# Patient Record
Sex: Male | Born: 1935
Health system: Southern US, Community
[De-identification: ages and names within clinical notes are randomized; demographics above are authoritative.]

## PROBLEM LIST (undated history)

## (undated) DIAGNOSIS — E119 Type 2 diabetes mellitus without complications: Secondary | ICD-10-CM

## (undated) DIAGNOSIS — R0600 Dyspnea, unspecified: Secondary | ICD-10-CM

## (undated) DIAGNOSIS — C189 Malignant neoplasm of colon, unspecified: Secondary | ICD-10-CM

## (undated) DIAGNOSIS — M109 Gout, unspecified: Secondary | ICD-10-CM

## (undated) DIAGNOSIS — E7800 Pure hypercholesterolemia, unspecified: Secondary | ICD-10-CM

## (undated) DIAGNOSIS — M1990 Unspecified osteoarthritis, unspecified site: Secondary | ICD-10-CM

## (undated) DIAGNOSIS — R262 Difficulty in walking, not elsewhere classified: Secondary | ICD-10-CM

## (undated) DIAGNOSIS — C61 Malignant neoplasm of prostate: Secondary | ICD-10-CM

## (undated) DIAGNOSIS — D649 Anemia, unspecified: Secondary | ICD-10-CM

## (undated) DIAGNOSIS — Z9289 Personal history of other medical treatment: Secondary | ICD-10-CM

## (undated) DIAGNOSIS — K922 Gastrointestinal hemorrhage, unspecified: Secondary | ICD-10-CM

## (undated) DIAGNOSIS — L97509 Non-pressure chronic ulcer of other part of unspecified foot with unspecified severity: Secondary | ICD-10-CM

## (undated) DIAGNOSIS — I10 Essential (primary) hypertension: Secondary | ICD-10-CM

## (undated) HISTORY — PX: PROSTATE BIOPSY: SHX241

## (undated) HISTORY — DX: Essential (primary) hypertension: I10

## (undated) HISTORY — PX: JOINT REPLACEMENT: SHX530

## (undated) HISTORY — PX: COLECTOMY: SHX59

## (undated) HISTORY — DX: Difficulty in walking, not elsewhere classified: R26.2

## (undated) HISTORY — DX: Non-pressure chronic ulcer of other part of unspecified foot with unspecified severity: L97.509

## (undated) HISTORY — DX: Gout, unspecified: M10.9

## (undated) HISTORY — PX: CATARACT EXTRACTION W/ INTRAOCULAR LENS  IMPLANT, BILATERAL: SHX1307

---

## 2000-08-23 ENCOUNTER — Ambulatory Visit (HOSPITAL_COMMUNITY): Admission: RE | Admit: 2000-08-23 | Discharge: 2000-08-23 | Payer: Self-pay | Admitting: Family Medicine

## 2000-08-23 ENCOUNTER — Encounter: Payer: Self-pay | Admitting: Family Medicine

## 2001-05-29 ENCOUNTER — Ambulatory Visit (HOSPITAL_COMMUNITY): Admission: RE | Admit: 2001-05-29 | Discharge: 2001-05-29 | Payer: Self-pay | Admitting: Gastroenterology

## 2001-05-30 ENCOUNTER — Ambulatory Visit (HOSPITAL_COMMUNITY): Admission: RE | Admit: 2001-05-30 | Discharge: 2001-05-30 | Payer: Self-pay | Admitting: Gastroenterology

## 2001-05-30 ENCOUNTER — Encounter: Payer: Self-pay | Admitting: Gastroenterology

## 2002-01-21 ENCOUNTER — Encounter: Payer: Self-pay | Admitting: Family Medicine

## 2002-01-21 ENCOUNTER — Encounter: Admission: RE | Admit: 2002-01-21 | Discharge: 2002-01-21 | Payer: Self-pay | Admitting: Family Medicine

## 2002-01-23 ENCOUNTER — Encounter (HOSPITAL_COMMUNITY): Admission: RE | Admit: 2002-01-23 | Discharge: 2002-04-23 | Payer: Self-pay | Admitting: Family Medicine

## 2002-01-24 ENCOUNTER — Encounter: Payer: Self-pay | Admitting: Gastroenterology

## 2002-01-24 ENCOUNTER — Ambulatory Visit (HOSPITAL_COMMUNITY): Admission: RE | Admit: 2002-01-24 | Discharge: 2002-01-24 | Payer: Self-pay | Admitting: Gastroenterology

## 2002-02-07 ENCOUNTER — Inpatient Hospital Stay (HOSPITAL_COMMUNITY): Admission: RE | Admit: 2002-02-07 | Discharge: 2002-02-14 | Payer: Self-pay | Admitting: General Surgery

## 2002-02-07 ENCOUNTER — Encounter (INDEPENDENT_AMBULATORY_CARE_PROVIDER_SITE_OTHER): Payer: Self-pay | Admitting: *Deleted

## 2002-02-13 ENCOUNTER — Encounter: Payer: Self-pay | Admitting: General Surgery

## 2002-03-10 ENCOUNTER — Encounter: Payer: Self-pay | Admitting: General Surgery

## 2002-03-10 ENCOUNTER — Ambulatory Visit (HOSPITAL_BASED_OUTPATIENT_CLINIC_OR_DEPARTMENT_OTHER): Admission: RE | Admit: 2002-03-10 | Discharge: 2002-03-10 | Payer: Self-pay | Admitting: General Surgery

## 2002-03-16 ENCOUNTER — Emergency Department (HOSPITAL_COMMUNITY): Admission: EM | Admit: 2002-03-16 | Discharge: 2002-03-16 | Payer: Self-pay | Admitting: Emergency Medicine

## 2002-04-06 ENCOUNTER — Emergency Department (HOSPITAL_COMMUNITY): Admission: EM | Admit: 2002-04-06 | Discharge: 2002-04-06 | Payer: Self-pay | Admitting: Emergency Medicine

## 2002-04-06 ENCOUNTER — Encounter: Payer: Self-pay | Admitting: Emergency Medicine

## 2002-04-09 ENCOUNTER — Encounter: Payer: Self-pay | Admitting: Hematology & Oncology

## 2002-04-09 ENCOUNTER — Inpatient Hospital Stay (HOSPITAL_COMMUNITY): Admission: RE | Admit: 2002-04-09 | Discharge: 2002-04-12 | Payer: Self-pay | Admitting: Hematology & Oncology

## 2002-04-12 ENCOUNTER — Encounter: Payer: Self-pay | Admitting: Oncology

## 2002-08-25 ENCOUNTER — Encounter: Payer: Self-pay | Admitting: Hematology & Oncology

## 2002-08-25 ENCOUNTER — Ambulatory Visit (HOSPITAL_COMMUNITY): Admission: RE | Admit: 2002-08-25 | Discharge: 2002-08-25 | Payer: Self-pay | Admitting: Hematology & Oncology

## 2002-12-03 ENCOUNTER — Encounter: Payer: Self-pay | Admitting: Hematology & Oncology

## 2002-12-03 ENCOUNTER — Ambulatory Visit (HOSPITAL_COMMUNITY): Admission: RE | Admit: 2002-12-03 | Discharge: 2002-12-03 | Payer: Self-pay | Admitting: Hematology & Oncology

## 2003-01-28 ENCOUNTER — Ambulatory Visit (HOSPITAL_COMMUNITY): Admission: RE | Admit: 2003-01-28 | Discharge: 2003-01-28 | Payer: Self-pay | Admitting: Gastroenterology

## 2003-02-11 ENCOUNTER — Ambulatory Visit (HOSPITAL_COMMUNITY): Admission: RE | Admit: 2003-02-11 | Discharge: 2003-02-11 | Payer: Self-pay | Admitting: Hematology & Oncology

## 2003-02-11 ENCOUNTER — Encounter: Payer: Self-pay | Admitting: Hematology & Oncology

## 2003-04-03 ENCOUNTER — Encounter: Payer: Self-pay | Admitting: Hematology & Oncology

## 2003-04-03 ENCOUNTER — Ambulatory Visit (HOSPITAL_COMMUNITY): Admission: RE | Admit: 2003-04-03 | Discharge: 2003-04-03 | Payer: Self-pay | Admitting: Hematology & Oncology

## 2003-04-12 ENCOUNTER — Encounter: Payer: Self-pay | Admitting: *Deleted

## 2003-04-12 ENCOUNTER — Inpatient Hospital Stay (HOSPITAL_COMMUNITY): Admission: EM | Admit: 2003-04-12 | Discharge: 2003-04-15 | Payer: Self-pay | Admitting: Emergency Medicine

## 2003-04-13 ENCOUNTER — Encounter: Payer: Self-pay | Admitting: *Deleted

## 2003-04-13 ENCOUNTER — Encounter (INDEPENDENT_AMBULATORY_CARE_PROVIDER_SITE_OTHER): Payer: Self-pay | Admitting: Cardiology

## 2003-04-30 ENCOUNTER — Ambulatory Visit (HOSPITAL_COMMUNITY): Admission: RE | Admit: 2003-04-30 | Discharge: 2003-04-30 | Payer: Self-pay | Admitting: Hematology & Oncology

## 2003-04-30 ENCOUNTER — Encounter: Payer: Self-pay | Admitting: Hematology & Oncology

## 2003-08-04 ENCOUNTER — Ambulatory Visit (HOSPITAL_COMMUNITY): Admission: RE | Admit: 2003-08-04 | Discharge: 2003-08-04 | Payer: Self-pay | Admitting: Hematology & Oncology

## 2003-12-03 ENCOUNTER — Ambulatory Visit (HOSPITAL_COMMUNITY): Admission: RE | Admit: 2003-12-03 | Discharge: 2003-12-03 | Payer: Self-pay | Admitting: Hematology & Oncology

## 2004-04-05 ENCOUNTER — Ambulatory Visit: Admission: RE | Admit: 2004-04-05 | Discharge: 2004-05-02 | Payer: Self-pay | Admitting: Radiation Oncology

## 2004-04-13 ENCOUNTER — Ambulatory Visit (HOSPITAL_COMMUNITY): Admission: RE | Admit: 2004-04-13 | Discharge: 2004-04-13 | Payer: Self-pay | Admitting: Hematology & Oncology

## 2004-05-12 ENCOUNTER — Emergency Department (HOSPITAL_COMMUNITY): Admission: EM | Admit: 2004-05-12 | Discharge: 2004-05-12 | Payer: Self-pay | Admitting: Emergency Medicine

## 2004-08-03 ENCOUNTER — Ambulatory Visit (HOSPITAL_COMMUNITY): Admission: RE | Admit: 2004-08-03 | Discharge: 2004-08-03 | Payer: Self-pay | Admitting: Hematology & Oncology

## 2004-08-10 ENCOUNTER — Ambulatory Visit: Payer: Self-pay | Admitting: Hematology & Oncology

## 2004-08-24 ENCOUNTER — Ambulatory Visit: Admission: RE | Admit: 2004-08-24 | Discharge: 2004-11-22 | Payer: Self-pay | Admitting: Radiation Oncology

## 2004-11-21 ENCOUNTER — Ambulatory Visit: Payer: Self-pay | Admitting: Hematology & Oncology

## 2004-11-22 ENCOUNTER — Ambulatory Visit (HOSPITAL_COMMUNITY): Admission: RE | Admit: 2004-11-22 | Discharge: 2004-11-22 | Payer: Self-pay | Admitting: Oncology

## 2004-11-23 ENCOUNTER — Ambulatory Visit (HOSPITAL_COMMUNITY): Admission: RE | Admit: 2004-11-23 | Discharge: 2004-11-23 | Payer: Self-pay | Admitting: Oncology

## 2004-12-15 ENCOUNTER — Ambulatory Visit: Admission: RE | Admit: 2004-12-15 | Discharge: 2004-12-15 | Payer: Self-pay | Admitting: Radiation Oncology

## 2005-03-06 ENCOUNTER — Encounter: Admission: RE | Admit: 2005-03-06 | Discharge: 2005-03-06 | Payer: Self-pay | Admitting: Family Medicine

## 2005-03-27 ENCOUNTER — Ambulatory Visit: Payer: Self-pay | Admitting: Hematology & Oncology

## 2005-04-04 ENCOUNTER — Ambulatory Visit (HOSPITAL_COMMUNITY): Admission: RE | Admit: 2005-04-04 | Discharge: 2005-04-04 | Payer: Self-pay | Admitting: Hematology & Oncology

## 2005-08-08 ENCOUNTER — Ambulatory Visit: Payer: Self-pay | Admitting: Hematology & Oncology

## 2005-08-11 ENCOUNTER — Ambulatory Visit (HOSPITAL_COMMUNITY): Admission: RE | Admit: 2005-08-11 | Discharge: 2005-08-11 | Payer: Self-pay | Admitting: Hematology & Oncology

## 2005-12-22 ENCOUNTER — Ambulatory Visit: Payer: Self-pay | Admitting: Hematology & Oncology

## 2005-12-26 ENCOUNTER — Ambulatory Visit (HOSPITAL_COMMUNITY): Admission: RE | Admit: 2005-12-26 | Discharge: 2005-12-26 | Payer: Self-pay | Admitting: Hematology & Oncology

## 2006-05-02 ENCOUNTER — Ambulatory Visit: Payer: Self-pay | Admitting: Hematology & Oncology

## 2006-05-04 LAB — CBC WITH DIFFERENTIAL/PLATELET
BASO%: 0.6 % (ref 0.0–2.0)
EOS%: 3.8 % (ref 0.0–7.0)
LYMPH%: 15.3 % (ref 14.0–48.0)
MCH: 28.6 pg (ref 28.0–33.4)
MONO%: 8.7 % (ref 0.0–13.0)
NEUT#: 4 10*3/uL (ref 1.5–6.5)
NEUT%: 71.6 % (ref 40.0–75.0)
Platelets: 219 10*3/uL (ref 145–400)

## 2006-05-04 LAB — COMPREHENSIVE METABOLIC PANEL
Albumin: 4.2 g/dL (ref 3.5–5.2)
Alkaline Phosphatase: 75 U/L (ref 39–117)
BUN: 26 mg/dL — ABNORMAL HIGH (ref 6–23)
CO2: 21 mEq/L (ref 19–32)
Calcium: 9.1 mg/dL (ref 8.4–10.5)
Creatinine, Ser: 1.52 mg/dL — ABNORMAL HIGH (ref 0.40–1.50)
Potassium: 4.5 mEq/L (ref 3.5–5.3)
Sodium: 141 mEq/L (ref 135–145)

## 2006-08-28 ENCOUNTER — Ambulatory Visit: Payer: Self-pay | Admitting: Hematology & Oncology

## 2006-08-31 LAB — CBC WITH DIFFERENTIAL/PLATELET
EOS%: 4.1 % (ref 0.0–7.0)
Eosinophils Absolute: 0.3 10*3/uL (ref 0.0–0.5)
MCHC: 31.5 g/dL — ABNORMAL LOW (ref 32.0–35.9)
MONO#: 0.6 10*3/uL (ref 0.1–0.9)
RDW: 11.9 % (ref 11.2–14.6)
lymph#: 1.3 10*3/uL (ref 0.9–3.3)

## 2006-08-31 LAB — CEA: CEA: 2.4 ng/mL (ref 0.0–5.0)

## 2006-08-31 LAB — PSA: PSA: 0.66 ng/mL (ref 0.10–4.00)

## 2006-08-31 LAB — COMPREHENSIVE METABOLIC PANEL
AST: 16 U/L (ref 0–37)
Albumin: 4.4 g/dL (ref 3.5–5.2)
Alkaline Phosphatase: 91 U/L (ref 39–117)
CO2: 22 mEq/L (ref 19–32)
Calcium: 9.5 mg/dL (ref 8.4–10.5)
Chloride: 105 mEq/L (ref 96–112)
Potassium: 4 mEq/L (ref 3.5–5.3)
Total Bilirubin: 0.6 mg/dL (ref 0.3–1.2)

## 2006-08-31 LAB — LACTATE DEHYDROGENASE: LDH: 187 U/L (ref 94–250)

## 2007-02-27 ENCOUNTER — Ambulatory Visit: Payer: Self-pay | Admitting: Hematology & Oncology

## 2007-03-01 LAB — CBC WITH DIFFERENTIAL/PLATELET
Basophils Absolute: 0 10*3/uL (ref 0.0–0.1)
HCT: 36.1 % — ABNORMAL LOW (ref 38.7–49.9)
HGB: 12.1 g/dL — ABNORMAL LOW (ref 13.0–17.1)
MCHC: 33.5 g/dL (ref 32.0–35.9)
MCV: 84.5 fL (ref 81.6–98.0)
NEUT#: 4.2 10*3/uL (ref 1.5–6.5)
Platelets: 236 10*3/uL (ref 145–400)
RDW: 13.5 % (ref 11.2–14.6)
WBC: 6.4 10*3/uL (ref 4.0–10.0)

## 2007-03-01 LAB — COMPREHENSIVE METABOLIC PANEL
ALT: 21 U/L (ref 0–53)
AST: 14 U/L (ref 0–37)
Albumin: 4.5 g/dL (ref 3.5–5.2)
Alkaline Phosphatase: 92 U/L (ref 39–117)
BUN: 31 mg/dL — ABNORMAL HIGH (ref 6–23)
Creatinine, Ser: 1.65 mg/dL — ABNORMAL HIGH (ref 0.40–1.50)
Glucose, Bld: 202 mg/dL — ABNORMAL HIGH (ref 70–99)
Total Bilirubin: 0.7 mg/dL (ref 0.3–1.2)

## 2007-03-01 LAB — CEA: CEA: 2.7 ng/mL (ref 0.0–5.0)

## 2007-08-28 ENCOUNTER — Ambulatory Visit: Payer: Self-pay | Admitting: Hematology & Oncology

## 2007-08-30 LAB — COMPREHENSIVE METABOLIC PANEL
ALT: 25 U/L (ref 0–53)
Albumin: 4.5 g/dL (ref 3.5–5.2)
CO2: 24 mEq/L (ref 19–32)
Chloride: 106 mEq/L (ref 96–112)
Glucose, Bld: 151 mg/dL — ABNORMAL HIGH (ref 70–99)
Potassium: 4 mEq/L (ref 3.5–5.3)
Sodium: 140 mEq/L (ref 135–145)
Total Protein: 7.5 g/dL (ref 6.0–8.3)

## 2007-08-30 LAB — CBC WITH DIFFERENTIAL/PLATELET
EOS%: 3.6 % (ref 0.0–7.0)
Eosinophils Absolute: 0.2 10*3/uL (ref 0.0–0.5)
HCT: 38.8 % (ref 38.7–49.9)
HGB: 12.7 g/dL — ABNORMAL LOW (ref 13.0–17.1)
MCV: 86.1 fL (ref 81.6–98.0)
NEUT#: 4.4 10*3/uL (ref 1.5–6.5)
NEUT%: 69.3 % (ref 40.0–75.0)
RBC: 4.5 10*6/uL (ref 4.20–5.71)
WBC: 6.3 10*3/uL (ref 4.0–10.0)

## 2007-08-30 LAB — PSA: PSA: 0.41 ng/mL (ref 0.10–4.00)

## 2007-08-30 LAB — CEA: CEA: 2.2 ng/mL (ref 0.0–5.0)

## 2007-09-14 ENCOUNTER — Encounter: Admission: RE | Admit: 2007-09-14 | Discharge: 2007-09-14 | Payer: Self-pay | Admitting: Family Medicine

## 2008-04-03 ENCOUNTER — Encounter: Admission: RE | Admit: 2008-04-03 | Discharge: 2008-04-03 | Payer: Self-pay | Admitting: Family Medicine

## 2011-02-10 NOTE — Procedures (Signed)
[facility]  Patient:    Damon Snyder, Damon Snyder Visit Number: [id number] MRN: [id number]          Service Type: Attending:  Verlin Grills, M.D. Proc. Date: 05/29/01   CC:         Desma Maxim, M.D.   Procedure Report  PROCEDURE:  Colonoscopy to the hepatic flexure.  REFERRING PHYSICIAN:  Desma Maxim, M.D.  PROCEDURE INDICATION:  Mr. [name] (date of birth 05/19/2028) is a 77 year old male with Hemoccult positive stool.  Mr. Ellis is scheduled to undergo his first surveillance colonoscopy with polypectomy to prevent colon cancer.  ENDOSCOPIST:  Verlin Grills, M.D.  PREMEDICATION:  Versed 5 mg, Demerol 50 mg.  ENDOSCOPE:  Olympus pediatric colonoscope.  DESCRIPTION OF PROCEDURE:  After obtaining informed consent, Mr. Arana was placed in the left lateral decubitus position.  I administered intravenous Demerol and intravenous Versed to achieve conscious sedation for the procedure.  The patients blood pressure, oxygen saturations, and cardiac rhythm were monitored throughout the procedure and documented in the medical record.  Anal inspection was normal.  Digital rectal exam revealed a nonnodular prostate.  The Olympus pediatric video colonoscope was introduced into the rectum and easily advanced to the hepatic flexure.  Due to colonic loop formation, I was unable to perform a complete colonoscopy.  Colonic preparation for the exam today was satisfactory.  RECTUM:  Normal, large nonbleeding internal hemorrhoids are present.  SIGMOID COLON AND DESCENDING COLON:  Normal.  SPLENIC FLEXURE:  Normal.  TRANSVERSE COLON:  Normal.  HEPATIC FLEXURE:  Normal.  ASSESSMENT: 1. Large, nonbleeding, internal hemorrhoids. 2. Otherwise normal proctocolonoscopy to the hepatic flexure.  RECOMMENDATIONS:  I will schedule Mr. Hollomon for an air contrast barium enema to be performed at [facility] May 30, 2001. Attending:   Verlin Grills, M.D. DD:  05/29/01 TD:  05/29/01 Job: [id number]

## 2011-02-10 NOTE — Op Note (Signed)
   NAME:  Damon Snyder, Damon Snyder                           ACCOUNT NO.:  [id number]   MEDICAL RECORD NO.:  [id number]                   PATIENT TYPE:  INP   LOCATION:  4737                                 FACILITY:  [facility]   PHYSICIAN:  Gita Kudo, M.D.              DATE OF BIRTH:  1927-11-23   DATE OF PROCEDURE:  04/14/2003  DATE OF DISCHARGE:                                 OPERATIVE REPORT   PREOPERATIVE DIAGNOSIS:  Indwelling venous access system, used for  chemotherapy for colon cancer and longer needed.   POSTOPERATIVE DIAGNOSIS:  Indwelling venous access system, used for  chemotherapy for colon cancer and longer needed.   OPERATION PERFORMED:  Removal of indwelling venous access system--left  subclavian.   SURGEON:  Gita Kudo, M.D.\   ANESTHESIA:  MAC--IV sedation, local 1% Xylocaine.   DESCRIPTION OF PROCEDURE:  Under satisfactory intravenous sedation, the  patient was positioned and prepped and draped in a standard fashion.  30mL  of 1% Xylocaine was infiltrated during the procedure for excellent  analgesia.  The previous incision was reopened in a transverse direction and  carried down to the capsule of the reservoir.  The capsule was opened and  the reservoir grasped and tissue ingrowth through the holes and sutures  through the holes were cut and the reservoir freed.  Then the reservoir and  attached catheter were slowly withdrawn and noted to be intact.  There were  no complications.  Minimal blood loss.  The wound lavaged with saline and  closed in layers with 3-0 and 4-0 Vicryl and Steri-Strips.  Sterile  absorbent dressing applied and the patient went to the recovery room in good  condition.                                                Gita Kudo, M.D.    MRL/MEDQ  D:  04/14/2003  T:  04/14/2003  Job:  [id number]   cc:   Ermalinda Memos. Gaye Pollack, M.D.  Fax: [phone]

## 2011-02-10 NOTE — Discharge Summary (Signed)
NAME:  Damon Snyder, Damon Snyder                           ACCOUNT NO.:  [id number]   MEDICAL RECORD NO.:  [id number]                   PATIENT TYPE:  INP   LOCATION:  4737                                 FACILITY:  [facility]   PHYSICIAN:  Lilyan Punt. Sydnee Levans, M.D.             DATE OF BIRTH:  May 03, 1928   DATE OF ADMISSION:  04/12/2003  DATE OF DISCHARGE:  04/15/2003                                 DISCHARGE SUMMARY   DIAGNOSES:  1. Syncope x 2 thought secondary to dehydration and renal insufficiency     which resolved with treatment with IV fluids and stoppage of one of his     blood pressure medications: Diovan 300 mg.  2. Dehydration, resolved.  3. Renal insufficiency, resolved. Creatinine on discharge 1.2.  4. Carotid disease with carotid Dopplers 04/13/2003, that showed no     significant internal carotid artery stenosis but right-sided mild     heterogenous plaque in the carotid artery extending into the proximal     internal carotid artery and external carotid artery.  5. Diabetes, type 2.  6. Hypertension.  7. Dyslipidemia.  8. Anemia, normocytic, likely anemia of chronic disease.  9. History of colon cancer and oncologic followup.  10.      Port-A-Cath insertion with subsequent removal this admission by Dr.     Maryagnes Amos.  This procedure had originally been scheduled as an outpatient     during this time period.   MEDICATIONS:  1. Plavix 75 mg daily.  2. Aspirin 81 mg daily.  3. Norvasc 10 mg daily.  4. Vitelle daily.  5. Lotensin 20 mg daily.  6. Lipitor 10 mg daily.  7. Amaryl 6 mg daily.   DISCONTINUED MEDICATIONS:  Diovan.   FOLLOW UP:  Dr. Arvilla Market in two weeks, Dr. Myna Hidalgo in early August as  previously scheduled.  Will see Dr. Maryagnes Amos in a month.   LABORATORY DATA:  Pending tests at discharge:  Hemoglobin A1C level.   HOSPITAL COURSE:  The patient was admitted by Dr. 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  He  was found  to be hypertensive, volume depleted, with orthostatic hypotension  as well as renal insufficiency.   The patient was admitted for IV hydration. Diovan was held.  His other  medicines were continued.  He underwent head CT that showed no evidence of  acute cerebrovascular accident, bleeding, or tumor.  There was discussion of  possible mild small vessel disease in the hemispheric white matter per  radiologist report.  Chest x-ray revealed hyperaeration with bibasilar  scarring, particularly on the right but essentially no active disease.  His  carotid Dopplers were done as discussed above, and management was discussed  with Plavix and aspirin having been started as inpatient.   He had several elevated blood glucose levels initially.  These normalized as  his activity increased and  his diet was resumed.  There were no hypoglycemic  events during hospitalization.  The patient did relate having missed  breakfast on the day that he experienced syncope which raises suspicion for  hypoglycemic episode.  Hemoglobin A1C level is pending at this time.   He is discharged today, having met discharge criteria and in much improved  condition.   PROCEDURE:  04/14/2003 surgery. See also operative note by Dr. Maryagnes Amos.  Under  IV sedation, removal of Port-A-Cath with no complications.                                                 Lilyan Punt Sydnee Levans, M.D.    KCS/MEDQ  D:  04/15/2003  T:  04/15/2003  Job:  [id number]   cc:   Donia Guiles, M.D.  [address] [place]  [place] [address]  Fax: [phone]   Rose Phi. Myna Hidalgo, M.D.  [address] [facility]  [place], [place] [address]  Fax: [phone]   Gita Kudo, M.D.  [address]  [place]  [place] [address]  Fax: [phone]    cc:   Donia Guiles, M.D.  [address] [place]  [place] [address]  Fax: [phone]   Rose Phi. Myna Hidalgo, M.D.  [address] [facility]  [place], [place] [address]  Fax: [phone]   Gita Kudo, M.D.   [address]  [place]  [place] [address]  Fax: [phone]

## 2011-02-10 NOTE — Discharge Summary (Signed)
[facility]  Patient:    Damon Snyder, Damon Snyder Visit Number: [id number] MRN: [id number]          Service Type: SUR Location: 1S X009 01 Attending Physician:  Tempie Donning Dictated by:   Gita Kudo, M.D. Admit Date:  02/07/2002 Discharge Date: 02/14/2002   CC:         Desma Maxim, M.D.  Rose Phi. Myna Hidalgo, M.D.  Charolett Bumpers III, M.D.   Discharge Summary  CHIEF COMPLAINT:  Lesion.  HISTORY OF PRESENT ILLNESS:  This is a 77 year old man admitted for elective colon resection.  Because of anemia, colonoscopy was performed to the distal transverse colon and could no further.  A BE showed constricting lesion consistent with cancer.  He is given an outpatient bowel prep and comes in for operation.  Operative findings of pathology showed poorly-differentiated adenocarcinoma at the ileocecal valve extending into visceral peritoneum. Margins were free with 12/14 lymph nodes involved with metastatic disease.  LABORATORY DATA AND X-RAY FINDINGS:  Initial hemoglobin was 9.4, hematocrit 30, postoperative hemoglobin 9.5 and hematocrit 31.  CMET normal except for elevated glucose (the patient is diabetic).  His potassium postop was 6.5, but repeat showed this to be normal at 5.3.  He was B positive and required no transfusion.  CT scan postop showed no sign of metastatic disease.  The chest x-ray preop showed no acute pulmonary disease.  EKG interpreted as sinus rhythm.  HOSPITAL COURSE:  On the morning of admission, the patient underwent a right colectomy.  He was a large man and the surgery went well, although exposure was somewhat difficult.  Postoperatively, he did well.  His nasogastric and Foley catheters were removed on schedule.  He regained bowel function.  He was ambulatory and had no significant problems.  He was seen in consultation by Dr. Arlan Organ. The consultation was further oncologic evaluation and treatment.  Of note,  is the fact that a preop CEA was 65.2.  After the postop CT was obtained on postop day #6, he improved to the point and was discharged on postop day #7.  He was allowed to go home.  DIET:  Regular.  ACTIVITY:  Limited.  DISCHARGE MEDICATIONS: 1. Previous medications. 2. Vicodin for pain.  FOLLOWUP:  He will be seen by Dr. Maryagnes Amos and Dr. Myna Hidalgo as outpatient.  DISCHARGE DIAGNOSIS:  Carcinoma of right colon, stage T4 N2.  COMPLICATIONS/INFECTIONS:  None.  CONSULTATION:  Dr. Arlan Organ.  PROCEDURE:  Right colectomy.  CONDITION ON DISCHARGE:  Stable. Dictated by:   Gita Kudo, M.D. Attending Physician:  Tempie Donning DD:  02/14/02 TD:  02/17/02 Job: [id number]

## 2011-02-10 NOTE — Op Note (Signed)
   NAME:  Damon Snyder, Bushey                           ACCOUNT NO.:  [id number]   MEDICAL RECORD NO.:  [id number]                   PATIENT TYPE:  AMB   LOCATION:  ENDO                                 FACILITY:  [facility]   PHYSICIAN:  Danise Edge, M.D.                DATE OF BIRTH:  07/06/1928   DATE OF PROCEDURE:  01/28/2003  DATE OF DISCHARGE:                                 OPERATIVE REPORT   PROCEDURE:  Screening colonoscopy.   INDICATIONS:  The patient is a 77 year old male born Nov 09, 2027.  Approximately  one year ago the patient required surgery to remove an adenocarcinoma of the  cecum.  The patient is scheduled to undergo a repeat surveillance  colonoscopy with polypectomy to prevent recurrent colon cancer.   ENDOSCOPIST:  Danise Edge, M.D.   PREMEDICATION:  Versed 5 mg, Demerol 50 mg.   DESCRIPTION OF PROCEDURE:  After obtaining informed consent, the patient was  placed in the left lateral decubitus position.  I administered intravenous  Demerol and intravenous Versed to achieve conscious sedation for the  procedure.  The patient's blood pressure, oxygen saturation, and cardiac  rhythm were monitored throughout the procedure and documented in the medical  record.   Anal inspection was normal.  Digital rectal exam was normal.  The prostate  was non-nodular.  The Olympus adult colonoscope was introduced into the  rectum and advanced to the ileo-right colonic surgical anastomosis.  Colonic  preparation for the exam today was excellent.   Rectum normal.   Sigmoid colon and descending colon normal.   Splenic flexure normal.   Transverse colon normal.   Hepatic flexure normal.   Ileo-right colonic surgical anastomosis normal.    ASSESSMENT:  Normal proctocolonoscopy to the ileo-right colonic surgical  anastomosis post colon cancer surgery to remove an adenocarcinoma of the  cecum.   RECOMMENDATIONS:  Repeat colonoscopy in three years.                     Danise Edge, M.D.    MJ/MEDQ  D:  01/28/2003  T:  01/28/2003  Job:  [id number]   cc:   Gita Kudo, M.D.  [address]  [place]  [place] [address]  Fax: [phone]   Donia Guiles, M.D.  [address] [place]  [place] [address]  Fax: [phone]

## 2011-02-10 NOTE — Procedures (Signed)
[facility]  Patient:    Damon Snyder, Damon Snyder Visit Number: [id number] MRN: [id number]          Service Type: END Location: ENDO Attending Physician:  Dennison Bulla Ii Dictated by:   Verlin Grills, M.D. Proc. Date: 01/24/02 Admit Date:  01/24/2002 Discharge Date: 01/24/2002   CC:         Desma Maxim, M.D.   Procedure Report  PROCEDURE:  Esophagogastroduodenoscopy and proctocolonoscopy to the splenic flexure.  REFERRING PHYSICIAN:  Desma Maxim, M.D.  PROCEDURE INDICATION:  [name]. Tuman is a 77 year old male, born [birth year].  [name] has unexplained iron deficiency anemia.  On May 29, 2001, he underwent a proctocolonoscopy to the hepatic flexure followed by air contrast barium enema which was normal except for the presence of large but nonbleeding internal hemorrhoids.  On January 21, 2002, [name] fasting complete metabolic profile was normal. His hemoglobin was 7.4 g.  His serum ferritin was 2.  Mr. Busche received two units of packed red blood cell transfusion yesterday.  ENDOSCOPIST:  Verlin Grills, M.D.  PREMEDICATION:  Versed 10 mg, Demerol 50 mg.  ENDOSCOPE:  Olympus gastroscope and pediatric colonoscope.  PROCEDURE:  Esophagogastroduodenoscopy.  DESCRIPTION OF PROCEDURE:  After obtaining informed consent, [name] was placed in the left lateral decubitus position.  I administered intravenous Demerol and intravenous Versed to achieve conscious sedation for the procedure.  The patients blood pressure, oxygen saturation, and cardiac rhythm were monitored throughout the procedure and documented in the medical record.  The Olympus gastroscope was passed through the posterior hypopharynx into the proximal esophagus without difficulty.  The hypopharynx, larynx, and vocal cords appeared normal.  Esophagoscopy:  The proximal, mid, and lower segments of the esophagus appear normal.  Gastroscopy:   Retroflexed view of the gastric cardia and fundus reveals a patulous diaphragmatic hiatus with normal-appearing gastric fundus and cardia. The gastric body appears normal.  There was a scant amount of coffee-ground fluid in the gastric antrum without an identifiable bleeding source.  The gastric antrum and pylorus otherwise appeared normal.  Duodenoscopy:  The duodenal bulb, mid duodenum, and distal duodenum appear normal.  ASSESSMENT:  Patulous diaphragmatic hiatus; otherwise normal esophagogastroduodenoscopy.  No signs of upper gastrointestinal bleeding.  PROCEDURE:  Proctocolonoscopy to the splenic flexure.  DESCRIPTION OF PROCEDURE:  Anal inspection was normal.  Digital rectal exam was normal.  The Olympus pediatric video colonoscope was introduced into the rectum, and I could only advance the endoscope to the splenic flexure.  Due to colon loop formation, I was unable to complete a full colonoscopy.  Endoscopic appearance of the rectum, sigmoid colon, descending colon, and splenic flexure was normal.  There were no signs of lower gastrointestinal bleeding.  RECOMMENDATIONS:  [name] will be referred to the radiology suite for a second air contrast barium enema.  His air contrast barium enema last year was limited due to the presence of residual stool. Dictated by:   Verlin Grills, M.D. Attending Physician:  Dennison Bulla Ii DD:  01/24/02 TD:  01/26/02 Job: [id number]

## 2011-02-10 NOTE — H&P (Signed)
NAME:  Damon Snyder, Damon Snyder                           ACCOUNT NO.:  [id number]   MEDICAL RECORD NO.:  [id number]                   PATIENT TYPE:  INP   LOCATION:  4737                                 FACILITY:  [facility]   PHYSICIAN:  Ermalinda Memos. Gaye Pollack, M.D.            DATE OF BIRTH:  1927/10/08   DATE OF ADMISSION:  04/12/2003  DATE OF DISCHARGE:                                HISTORY & PHYSICAL   CHIEF COMPLAINT:  Syncope.   HISTORY OF PRESENT ILLNESS:  Mr. 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  He had an episode of lightheaded  diaphoresis and malaise and EMS was called.  He was evaluated but refused to  come in, got up and ambulated and felt well.  After EMS left, he had another  episode of similar symptoms, with lightheadedness, dizziness, diaphoresis,  malaise, fatigue, and some shortness of breath and then had a syncopal  episode.  His wife saw him and witnessed the entire event.  It lasted only a  few seconds.  There was no seizure activity.  He was not incontinent.  He  was brought to the emergency room.  At the time of the evaluation, he had no  complaints, felt well, had no neuro deficits.  He acknowledges that he had  the symptoms his wife had mentioned and specifically denied any chest pain  or palpitations.  He denied having any neuro deficits at the time of the  examination or at the time he awakened from his syncopal episode.   PAST MEDICAL HISTORY:  Hypertension, diabetes, colon cancer, anemia.   PAST SURGICAL HISTORY:  Colonic resection.   ALLERGIES:  No known drug allergies.   CURRENT MEDICATIONS:  1. Atenolol 30 daily.  2. Amaryl 6 daily.  3. Diovan 300 daily.  4. Lipitor 10 daily.  5. Lotensin 20 b.i.d.  6. Norvasc 2 daily.   SOCIAL HISTORY:  He is a Optician, dispensing, married, does not drink or smoke.   FAMILY HISTORY:  Mother is 23 years old and has multiple medical problems.  Father died of a CVA and hypertension age 31, 2  brothers with hypertension,  2 sisters with diabetes.   REVIEW OF SYSTEMS:  He reports neuropathy peripherally and a leaky Port-a-  Cath, otherwise, 12-point review of systems for the patient is negative,  excluding what is mentioned above.   PHYSICAL EXAMINATION:  GENERAL:  Reveals a well-nourished, well-developed,  very pleasant, 77 year old black male lying supine.  He is awake, alert and  oriented x3, pleasant, cooperative, breathing easily on 2 liters of nasal  cannula oxygen.  He is moving all extremities.  SKIN:  Warm and dry.  Color looks good.  HEENT:  Normocephalic and atraumatic.  Eyes:  PERRLA.  Sclerae are  nonicteric.  NECK:  Supple, full range of motion, no JVD, adenopathy, thyromegaly,  masses, bruits.  LUNGS:  Clear  to auscultation.  CARDIAC:  Regular rhythm, normal S1, S2, no S3 or S4, no heaves, rubs or  gallops .  He has 1/6 systolic ejection murmur without radiation.  ABDOMEN:  Soft, nontender, normoactive bowel sounds, no organomegaly or  masses.  EXTREMITIES:  No clubbing, cyanosis or edema.  NEUROLOGIC:  Awake, alert, oriented x3, moving all extremities, following  all commands.  Gross motor, cerebellar and sensory are all intact.  Cranial  nerves II-XII grossly intact.   LABORATORY DATA:  Cardiac enzymes x1 are negative.  EKG is pending.  CT of  the head is pending.  Sodium is 137, potassium is 4.5, chloride is 107,  bicarb is 23, BUN is 33, creatinine is 2.2, glucose is 159.  Hemoglobin  10.7, hematocrit 32.3, white count of 10.3, platelet of 296,000.   ASSESSMENT:  A 77 year old black male with episode of lightheadedness,  diaphoresis, malaise, and shortness of breath x2 without any evidence of  chest pain or palpitations.  Giving his blood pressure of 83/51, it is quite  likely that he is volume depleted and had an episode of orthostatic  hypotension with syncopal collapse.  He is also diabetic, has a history of  colon cancer and has renal insufficiency  probably secondary to hypertension  and diabetes.  He also is anemic by history and his hemoglobin is 10.7 on  this admission.   PLAN:  He will be admitted to the hospital, hydrated.  We will halt the  Diovan, halt the Norvasc for systolics less than 100.  We will check the EKG  when available, get a CT of the head without contrast, place him on  telemetry, get an echocardiogram and carotid ultrasound.  We will go ahead  and consult his surgeon and have his porta cath removed as it is currently  nonfunctional.                                               Ermalinda Memos. Gaye Pollack, M.D.    RMS/MEDQ  D:  04/12/2003  T:  04/13/2003  Job:  [id number]   cc:   Donia Guiles, M.D.  [address] [place]  [place] [address]  Fax: [phone]    cc:   Donia Guiles, M.D.  [address] [place]  [place] [address]  Fax: [phone]

## 2011-02-10 NOTE — Op Note (Signed)
[place]  Patient:    Damon Snyder, Damon Snyder Visit Number: [id number] MRN: [id number]          Service Type: DSU Location: [facility] Attending Physician:  Tempie Donning Dictated by:   Gita Kudo, M.D. Proc. Date: 03/10/02 Admit Date:  03/10/2002   CC:         Theron Arista R. Myna Hidalgo, M.D.  Desma Maxim, M.D.  Charolett Bumpers III, M.D.   Operative Report  OPERATIVE PROCEDURE:  Insertion of [place] venous access system, left subclavian.  SURGEON:  Gita Kudo, M.D.  ANESTHESIA:  MAC- IV sedation local 1% Xylocaine.  PREOPERATIVE DIAGNOSIS:  Postoperative colon resection, needs chemotherapy, for cancer.  POSTOPERATIVE DIAGNOSIS:  Postoperative colon resection, needs chemotherapy, for cancer.  CLINICAL SUMMARY:  Mr. Matera is a 77 year old man who had a bulky tumor of his right colon.  It was resected with good margins, however, multiple nodes were involved.  He is a candidate for chemotherapy and needs venous access for that.  OPERATIVE FINDINGS:  The left subclavian vein was punctured on the first attempt.  Fluoroscopy used throughout and showed that the catheter was in good position.  DESCRIPTION OF PROCEDURE:  Under satisfactory intravenous sedation, having received 1 g of Ancef preoperatively, the patients neck and chest were prepped and draped in a standard fashion.  He was positioned appropriately, and 1% Xylocaine infiltrated throughout for good analgesia.  A left subclavian puncture was made, and the J-wire inserted into the vena cava as confirmed by fluoroscopy.  Then after infiltration, a subcutaneous pocket was made through a transverse incision over the left sternal border, carried down through the subcutaneous into the pectoralis muscle and fascia which were left intact. The pocket was enlarged to accommodate the reservoir and sized appropriately.  Then the puncture site was enlarged, and the subcutaneous tunnel  between the two made with a tunneling device, and the catheter brought through the tunnel. It was cut to the appropriate length, and then connected to the reservoir which was flushed with heparin.  It was then secured to the pectoralis fascia with three #2-0 Prolene sutures.  Following this, the dilator was placed over the J-wire and passed into the subclavian and then withdrawn.  Then the introducer was placed over the dilator, both over the J-wire and into the subclavian.  Fluoroscopy used to confirm the correct location and also the length of the catheter.  Following that, the J-wire and dilator were withdrawn.  The catheter was placed through the introducer and the introducer peeled away, leaving the catheter in good position.  This was confirmed by good blood flow return, as well as by fluoroscopy.  The catheter had good position and laid without kink.  Then the wounds were closed in layers with 3-0 Vicryl and 4-0 Vicryl for the subcutaneous, and Steri-Strips for skin.  Just before closing, the reservoir was accessed with a right angle Huber needle and extension tubing, and this was flushed with concentrated heparin and left in place for chemotherapy later this week.  X-rays will be taken to confirm position and no pneumothorax, in PACU.  The patient will then go home and follow written instructions. Dictated by:   Gita Kudo, M.D. Attending Physician:  Tempie Donning DD:  03/10/02 TD:  03/11/02 Job: [id number]

## 2011-02-10 NOTE — H&P (Signed)
[facility]  Patient:    Damon Snyder, Damon Snyder Visit Number: [id number] MRN: [id number]          Service Type: SUR Location: 4W 0470 01 Attending Physician:  Gita Kudo Md Dictated by:   Gita Kudo, M.D. Admit Date:  02/07/2002                           History and Physical  CHIEF COMPLAINT:  Colon lesion.  HISTORY OF PRESENT ILLNESS: This 77 year old male is brought in for elective right colon resection. He has a history of anemia and has been followed by Drs. Arvilla Market and [employer].  A colonoscopy was performed which was negative to the transversecolon but difficult to go further and, therefore, a barium enema was obtained on Jan 24, 2002.  This showed most likely carcinoma of the right colon.  An upper endoscopy showed no definite abnormality.  A preoperative chest x-ray was negative.  CEA was elevated at 65.  He has had outpatient prep and comes in for resection.  PAST MEDICAL HISTORY: 1. Mild hypertension. 2. Orally controlled diabetes.  He is in good general health.  MEDICATIONS:  Lipitor, Lotensin, Amaryl, Lasix, and Diovan.  PAST SURGICAL HISTORY:  He has never had previous surgery.  ALLERGIES:  No known allergies.  REVIEW OF SYSTEMS:  He is active, retired.  Has no significant GI, GU, cardiopulmonary, or neurologic symptoms.  SOCIAL HISTORY:  Married, retired.  Does not smoke and does not use alcohol.  FAMILY HISTORY:  Positive for a brother with diabetes, andsister had cancer of unknown type.  PHYSICAL EXAMINATION:  GENERAL:  Alert, cooperative male in no apparent distress.  HEENT:  Head normal.  ENT with no obstruction or infection.  NECK:  Supple.  CHEST:  Clear.  LUNGS:  Clear.  HEART:  Regular.  No murmur.  ABDOMEN:  Obese.  No masses or tenderness.  RECTAL:  Deferred, has been done with colonoscopy.  EXTREMITIES:  No deformity or edema.  Good pedal pulses.  IMPRESSION:  Right colon lesion, probable  cancer.  PLAN:  Resection. Dictated by:   Gita Kudo, M.D. Attending Physician:  Gita Kudo Md DD:  02/07/02 TD:  02/09/02 Job: [phone] [id number]

## 2011-02-10 NOTE — Consult Note (Signed)
[facility]  Patient:    Damon Snyder, Damon Snyder Visit Number: [id number] MRN: [id number]          Service Type: SUR Location: 4W 0470 01 Attending Physician:  Gita Kudo Md Dictated by:   Rose Phi. Myna Hidalgo, M.D. Proc. Date: 02/12/02 Admit Date:  02/07/2002   CC:         Gita Kudo, M.D.  MartinK. Belva Crome, M.D.             Desma Maxim, M.D.                          Consultation Report  REFERRING PHYSICIAN:  Gita Kudo, M.D.  REASON FOR CONSULTATION:  Stage III (T4, N2, N0) adenocarcinoma of the right colon.  HISTORY OF PRESENT ILLNESS:  Damon Snyder is a very nice 77 year old African-American gentleman with a past historysignificant for hypertension, hyperlipidemia, and type 2 diabetes.  He has been doing fairly well.  He had not really had much in the way of complaints outside of some abdominal pain. He does have some heme-positive stools.  He was found to have some iron deficiency anemia a year ago.  At that time he underwent a proctocolonoscopy to the hepatic flexure followed by air contrast barium enema, which was unremarkable.  He underwent a repeat CBC back in April 2003.  It was found that his hemoglobin was 74, and his serum creatinine was 2.  He was referred to Damon Snyder, M.D.  Damon Snyder went ahead and did an EGD on him.  The EGD was negative.  A colonoscopy was subsequently done which, unfortunately, could beadvanced to the splenic flexure.  No abnormalities were subsequently noted.  A barium enema was subsequently done. This was done on Jan 24, 2002.This showed an "apple core" lesion involving the right colon and possibly the ileocecal valve.  He was subsequently referred to Damon Snyder.  Damon Snyder felt that a surgical indication was appropriate.  I am not sure if a preop CT scan was done.  A chest x-ray was negative.  A preop CEA level was 65.2.  Damon Snyder took Damon Snyder to surgery on Feb 07, 2002.   Exploration of the abdominal cavity did not reveal any evidence of metastatic disease.  The tumor was resected.  The pathology report ([id number]) showed a poorly-differentiated adenocarcinoma of the ileocecalvalve.  The tumor extends through the bowel wall to involve the visceralperitoneum.  All margins are negative.  There was lymphovascular space invasion.  There was focal mucinous differentiation.  Fourteen lymph nodeswere removed, and 12 were positive for malignancy.  As such, Damon Snyder isa stage III (T4, N2, M0) adenocarcinoma of the colon.  Postoperatively he has done well.  He has had no obvious weight loss.  He denies any obvious change in bowel or bladder habits.  He has had no obviousbleeding.  PAST MEDICAL HISTORY: 1. Hypertension. 2. Hyperlipidemia. 3. Type 2 diabetes.  ALLERGIES:  None.  MEDICATIONS:  Admission medications were Lipitor 10 mg q.d., Lotensin 20/12.5 mg q.d., Diovan 320 mg q.d., Amaryl 4 mg q.d., Lasix 40 mg p.o. b.i.d., clonidine 0.1 mg p.o. b.i.d., atenolol 50 mg q.d., aspirin 81 mg p.o. q.d.  SOCIAL HISTORY:  Negative for tobacco or alcohol use.  He is retired from [employer].  FAMILY HISTORY:  Remarkable for two sisters who had colon cancer.  There is a history of diabetes.  REVIEW OF SYSTEMS:  As stated in the history of present illness.  No additional findings were noted.  PHYSICAL EXAMINATION:  VITAL SIGNS:  Temperature of 97.6, pulse 76, respiratory rate 18, blood pressure 156/70.  GENERAL:  This is a well-developed, well-nourished black gentleman in no obvious distress.  HEENT:  A normocephalic, atraumatic skull.  There is no scleral icterus.  NECK:  No adenopathy is noted in his neck.  CHEST:  Lungs are clear to percussion and auscultation bilaterally.  CARDIAC:  Regular rate and rhythm with normal S1 and S2.  There are no murmurs, rubs, or bruits.  ABDOMEN:  Soft abdomen with good bowel sounds.  He has an abdominal  wound. There is some slight tenderness about the abdominal site.  There is no hepatosplenomegaly.  EXTREMITIES:  No clubbing, cyanosis, or edema.  He has good range of motion of his joints.  SKIN:  No rashes, _____, or petechiae.  NEUROLOGIC:  No focal neurologic deficits.  LABORATORY DATA:  White count is 9.4, hemoglobin 9.4, hematocrit 30.1, patient 485.  Sodium was 135, potassium 6.4, calcium _____, chloride 107, bicarb 25, BUN 17, creatinine1.5, glucose 176.  His LFTs are within normal limits.  IMPRESSION:Damon Snyder is a 77 year old gentleman who is in very good health. His performance status is ECOG 0.  He has a stage IIIA colon cancer.  I would consider him a very high risk for stage III colon cancer.  He has 12 positive lymph nodes.  He has lymphovascular space invasion.  There is some mucinous differentiation.  Also noted was some ulceration.  I suspect that his risk of recurrence is virtually 100% without adjuvant chemotherapy. Even with adjuvant chemotherapy, I think his risk of recurrence is still going to be about 50%.  I think he could tolerate adjuvant chemotherapy.  He does have a couple of other health issues, but these all seem to be fairly well-controlled.  I believe that his mortality is going to be based upon his colon cancer recurrence and not upon the hypertension ordiabetes.  I would plan on treating him with six cycles of the SALTZ regimen consisting of CPT-11/5-FU/leucovorin.  This, I believe, would be considered the standard adjuvant therapy for Damon Snyder.  There has been some excitement recently regarding a new agent which is an endothelial growth factor inhibitor.  This agent is called Avastin. Unfortunately, this likely will not be out for several months.  However, I  suspect that this could be employed for Damon Snyder if and when his cancer does recur.  Damon Snyder certainly knows his situation.  He understands that without chemotherapy, his cancer will  recur.  I suspect that it would recur within a year.  Damon Snyder does agree to undergo chemotherapy.  He will need to have a Port-A-Cath placed.  We will have Damon Snyder place this at the time of chemotherapy.  I will plan to see Mr. Gendron back in the office in the first week of June.  At that point, we will make final plans for his chemotherapy.  I have appreciated the opportunity to see Damon Snyder.  He is a very nice guy. I look forward to being able to help him out. Dictated by:   Rose Phi. Myna Hidalgo, M.D. Attending Physician:  Gita Kudo Md DD:  02/12/02 TD:  02/12/02 Job: [id number]

## 2011-02-10 NOTE — H&P (Signed)
[facility]  Patient:    Damon Snyder, Damon Snyder Visit Number: [id number] MRN: [id number]          Service Type: MED Location: 2S 4782 01 Attending Physician:  Jim Desanctis Dictated by:   Rose Phi. Myna Hidalgo, M.D. Admit Date:  04/09/2002                           History and Physical  REASON FOR ADMISSION: 1. Severe weakness. 2. Stage III colon cancer.  HISTORY OF PRESENT ILLNESS:  The patient is a 77 year old black gentleman with recently diagnosed stage III adenocarcinoma of the colon.  He is receiving weekly chemotherapy.  He is getting CPT-11/5-FU/leucovorin.  He comes in with weakness.  He has had this in his legs.  This has been going on for several days.  He has had pain.  This has been vague in nature.  He has been to the emergency room.  No abnormalities were really found.  He has had no fever.  He is eating okay.  There is no diarrhea.  PAST MEDICAL HISTORY:  See old records.  ALLERGIES:  None.  MEDICATIONS: 1. Amaryl 4 mg p.o. q.d. 2. Lasix 40 mg p.o. q.d. 3. Diovan 320 mg p.o. q.d. 4. Lotensin 20/12.5 mg p.o. q.d. 5. Lipitor 10 mg p.o. q.d. 6. Coumadin 1 mg p.o. q.d.  SOCIAL HISTORY:  No tobacco or alcohol use.  REVIEW OF SYSTEMS:  See history of present illness.  PHYSICAL EXAMINATION:  GENERAL:  Ill-appearing black gentleman in no obvious distress.  VITAL SIGNS:  Temperature 97, pulse 124, heart rate 20, blood pressure 134/72.  HEENT, NECK:  Dry oral mucosa.  He has no ocular or oral lesions.  There are no palpable cervical or supraclavicular lymph nodes.  LUNGS:  Clear bilaterally.  CARDIAC:  Regular rate and rhythm with a normal S1, S2.  There are no murmurs, rubs, or bruits.  ABDOMEN:  Soft.  With good bowel sounds.  There is no palpable abdominal mass. There is no palpable hepatosplenomegaly.  EXTREMITIES:  Shows marked tenderness in his legs bilaterally.  There is tenderness in his left forearm.  He has no  palpable venous cord.  NEUROLOGIC:  No focal neurological deficits.  IMPRESSION:  The patient is a 77 year old gentleman with stage III colon cancer.  Why he is weak is unclear.  It certainly could be from chemotherapy. He may have superficial thrombophlebitis.  PLAN:  We will go ahead and admit him.  I will do laboratory work on him.  I will give him IV fluids.  I will do a chest x-ray on him. Dictated by:   Rose Phi. Myna Hidalgo, M.D. Attending Physician:  Jim Desanctis DD:  04/09/02 TD:  04/11/02 Job: [id number]

## 2011-02-10 NOTE — Discharge Summary (Signed)
[facility]  Patient:    Damon Snyder, Damon Snyder Visit Number: [id number] MRN: [id number]          Service Type: MED Location: 2S 4782 01 Attending Physician:  Jim Desanctis Dictated by:   Rose Phi. Myna Hidalgo, M.D. Admit Date:  04/09/2002 Discharge Date: 04/12/2002                             Discharge Summary  DISCHARGE DIAGNOSES: 1. Subcutaneous bleeding secondary to over coumadinization. 2. Stage III colon cancer. 3. Non-insulin-dependent diabetes. 4. Hypertension.  CONDITION ON DISCHARGE:  Stable.  ACTIVITY:  As tolerated.  DIET:  No added sugars.  FOLLOWUP:  The patient will come to the office for his regular appointment.  DISCHARGE MEDICATIONS: 1. Coumadin 1 mg p.o. q.d. 2. Amaryl 4 mg q.d. 3. Diovan 160 mg q.d. 4. Compazine 10 mg as needed q.6h. p.r.n. 5. Vicodin (5/500) one q.4h. as needed.  ADMISSION HISTORY AND PHYSICAL:  As stated in admit note.  Please see that for full details.  HOSPITAL COURSE:  Damon Snyder was admitted from the office.  He was very weak. He had extreme pain in his legs and right and left forearm.  We found out that his pharmacy had misfilled his Coumadin and gave him 5 mg tablets instead of 1 mg tablets.  As such, his pro time was greater than 90 seconds.  We gave him 4 units of FFP and 20 mg of vitamin K.  This brought his pro time down to 15 seconds.  His pain began to get better almost immediately.  He was also quite anemic when he came in.  His hemoglobin was 7.3.  He did receive 2 units of packed red blood cells.  He felt a whole lot better after the blood transfusion.  He had elevations of his amylase and lipase.  However, he is asymptomatic with these elevations.  He had no abdominal pain at all.  Chest x-ray done on admission showed no active disease.  He had no diarrhea or constipation.  He felt better beginning on the 17th.  The blood transfusion which was given on the 17th helped out quite a bit  with respect to his overall activity level.  On the 18th he continued to show improvement.  His hemoglobin was up to 10.8 after the blood transfusions.  He did not have any bleeding.  He had decreasing pain in his legs.  He was ambulating.  We went ahead and discontinued his IV fluids.  This allowed him to get about even better.  With discontinuation of IV fluids he was able to ambulate almost back to his baseline.  He was ready to be discharged on the morning of the 19th.  As such, he was discharged to home. Dictated by:   Rose Phi. Myna Hidalgo, M.D. Attending Physician:  Jim Desanctis DD:  04/11/02 TD:  04/17/02 Job: [id number]

## 2011-02-10 NOTE — Op Note (Signed)
[facility]  Patient:    Damon Snyder, Damon Snyder Visit Number: [id number] MRN: [id number]          Service Type: SUR Location: 4W 0470 01 Attending Physician:  Gita Kudo Md Dictated by:   Gita Kudo, M.D. Proc. Date: 02/07/02 Admit Date:  02/07/2002   CC:         Verlin Grills, M.D.  Desma Maxim, M.D.                           Operative Report  PROCEDURE:  Right colon resection.  SURGEON:  Gita Kudo, M.D.  ASSISTANT:  Zigmund Daniel, M.D.  ANESTHESIA:  General endotracheal.  PREOPERATIVE DIAGNOSES:  Right colon lesion probable cancer, possible gallstones.  POSTOPERATIVE DIAGNOSES:  Right colon tumor consistent with cancer, pending pathology. No palpable gallstones.  CLINICAL SUMMARY:  A 77 year old male admitted for colon resection.  Workup for anemia showed a colonoscopy normal to the transverse colon but after that unable to proceed. Barium enema shows an apple core lesion of the right colon. CT scan showed possible gallstones.  OPERATIVE FINDINGS:  The patient had no apparent metastatic disease. The liver felt normal. His preop CEA was 65. I did not feel any abnormal nodes in the retroperitoneum. There were some nodes in the mesentry and I couldnot determine whether they were malignant. There was no acidic fluid. I palpated the gallbladder and did not feel stones.  The right ureter was identified and not injured.  DESCRIPTION OF PROCEDURE:  Undersatisfactory general endotracheal anesthesia, having had a good bowel prep, IV Cefotan, heparin and NG and Foley catheters, the abdomen was prepped and draped in a standard fashion. A long midline incision was made andcarried down through his very deep subcu and into the peritoneum. Bleeders were coagulated. Good exposure was obtained, manual and visual laparotomy performed with the findings mentioned above. We then proceeded with aright colectomy.  The  lateral reflections of the colon were taken down with the cautery and the hepatic flexure divided between clamps and ties of 2-0 silk. The colon was reflected medially and I saw the duodenum and did not injure it. The right ureter was identified and not injured. Thedistal ileum was freed from the pelvis and then divided with the GIA stapler. The mesentery was then scored over to the mid transverse colon. Then the mesentery was divided between clamps and ties of 2-0 silk and the omentum likewise divided. The specimen was then removed. The bowel looked healthy on both sides. The distal resection was at about the mid transverse colon and we identified the middle colic artery and did not injure it.A staple anastomosis was performed in a side to side - end to end fashion. The staple line checked for hemostasis which was good and the stab wounds closed with interrupted 3-0 silk suture. At this point, gloves were changed and the abdomen lavaged with saline. The mesentery was then closedon the lateral aspect with interrupted 2-0 figure-of-eight silk suture. This closed it securely to avoid internal hernia with the small bowel. The abdomen was then copiously lavaged with saline.  Before the resection, we carefully inspected the colon and saw that the left colon was not very redundant. I felt that it would be inappropriate to take his splenic flexure down and that the colonoscopy could probably performed to the level that we resected. Therefore the abdomen was closed with running #1 PDSsuture and  staples for the skin. Sterile absorbent dressings were applied. The patient tolerated the procedure well. Blood loss was about 300 cc. He did not require any transfusion. He had satisfactory urinary output and vital signs. Dictated by:   Gita Kudo, M.D. Attending Physician:  Gita Kudo Md DD:  02/07/02 TD:  02/09/02 Job: [id number]

## 2011-09-29 DIAGNOSIS — H2510 Age-related nuclear cataract, unspecified eye: Secondary | ICD-10-CM | POA: Diagnosis not present

## 2011-09-29 DIAGNOSIS — H40029 Open angle with borderline findings, high risk, unspecified eye: Secondary | ICD-10-CM | POA: Diagnosis not present

## 2012-01-11 DIAGNOSIS — Z0000 Encounter for general adult medical examination without abnormal findings: Secondary | ICD-10-CM | POA: Diagnosis not present

## 2012-01-11 DIAGNOSIS — I10 Essential (primary) hypertension: Secondary | ICD-10-CM | POA: Diagnosis not present

## 2012-01-11 DIAGNOSIS — M109 Gout, unspecified: Secondary | ICD-10-CM | POA: Diagnosis not present

## 2012-01-11 DIAGNOSIS — Z1211 Encounter for screening for malignant neoplasm of colon: Secondary | ICD-10-CM | POA: Diagnosis not present

## 2012-01-11 DIAGNOSIS — Z8546 Personal history of malignant neoplasm of prostate: Secondary | ICD-10-CM | POA: Diagnosis not present

## 2012-01-11 DIAGNOSIS — E782 Mixed hyperlipidemia: Secondary | ICD-10-CM | POA: Diagnosis not present

## 2012-01-11 DIAGNOSIS — E119 Type 2 diabetes mellitus without complications: Secondary | ICD-10-CM | POA: Diagnosis not present

## 2012-03-29 DIAGNOSIS — E119 Type 2 diabetes mellitus without complications: Secondary | ICD-10-CM | POA: Diagnosis not present

## 2012-03-29 DIAGNOSIS — H2510 Age-related nuclear cataract, unspecified eye: Secondary | ICD-10-CM | POA: Diagnosis not present

## 2012-03-29 DIAGNOSIS — H40019 Open angle with borderline findings, low risk, unspecified eye: Secondary | ICD-10-CM | POA: Diagnosis not present

## 2012-04-23 DIAGNOSIS — Z85038 Personal history of other malignant neoplasm of large intestine: Secondary | ICD-10-CM | POA: Diagnosis not present

## 2012-04-23 DIAGNOSIS — Z09 Encounter for follow-up examination after completed treatment for conditions other than malignant neoplasm: Secondary | ICD-10-CM | POA: Diagnosis not present

## 2012-04-23 DIAGNOSIS — D126 Benign neoplasm of colon, unspecified: Secondary | ICD-10-CM | POA: Diagnosis not present

## 2012-05-02 DIAGNOSIS — M109 Gout, unspecified: Secondary | ICD-10-CM | POA: Diagnosis not present

## 2012-05-06 ENCOUNTER — Emergency Department (HOSPITAL_COMMUNITY)
Admission: EM | Admit: 2012-05-06 | Discharge: 2012-05-06 | Disposition: A | Discharge status: Home / Self Care | Payer: Medicare Other | Attending: Emergency Medicine | Admitting: Emergency Medicine

## 2012-05-06 ENCOUNTER — Encounter (HOSPITAL_COMMUNITY): Payer: Self-pay | Admitting: Emergency Medicine

## 2012-05-06 DIAGNOSIS — L03039 Cellulitis of unspecified toe: Secondary | ICD-10-CM | POA: Diagnosis not present

## 2012-05-06 DIAGNOSIS — E119 Type 2 diabetes mellitus without complications: Secondary | ICD-10-CM | POA: Diagnosis not present

## 2012-05-06 DIAGNOSIS — L02619 Cutaneous abscess of unspecified foot: Secondary | ICD-10-CM | POA: Diagnosis not present

## 2012-05-06 DIAGNOSIS — M129 Arthropathy, unspecified: Secondary | ICD-10-CM | POA: Diagnosis not present

## 2012-05-06 DIAGNOSIS — L0390 Cellulitis, unspecified: Secondary | ICD-10-CM

## 2012-05-06 DIAGNOSIS — M109 Gout, unspecified: Secondary | ICD-10-CM | POA: Diagnosis not present

## 2012-05-06 DIAGNOSIS — L03032 Cellulitis of left toe: Secondary | ICD-10-CM

## 2012-05-06 HISTORY — DX: Unspecified osteoarthritis, unspecified site: M19.90

## 2012-05-06 LAB — GLUCOSE, CAPILLARY: Glucose-Capillary: 139 mg/dL — ABNORMAL HIGH (ref 70–99)

## 2012-05-06 MED ORDER — LIDOCAINE HCL (PF) 1 % IJ SOLN
5.0000 mL | Freq: Once | INTRAMUSCULAR | Status: AC
  Administered 2012-05-06: 5 mL
  Filled 2012-05-06: qty 5

## 2012-05-06 MED ORDER — CLINDAMYCIN HCL 150 MG PO CAPS: 150.0000 mg | ORAL_CAPSULE | Freq: Four times a day (QID) | ORAL | Status: AC

## 2012-05-06 NOTE — ED Provider Notes (Signed)
History     CSN: [id number]  Arrival date & time 05/06/12  1409   First MD Initiated Contact with Patient 05/06/12 1625      Chief Complaint  Patient presents with  . Foot Pain    (Consider location/radiation/quality/duration/timing/severity/associated sxs/prior treatment) HPI  Pt presents to the ED with complaints of left Great Toe pain, he has a hx of gout and takes daily medications for it. He thought that his what was causing his toe pain but then his family member noticed puss on his great toe this morning. Therefore, he came to the ER to get it evaluated because his PCP is out of town. He denies having fevers, chills, N/V/D. Pt is a diabetic and states that his sugars have been stable per his baseline. He denies inability to ambulate. VSS WNL.  Past Medical History  Diagnosis Date  . Arthritis   . Diabetes mellitus   . Cancer     Past Surgical History  Procedure Date  . Colon surgery     Family History  Problem Relation Age of Onset  . Hypertension Mother   . Cancer Sister     History  Substance Use Topics  . Smoking status: Never Smoker   . Smokeless tobacco: Not on file  . Alcohol Use: No      Review of Systems   HEENT: denies blurry vision or change in hearing PULMONARY: Denies difficulty breathing and SOB CARDIAC: denies chest pain or heart palpitations MUSCULOSKELETAL:  denies being unable to ambulate ABDOMEN AL: denies abdominal pain GU: denies loss of bowel or urinary control NEURO: denies numbness and tingling in extremities SKIN: rash and abscess to left great toe PSYCH: patient denies anxiety or depression. NECK: Pt denies having neck pain     Allergies  Review of patient's allergies indicates no known allergies.  Home Medications   Current Outpatient Rx  Name Route Sig Dispense Refill  . ALLOPURINOL 100 MG PO TABS Oral Take 100 mg by mouth daily.    Marland Kitchen AMLODIPINE BESYLATE 10 MG PO TABS Oral Take 10 mg by mouth daily.    . ASPIRIN  81 MG PO TABS Oral Take 81 mg by mouth daily.    . ATORVASTATIN CALCIUM 20 MG PO TABS Oral Take 20 mg by mouth daily.    Marland Kitchen BENAZEPRIL HCL 20 MG PO TABS Oral Take 20 mg by mouth daily.    . COLCHICINE 0.6 MG PO TABS Oral Take 0.6 mg by mouth daily.    Marland Kitchen HYDROCODONE-ACETAMINOPHEN 5-325 MG PO TABS Oral Take 1 tablet by mouth every 6 (six) hours as needed. pain    . INSULIN GLARGINE 100 UNIT/ML Adams SOLN Subcutaneous Inject 42 Units into the skin at bedtime.    Marland Kitchen OLMESARTAN MEDOXOMIL 40 MG PO TABS Oral Take 40 mg by mouth daily.    Marland Kitchen CLINDAMYCIN HCL 150 MG PO CAPS Oral Take 1 capsule (150 mg total) by mouth every 6 (six) hours. 28 capsule 0    BP 127/57  Pulse 74  Temp 98 F (36.7 C) (Oral)  Resp 18  SpO2 100%  Physical Exam  Nursing note and vitals reviewed. Constitutional: He appears well-developed and well-nourished. No distress.  HENT:  Head: Normocephalic and atraumatic.  Eyes: Pupils are equal, round, and reactive to light.  Neck: Normal range of motion. Neck supple.  Cardiovascular: Normal rate and regular rhythm.   Pulmonary/Chest: Effort normal.  Abdominal: Soft.  Musculoskeletal:       Left foot: He  exhibits tenderness.       Feet:       Moderate sized paronychia noted to left great toe.  Their is surrounding cellulitis and mild swelling to foot.  Pedal pulse is strong, skin is warm and moist  Neurological: He is alert.  Skin: Skin is warm and dry.    ED Course  Procedures (including critical care time)  Labs Reviewed - No data to display No results found.   1. Paronychia of fifth toe, left   2. Cellulitis       MDM  INCISION AND DRAINAGE Performed by: Dorthula Matas Consent: Verbal consent obtained. Risks and benefits: risks, benefits and alternatives were discussed Type: abscess  Body area: left great toe  Anesthesia: local infiltration  Local anesthetic: lidocaine 2% wo epinephrine  Anesthetic total: 5 ml  Complexity: complex Blunt  dissection to break up loculations  Drainage: purulent  Drainage amount: small  Packing material: None  Patient tolerance: Patient tolerated the procedure well with no immediate complications.  Sugars are 139,. No systemic symptoms Dr. Clarene Duke informed of patient being in ED.  Patient started on Clindamycin with wound recheck ordered for 2-3 days from now or sooner if symptoms are worsening.   Pt has been advised of the symptoms that warrant their return to the ED. Patient has voiced understanding and has agreed to follow-up with the PCP or specialist.          Dorthula Matas, PA 05/06/12 1750

## 2012-05-07 ENCOUNTER — Other Ambulatory Visit: Payer: Self-pay | Admitting: Podiatry

## 2012-05-07 DIAGNOSIS — M869 Osteomyelitis, unspecified: Secondary | ICD-10-CM

## 2012-05-07 DIAGNOSIS — M86679 Other chronic osteomyelitis, unspecified ankle and foot: Secondary | ICD-10-CM | POA: Diagnosis not present

## 2012-05-07 DIAGNOSIS — L03039 Cellulitis of unspecified toe: Secondary | ICD-10-CM | POA: Diagnosis not present

## 2012-05-07 DIAGNOSIS — M25473 Effusion, unspecified ankle: Secondary | ICD-10-CM | POA: Diagnosis not present

## 2012-05-07 DIAGNOSIS — M25476 Effusion, unspecified foot: Secondary | ICD-10-CM | POA: Diagnosis not present

## 2012-05-07 DIAGNOSIS — E1169 Type 2 diabetes mellitus with other specified complication: Secondary | ICD-10-CM | POA: Diagnosis not present

## 2012-05-07 NOTE — ED Provider Notes (Signed)
Medical screening examination/treatment/procedure(s) were performed by non-physician practitioner and as supervising physician I was immediately available for consultation/collaboration.   Isabellamarie Randa M Devanny Palecek, DO 05/07/12 1522 

## 2012-05-08 ENCOUNTER — Ambulatory Visit
Admission: RE | Admit: 2012-05-08 | Discharge: 2012-05-08 | Disposition: A | Discharge status: Home / Self Care | Payer: Medicare Other | Source: Ambulatory Visit | Attending: Podiatry | Admitting: Podiatry

## 2012-05-08 DIAGNOSIS — R609 Edema, unspecified: Secondary | ICD-10-CM | POA: Diagnosis not present

## 2012-05-08 DIAGNOSIS — L089 Local infection of the skin and subcutaneous tissue, unspecified: Secondary | ICD-10-CM | POA: Diagnosis not present

## 2012-05-08 DIAGNOSIS — M869 Osteomyelitis, unspecified: Secondary | ICD-10-CM

## 2012-05-21 DIAGNOSIS — E1069 Type 1 diabetes mellitus with other specified complication: Secondary | ICD-10-CM | POA: Diagnosis not present

## 2012-05-21 DIAGNOSIS — L97509 Non-pressure chronic ulcer of other part of unspecified foot with unspecified severity: Secondary | ICD-10-CM | POA: Diagnosis not present

## 2012-05-29 DIAGNOSIS — E785 Hyperlipidemia, unspecified: Secondary | ICD-10-CM | POA: Diagnosis not present

## 2012-05-29 DIAGNOSIS — I70229 Atherosclerosis of native arteries of extremities with rest pain, unspecified extremity: Secondary | ICD-10-CM | POA: Diagnosis not present

## 2012-05-29 DIAGNOSIS — E119 Type 2 diabetes mellitus without complications: Secondary | ICD-10-CM | POA: Diagnosis not present

## 2012-05-29 DIAGNOSIS — I10 Essential (primary) hypertension: Secondary | ICD-10-CM | POA: Diagnosis not present

## 2012-06-07 DIAGNOSIS — L97509 Non-pressure chronic ulcer of other part of unspecified foot with unspecified severity: Secondary | ICD-10-CM | POA: Diagnosis not present

## 2012-06-07 DIAGNOSIS — E1059 Type 1 diabetes mellitus with other circulatory complications: Secondary | ICD-10-CM | POA: Diagnosis not present

## 2012-06-07 DIAGNOSIS — M79609 Pain in unspecified limb: Secondary | ICD-10-CM | POA: Diagnosis not present

## 2012-06-20 DIAGNOSIS — I739 Peripheral vascular disease, unspecified: Secondary | ICD-10-CM | POA: Diagnosis not present

## 2012-06-20 DIAGNOSIS — I4892 Unspecified atrial flutter: Secondary | ICD-10-CM | POA: Diagnosis not present

## 2012-06-20 DIAGNOSIS — M79609 Pain in unspecified limb: Secondary | ICD-10-CM | POA: Diagnosis not present

## 2012-06-20 DIAGNOSIS — L98499 Non-pressure chronic ulcer of skin of other sites with unspecified severity: Secondary | ICD-10-CM | POA: Diagnosis not present

## 2012-06-24 DIAGNOSIS — L98499 Non-pressure chronic ulcer of skin of other sites with unspecified severity: Secondary | ICD-10-CM | POA: Diagnosis not present

## 2012-06-24 DIAGNOSIS — I739 Peripheral vascular disease, unspecified: Secondary | ICD-10-CM | POA: Diagnosis not present

## 2012-06-24 DIAGNOSIS — I743 Embolism and thrombosis of arteries of the lower extremities: Secondary | ICD-10-CM | POA: Diagnosis not present

## 2012-06-24 DIAGNOSIS — S98119A Complete traumatic amputation of unspecified great toe, initial encounter: Secondary | ICD-10-CM | POA: Diagnosis not present

## 2012-06-24 DIAGNOSIS — L97509 Non-pressure chronic ulcer of other part of unspecified foot with unspecified severity: Secondary | ICD-10-CM | POA: Diagnosis not present

## 2012-07-03 DIAGNOSIS — I70219 Atherosclerosis of native arteries of extremities with intermittent claudication, unspecified extremity: Secondary | ICD-10-CM | POA: Diagnosis not present

## 2012-07-03 DIAGNOSIS — I739 Peripheral vascular disease, unspecified: Secondary | ICD-10-CM | POA: Diagnosis not present

## 2012-07-03 DIAGNOSIS — M79609 Pain in unspecified limb: Secondary | ICD-10-CM | POA: Diagnosis not present

## 2012-07-08 DIAGNOSIS — I10 Essential (primary) hypertension: Secondary | ICD-10-CM | POA: Diagnosis not present

## 2012-07-08 DIAGNOSIS — N186 End stage renal disease: Secondary | ICD-10-CM | POA: Diagnosis not present

## 2012-07-08 DIAGNOSIS — L97509 Non-pressure chronic ulcer of other part of unspecified foot with unspecified severity: Secondary | ICD-10-CM | POA: Diagnosis not present

## 2012-07-08 DIAGNOSIS — L98499 Non-pressure chronic ulcer of skin of other sites with unspecified severity: Secondary | ICD-10-CM | POA: Diagnosis not present

## 2012-07-12 DIAGNOSIS — Z23 Encounter for immunization: Secondary | ICD-10-CM | POA: Diagnosis not present

## 2012-07-12 DIAGNOSIS — E669 Obesity, unspecified: Secondary | ICD-10-CM | POA: Diagnosis not present

## 2012-07-12 DIAGNOSIS — I10 Essential (primary) hypertension: Secondary | ICD-10-CM | POA: Diagnosis not present

## 2012-07-12 DIAGNOSIS — E1165 Type 2 diabetes mellitus with hyperglycemia: Secondary | ICD-10-CM | POA: Diagnosis not present

## 2012-07-12 DIAGNOSIS — M109 Gout, unspecified: Secondary | ICD-10-CM | POA: Diagnosis not present

## 2012-07-12 DIAGNOSIS — E782 Mixed hyperlipidemia: Secondary | ICD-10-CM | POA: Diagnosis not present

## 2012-07-12 DIAGNOSIS — I739 Peripheral vascular disease, unspecified: Secondary | ICD-10-CM | POA: Diagnosis not present

## 2012-07-12 DIAGNOSIS — Z8546 Personal history of malignant neoplasm of prostate: Secondary | ICD-10-CM | POA: Diagnosis not present

## 2012-07-16 DIAGNOSIS — I739 Peripheral vascular disease, unspecified: Secondary | ICD-10-CM | POA: Diagnosis not present

## 2012-07-16 DIAGNOSIS — L98499 Non-pressure chronic ulcer of skin of other sites with unspecified severity: Secondary | ICD-10-CM | POA: Diagnosis not present

## 2012-07-16 DIAGNOSIS — E119 Type 2 diabetes mellitus without complications: Secondary | ICD-10-CM | POA: Diagnosis not present

## 2012-07-16 DIAGNOSIS — S98119A Complete traumatic amputation of unspecified great toe, initial encounter: Secondary | ICD-10-CM | POA: Diagnosis not present

## 2012-07-16 DIAGNOSIS — L97509 Non-pressure chronic ulcer of other part of unspecified foot with unspecified severity: Secondary | ICD-10-CM | POA: Diagnosis not present

## 2012-07-24 DIAGNOSIS — E1059 Type 1 diabetes mellitus with other circulatory complications: Secondary | ICD-10-CM | POA: Diagnosis not present

## 2012-07-24 DIAGNOSIS — L608 Other nail disorders: Secondary | ICD-10-CM | POA: Diagnosis not present

## 2012-07-24 DIAGNOSIS — M79609 Pain in unspecified limb: Secondary | ICD-10-CM | POA: Diagnosis not present

## 2012-08-07 DIAGNOSIS — I743 Embolism and thrombosis of arteries of the lower extremities: Secondary | ICD-10-CM | POA: Diagnosis not present

## 2012-08-07 DIAGNOSIS — E119 Type 2 diabetes mellitus without complications: Secondary | ICD-10-CM | POA: Diagnosis not present

## 2012-08-07 DIAGNOSIS — I10 Essential (primary) hypertension: Secondary | ICD-10-CM | POA: Diagnosis not present

## 2012-08-07 DIAGNOSIS — I70219 Atherosclerosis of native arteries of extremities with intermittent claudication, unspecified extremity: Secondary | ICD-10-CM | POA: Diagnosis not present

## 2012-09-04 DIAGNOSIS — I70219 Atherosclerosis of native arteries of extremities with intermittent claudication, unspecified extremity: Secondary | ICD-10-CM | POA: Diagnosis not present

## 2012-10-01 DIAGNOSIS — M79609 Pain in unspecified limb: Secondary | ICD-10-CM | POA: Diagnosis not present

## 2012-10-01 DIAGNOSIS — E1059 Type 1 diabetes mellitus with other circulatory complications: Secondary | ICD-10-CM | POA: Diagnosis not present

## 2012-10-01 DIAGNOSIS — L608 Other nail disorders: Secondary | ICD-10-CM | POA: Diagnosis not present

## 2012-10-04 DIAGNOSIS — E119 Type 2 diabetes mellitus without complications: Secondary | ICD-10-CM | POA: Diagnosis not present

## 2012-10-04 DIAGNOSIS — H40019 Open angle with borderline findings, low risk, unspecified eye: Secondary | ICD-10-CM | POA: Diagnosis not present

## 2012-10-04 DIAGNOSIS — H25019 Cortical age-related cataract, unspecified eye: Secondary | ICD-10-CM | POA: Diagnosis not present

## 2012-12-20 ENCOUNTER — Encounter: Payer: Self-pay | Admitting: Podiatry

## 2012-12-31 ENCOUNTER — Ambulatory Visit (INDEPENDENT_AMBULATORY_CARE_PROVIDER_SITE_OTHER): Payer: Medicare Other | Admitting: Podiatry

## 2012-12-31 ENCOUNTER — Encounter: Payer: Self-pay | Admitting: Podiatry

## 2012-12-31 VITALS — BP 157/72 | HR 78 | Ht 76.0 in | Wt 248.0 lb

## 2012-12-31 DIAGNOSIS — B351 Tinea unguium: Secondary | ICD-10-CM | POA: Insufficient documentation

## 2012-12-31 DIAGNOSIS — I739 Peripheral vascular disease, unspecified: Secondary | ICD-10-CM

## 2012-12-31 DIAGNOSIS — M25579 Pain in unspecified ankle and joints of unspecified foot: Secondary | ICD-10-CM

## 2012-12-31 NOTE — Patient Instructions (Addendum)
Seen for diabetic foot care.  Circulation on both feet are poor with decreased sensory nerve function. No new skin lesions noted. Today all nails debrided. May benefit from new pair of DM shoes to accommodate contracted toes and elongated great toes. Please take a form and get it certified by Dr. Clelia Croft for diabetic shoes.

## 2012-12-31 NOTE — Progress Notes (Signed)
History of Present Illness:  79 y.o. year old male patient accompanied by his wife, presents for follow up check on left great toe.  Stated that he feels find and the toe is looking better. Stated that his blood sugar is well under control. Last year he had left great toe infection that involved phalangeal bone. The wound was able to heal following vascular surgery at [facility] by Dr. Arlana Pouch.   Patient Summary List & History reviewed for allergies, medications, medical problems and surgical history.  Review of Systems - General ROS: negative for - chills, fatigue, fever, hot flashes, malaise, night sweats, sleep disturbance, weight gain or weight loss.  Objective: Dermatologic: Deformed and irregularly indented left hallucal nail. All other nails are mildly thickened and discolored. No new skin lesions or abnormal findings.  Vascular: Pedal pulses (DP and PT) are not palpable on both feet.  3 months ago they were faintly palpable.  Orthopedic:  Long great toe that frequently jammed and have toe nail problem both feet.  2nd digits are severely contracted with discolored PIPJ bilateral.   Neurologic:  Decreased response to Monofilament testing bilateral. Decreased DTR at ankle bilateral. Vibratory sensations are within normal.  Assessment: Dystrophic mycotic nails x 10. PVD (S/P Vascular surgery lower limb Lt) IDDM under control.    Treatment: All mycotic nails debrided.  May benefit from a new pair of Diabetic shoes.

## 2013-01-30 DIAGNOSIS — E1129 Type 2 diabetes mellitus with other diabetic kidney complication: Secondary | ICD-10-CM | POA: Diagnosis not present

## 2013-01-30 DIAGNOSIS — E1159 Type 2 diabetes mellitus with other circulatory complications: Secondary | ICD-10-CM | POA: Diagnosis not present

## 2013-01-30 DIAGNOSIS — Z8546 Personal history of malignant neoplasm of prostate: Secondary | ICD-10-CM | POA: Diagnosis not present

## 2013-01-30 DIAGNOSIS — I129 Hypertensive chronic kidney disease with stage 1 through stage 4 chronic kidney disease, or unspecified chronic kidney disease: Secondary | ICD-10-CM | POA: Diagnosis not present

## 2013-01-30 DIAGNOSIS — E782 Mixed hyperlipidemia: Secondary | ICD-10-CM | POA: Diagnosis not present

## 2013-01-30 DIAGNOSIS — Z6832 Body mass index (BMI) 32.0-32.9, adult: Secondary | ICD-10-CM | POA: Diagnosis not present

## 2013-01-30 DIAGNOSIS — I739 Peripheral vascular disease, unspecified: Secondary | ICD-10-CM | POA: Diagnosis not present

## 2013-01-30 DIAGNOSIS — Z0000 Encounter for general adult medical examination without abnormal findings: Secondary | ICD-10-CM | POA: Diagnosis not present

## 2013-01-30 DIAGNOSIS — E669 Obesity, unspecified: Secondary | ICD-10-CM | POA: Diagnosis not present

## 2013-03-05 DIAGNOSIS — I70219 Atherosclerosis of native arteries of extremities with intermittent claudication, unspecified extremity: Secondary | ICD-10-CM | POA: Diagnosis not present

## 2013-04-01 ENCOUNTER — Encounter: Payer: Self-pay | Admitting: Podiatry

## 2013-04-01 ENCOUNTER — Ambulatory Visit (INDEPENDENT_AMBULATORY_CARE_PROVIDER_SITE_OTHER): Payer: Medicare Other | Admitting: Podiatry

## 2013-04-01 DIAGNOSIS — M25579 Pain in unspecified ankle and joints of unspecified foot: Secondary | ICD-10-CM | POA: Diagnosis not present

## 2013-04-01 DIAGNOSIS — B351 Tinea unguium: Secondary | ICD-10-CM

## 2013-04-01 DIAGNOSIS — I739 Peripheral vascular disease, unspecified: Secondary | ICD-10-CM

## 2013-04-01 NOTE — Progress Notes (Signed)
Subjective: 79 year old male presents for diabetic foot care for painful toe nails. Stated that his blood sugar is under control. He was checked out recently at the [facility] in [place]. The report indicated that blood flow in lower limbs are patent.   Objective: Thick dystrophic nails both great toes. Discolored 2nd digit right from severe hammer toe and shoe friction. No open skin lesions noted. Pedal pulses are not palpable.  Subjectitve: Onychomycosis bilateral. PVD. Painful feet.  Plan: Debrided all nails. Return in 3 months or as needed.

## 2013-07-02 ENCOUNTER — Encounter: Payer: Self-pay | Admitting: Podiatry

## 2013-07-02 ENCOUNTER — Ambulatory Visit (INDEPENDENT_AMBULATORY_CARE_PROVIDER_SITE_OTHER): Payer: Medicare Other | Admitting: Podiatry

## 2013-07-02 VITALS — BP 128/65 | HR 82 | Ht 76.0 in | Wt 252.0 lb

## 2013-07-02 DIAGNOSIS — B351 Tinea unguium: Secondary | ICD-10-CM | POA: Diagnosis not present

## 2013-07-02 DIAGNOSIS — I739 Peripheral vascular disease, unspecified: Secondary | ICD-10-CM | POA: Diagnosis not present

## 2013-07-02 DIAGNOSIS — M25579 Pain in unspecified ankle and joints of unspecified foot: Secondary | ICD-10-CM

## 2013-07-02 DIAGNOSIS — E1159 Type 2 diabetes mellitus with other circulatory complications: Secondary | ICD-10-CM

## 2013-07-02 NOTE — Progress Notes (Signed)
Subjective:  79 year old male presents for diabetic foot care for painful toe nails. Stated that his blood sugar is under control.   Objective: Thick dystrophic nails both great toes.   No open skin lesions noted.  Pedal pulses are not palpable.   Subjectitve: Onychomycosis bilateral.  PVD.  Painful feet.   Plan: Debrided all nails.  Return in 3 months or as needed.

## 2013-07-02 NOTE — Patient Instructions (Signed)
Seen for toe nails.  No new problems noted. All nails debrided.

## 2013-08-06 DIAGNOSIS — M109 Gout, unspecified: Secondary | ICD-10-CM | POA: Diagnosis not present

## 2013-08-06 DIAGNOSIS — E1129 Type 2 diabetes mellitus with other diabetic kidney complication: Secondary | ICD-10-CM | POA: Diagnosis not present

## 2013-08-06 DIAGNOSIS — I739 Peripheral vascular disease, unspecified: Secondary | ICD-10-CM | POA: Diagnosis not present

## 2013-08-06 DIAGNOSIS — Z8546 Personal history of malignant neoplasm of prostate: Secondary | ICD-10-CM | POA: Diagnosis not present

## 2013-08-06 DIAGNOSIS — Z23 Encounter for immunization: Secondary | ICD-10-CM | POA: Diagnosis not present

## 2013-08-06 DIAGNOSIS — Z6832 Body mass index (BMI) 32.0-32.9, adult: Secondary | ICD-10-CM | POA: Diagnosis not present

## 2013-08-06 DIAGNOSIS — E669 Obesity, unspecified: Secondary | ICD-10-CM | POA: Diagnosis not present

## 2013-08-06 DIAGNOSIS — E1159 Type 2 diabetes mellitus with other circulatory complications: Secondary | ICD-10-CM | POA: Diagnosis not present

## 2013-08-06 DIAGNOSIS — Z85038 Personal history of other malignant neoplasm of large intestine: Secondary | ICD-10-CM | POA: Diagnosis not present

## 2013-09-22 ENCOUNTER — Observation Stay (HOSPITAL_COMMUNITY)
Admission: EM | Admit: 2013-09-22 | Discharge: 2013-09-23 | Disposition: A | Discharge status: Home / Self Care | Payer: Medicare Other | Attending: Internal Medicine | Admitting: Internal Medicine

## 2013-09-22 ENCOUNTER — Observation Stay (HOSPITAL_COMMUNITY): Payer: Medicare Other

## 2013-09-22 ENCOUNTER — Encounter (HOSPITAL_COMMUNITY): Payer: Self-pay | Admitting: Emergency Medicine

## 2013-09-22 ENCOUNTER — Emergency Department (HOSPITAL_COMMUNITY): Payer: Medicare Other

## 2013-09-22 DIAGNOSIS — I651 Occlusion and stenosis of basilar artery: Secondary | ICD-10-CM | POA: Insufficient documentation

## 2013-09-22 DIAGNOSIS — R0602 Shortness of breath: Secondary | ICD-10-CM | POA: Diagnosis not present

## 2013-09-22 DIAGNOSIS — M109 Gout, unspecified: Secondary | ICD-10-CM | POA: Insufficient documentation

## 2013-09-22 DIAGNOSIS — Z794 Long term (current) use of insulin: Secondary | ICD-10-CM | POA: Diagnosis not present

## 2013-09-22 DIAGNOSIS — E119 Type 2 diabetes mellitus without complications: Secondary | ICD-10-CM | POA: Diagnosis present

## 2013-09-22 DIAGNOSIS — E1142 Type 2 diabetes mellitus with diabetic polyneuropathy: Secondary | ICD-10-CM | POA: Diagnosis not present

## 2013-09-22 DIAGNOSIS — I129 Hypertensive chronic kidney disease with stage 1 through stage 4 chronic kidney disease, or unspecified chronic kidney disease: Secondary | ICD-10-CM | POA: Diagnosis not present

## 2013-09-22 DIAGNOSIS — I5032 Chronic diastolic (congestive) heart failure: Secondary | ICD-10-CM | POA: Diagnosis present

## 2013-09-22 DIAGNOSIS — Z7982 Long term (current) use of aspirin: Secondary | ICD-10-CM | POA: Insufficient documentation

## 2013-09-22 DIAGNOSIS — N182 Chronic kidney disease, stage 2 (mild): Secondary | ICD-10-CM | POA: Insufficient documentation

## 2013-09-22 DIAGNOSIS — I739 Peripheral vascular disease, unspecified: Secondary | ICD-10-CM | POA: Diagnosis not present

## 2013-09-22 DIAGNOSIS — Z79899 Other long term (current) drug therapy: Secondary | ICD-10-CM | POA: Diagnosis not present

## 2013-09-22 DIAGNOSIS — R55 Syncope and collapse: Secondary | ICD-10-CM | POA: Diagnosis not present

## 2013-09-22 DIAGNOSIS — I10 Essential (primary) hypertension: Secondary | ICD-10-CM | POA: Diagnosis present

## 2013-09-22 DIAGNOSIS — R0600 Dyspnea, unspecified: Secondary | ICD-10-CM

## 2013-09-22 DIAGNOSIS — E1049 Type 1 diabetes mellitus with other diabetic neurological complication: Secondary | ICD-10-CM | POA: Diagnosis not present

## 2013-09-22 DIAGNOSIS — R29898 Other symptoms and signs involving the musculoskeletal system: Secondary | ICD-10-CM | POA: Diagnosis not present

## 2013-09-22 DIAGNOSIS — I6350 Cerebral infarction due to unspecified occlusion or stenosis of unspecified cerebral artery: Secondary | ICD-10-CM | POA: Diagnosis not present

## 2013-09-22 LAB — URINALYSIS, ROUTINE W REFLEX MICROSCOPIC
Glucose, UA: NEGATIVE mg/dL
Hgb urine dipstick: NEGATIVE
Ketones, ur: NEGATIVE mg/dL
Protein, ur: NEGATIVE mg/dL

## 2013-09-22 LAB — BASIC METABOLIC PANEL
CO2: 23 mEq/L (ref 19–32)
Calcium: 9.7 mg/dL (ref 8.4–10.5)
Creatinine, Ser: 1.77 mg/dL — ABNORMAL HIGH (ref 0.50–1.35)
GFR calc non Af Amer: 34 mL/min — ABNORMAL LOW (ref >90)
Glucose, Bld: 169 mg/dL — ABNORMAL HIGH (ref 70–99)

## 2013-09-22 LAB — CBC WITH DIFFERENTIAL/PLATELET
Basophils Absolute: 0.1 10*3/uL (ref 0.0–0.1)
Basophils Relative: 1 % (ref 0–1)
Hemoglobin: 12.9 g/dL — ABNORMAL LOW (ref 13.0–17.0)
Lymphs Abs: 0.9 10*3/uL (ref 0.7–4.0)
Monocytes Relative: 8 % (ref 3–12)
Neutro Abs: 4.2 10*3/uL (ref 1.7–7.7)
Neutrophils Relative %: 73 % (ref 43–77)
Platelets: 250 10*3/uL (ref 150–400)
RBC: 4.67 MIL/uL (ref 4.22–5.81)

## 2013-09-22 LAB — TROPONIN I: Troponin I: <0.3 ng/mL (ref <0.30)

## 2013-09-22 LAB — HEMOGLOBIN A1C: Mean Plasma Glucose: 140 mg/dL — ABNORMAL HIGH (ref <117)

## 2013-09-22 MED ORDER — ASPIRIN 81 MG PO CHEW
81.0000 mg | CHEWABLE_TABLET | Freq: Every day | ORAL | Status: DC
  Administered 2013-09-23: 09:00:00 81 mg via ORAL
  Filled 2013-09-22: qty 1

## 2013-09-22 MED ORDER — SODIUM CHLORIDE 0.9 % IJ SOLN
3.0000 mL | Freq: Two times a day (BID) | INTRAMUSCULAR | Status: DC
  Administered 2013-09-22 – 2013-09-23 (×2): 3 mL via INTRAVENOUS

## 2013-09-22 MED ORDER — INSULIN ASPART 100 UNIT/ML ~~LOC~~ SOLN: 0.0000 [IU] | Freq: Every day | SUBCUTANEOUS | Status: DC

## 2013-09-22 MED ORDER — ASPIRIN 81 MG PO TABS: 81.0000 mg | ORAL_TABLET | Freq: Every day | ORAL | Status: DC

## 2013-09-22 MED ORDER — INSULIN ASPART 100 UNIT/ML ~~LOC~~ SOLN
0.0000 [IU] | Freq: Three times a day (TID) | SUBCUTANEOUS | Status: DC
  Administered 2013-09-22: 1 [IU] via SUBCUTANEOUS
  Administered 2013-09-23: 3 [IU] via SUBCUTANEOUS

## 2013-09-22 MED ORDER — ENOXAPARIN SODIUM 40 MG/0.4ML ~~LOC~~ SOLN
40.0000 mg | SUBCUTANEOUS | Status: DC
  Administered 2013-09-22: 22:00:00 40 mg via SUBCUTANEOUS
  Filled 2013-09-22 (×2): qty 0.4

## 2013-09-22 MED ORDER — ALLOPURINOL 100 MG PO TABS
200.0000 mg | ORAL_TABLET | Freq: Every day | ORAL | Status: DC
  Administered 2013-09-23: 09:00:00 200 mg via ORAL
  Filled 2013-09-22: qty 2

## 2013-09-22 MED ORDER — INSULIN GLARGINE 100 UNIT/ML ~~LOC~~ SOLN
42.0000 [IU] | Freq: Every day | SUBCUTANEOUS | Status: DC
  Administered 2013-09-22: 22:00:00 42 [IU] via SUBCUTANEOUS
  Filled 2013-09-22 (×2): qty 0.42

## 2013-09-22 MED ORDER — AMLODIPINE BESYLATE 10 MG PO TABS
10.0000 mg | ORAL_TABLET | Freq: Every day | ORAL | Status: DC
  Administered 2013-09-23: 10 mg via ORAL
  Filled 2013-09-22: qty 1

## 2013-09-22 MED ORDER — ATORVASTATIN CALCIUM 20 MG PO TABS
20.0000 mg | ORAL_TABLET | Freq: Every day | ORAL | Status: DC
  Administered 2013-09-23: 09:00:00 20 mg via ORAL
  Filled 2013-09-22: qty 1

## 2013-09-22 NOTE — ED Provider Notes (Signed)
TIME SEEN: 12:42 PM  CHIEF COMPLAINT: Dizziness, shortness of breath, right arm numbness, nodule around his rectum, bilateral hip pain  HPI: Patient is a 79 year old male with a history of hypertension, diabetes, hyperlipidemia, peripheral vascular disease who presents emergency department with multiple complaints. His major concerns are intermittent lightheadedness and feeling like he may pass out and shortness of breath has been present for the past 2-3 months. He states it is progressively worse last night he decided to come to the emergency department today because he could not see his primary care physician, Dr. Cam Hai. Denies that he's had any chest pain or chest discomfort. No nausea, vomiting or diaphoresis.  Patient also reports over the past 3 months he has had numbness in his right arm. He states he has discussed this with his primary care physician who told them that if it got worse to come to the emergency department. He denies any weakness in his arm. He has had some intermittent neck pain but none currently. No history of an injury. He has not on anti-coagulation. He has a headache. No prior history of stroke or TIA.  Patient also complaining of bilateral hip pain and bilateral great toe pain and states this feels like his gout. He has not had a history of injury to these areas. No fevers, redness or warmth.  Patient is also complaining of a small "knot" at the lateral aspect of his rectum for the past week that he thinks is now resolved. No drainage. No rectal pain. Rectal bleeding. No melena. No history of inflammatory bowel disease or HIV.  ROS: See HPI Constitutional: no fever  Eyes: no drainage  ENT: no runny nose   Cardiovascular:  no chest pain  Resp: SOB  GI: no vomiting GU: no dysuria Integumentary: no rash  Allergy: no hives  Musculoskeletal: no leg swelling  Neurological: no slurred speech ROS otherwise negative  PAST MEDICAL HISTORY/PAST SURGICAL HISTORY:   Past Medical History  Diagnosis Date  . Arthritis   . Diabetes mellitus   . Cancer     colon  . Hypertension   . Gout   . Foot ulcer   . Difficulty in walking(719.7)     MEDICATIONS:  Prior to Admission medications   Medication Sig Start Date End Date Taking? Authorizing Provider  allopurinol (ZYLOPRIM) 100 MG tablet Take 200 mg by mouth daily.    Yes Historical Provider, MD  amLODipine (NORVASC) 10 MG tablet Take 10 mg by mouth daily.   Yes Historical Provider, MD  aspirin 81 MG tablet Take 81 mg by mouth daily.   Yes Historical Provider, MD  atorvastatin (LIPITOR) 20 MG tablet Take 20 mg by mouth daily.   Yes Historical Provider, MD  benazepril-hydrochlorthiazide (LOTENSIN HCT) 20-12.5 MG per tablet Take 1 tablet by mouth daily.  06/06/13  Yes Historical Provider, MD  colchicine 0.6 MG tablet Take 0.6 mg by mouth daily.   Yes Historical Provider, MD  diphenhydrAMINE (BENADRYL) 25 mg capsule Take 25 mg by mouth every 6 (six) hours as needed for itching or allergies.   Yes Historical Provider, MD  insulin glargine (LANTUS) 100 UNIT/ML injection Inject 42 Units into the skin at bedtime.   Yes Historical Provider, MD  NOVOFINE 32G X 6 MM MISC  03/11/13  Yes Historical Provider, MD  olmesartan (BENICAR) 40 MG tablet Take 40 mg by mouth daily.   Yes Historical Provider, MD    ALLERGIES:  No Known Allergies  SOCIAL HISTORY:  History  Substance Use Topics  . Smoking status: Never Smoker   . Smokeless tobacco: Never Used  . Alcohol Use: No    FAMILY HISTORY: Family History  Problem Relation Age of Onset  . Hypertension Mother   . Cancer Sister     EXAM: BP 136/60  Pulse 88  Temp(Src) 97.7 F (36.5 C) (Oral)  Resp 16  SpO2 100% CONSTITUTIONAL: Alert and oriented and responds appropriately to questions. Well-appearing; well-nourished, well hydrated and nontoxic HEAD: Normocephalic EYES: Conjunctivae clear, PERRL ENT: normal nose; no rhinorrhea; moist mucous membranes;  pharynx without lesions noted NECK: Supple, no meningismus, no LAD , no posterior neck pain, midline tenderness or step-off or deformity CARD: RRR; S1 and S2 appreciated; no murmurs, no clicks, no rubs, no gallops RESP: Normal chest excursion without splinting or tachypnea; breath sounds clear and equal bilaterally; no wheezes, no rhonchi, no rales,  ABD/GI: Normal bowel sounds; non-distended; soft, non-tender, no rebound, no guarding RECTAL:  Normal rectal tone, no appreciated masses, no fluctuance, no induration, no subcutaneous gas, no erythema or warmth, no drainage, no gross blood or melena PELVIS:  Pelvis is stable and nontender to palpation, full range of motion in bilateral hips with no joint effusion or erythema or warmth BACK:  The back appears normal and is non-tender to palpation, there is no CVA tenderness EXT: No joint effusion noted in the bilateral ankles or toes, no erythema or warmth, no induration, 2+ DP pulses bilaterally, sensation to light touch intact diffusely, Normal ROM in all joints; non-tender to palpation; no edema; normal capillary refill; no cyanosis    SKIN: Normal color for age and race; warm NEURO: Moves all extremities equally, strength 5/5 in all 4 extremity, patient reports normal sensation in bilateral upper and lower extremities with palpation, normal bilateral upper and lower extremity reflexes, normal gait, no pronator drift PSYCH: The patient's mood and manner are appropriate. Grooming and personal hygiene are appropriate.  MEDICAL DECISION MAKING: Patient here with multiple complaints. My primary concern today is his near syncope and shortness of breath. He states that it is not positional. It does seem related to exertion. He has multiple risk factors for ACS. I'm also concerned regarding his right arm numbness. He states it encompasses the entire arm and denies any other radicular symptoms. He has normal reflexes and normal strength and no neck pain  currently. Concern for possible stroke but given patient symptoms are present for 3-4 months, he is outside of a TPA with a meal. Will obtain cardiac labs, head CT, chest x-ray. Patient will need admission for near syncopal workup, shortness of breath.  ED PROGRESS: Patient's labs are unremarkable other than mild elevation of his creatinine at 1.77 which is chronic. Chest x-ray clear. Troponin negative. MRI brain shows no acute ischemic change. Discussed with hospitalist for admission. Will check orthostatic vital signs.     Date: 09/22/2013 11:44 AM  Rate: 86  Rhythm: normal sinus rhythm  QRS Axis: normal  Intervals: normal  ST/T Wave abnormalities: normal  Conduction Disutrbances: none  Narrative Interpretation: unremarkable; left atrial enlargement, T-wave inversions in V1 and V2 which is new, no other new ischemic changes        Layla Maw Aelyn Stanaland, DO 09/22/13 1511

## 2013-09-22 NOTE — Progress Notes (Signed)
Radiologist called this NP with report on MRA showing occlusion of basilar artery. This NP paged neuro on call, Dr. Roseanne Reno, who reviewed the MRA and called NP back. Per Dr. Roseanne Reno, nothing acute at this time and there is blood flow beyond the basilar supplied by other vessels. Dr. Roseanne Reno states this is likely an old issue. No change in treatment plan and no need for formal neuro consult.  Jimmye Norman, NP [facility]

## 2013-09-22 NOTE — H&P (Signed)
[facility] History and Physical  [name] NFA:[id number] DOB: 12-20-1927 DOA: 09/22/2013  Referring physician: EDP PCP: Lupita Raider, MD   Chief Complaint: near syncope today.   HPI: Damon Snyder is a 79 y.o. male with prior h/o hypertension, DM, Gout, diabetic neuropathy, came in for near syncope episode today, . As per t he patient and his wife at bedside he has been having dizziness occasionally, associated with left arm tingling over the last 3 to 4 weeks. He also reports occasional sob over the last few weeks assocaited with dry cough.  He denies chest pain. Denies syncope or orthopnea, pnd, or pedal edema. He denies headaches or any weakness. On arrival to ED, he was evaluated for possible stroke with an MRI brain which was negative for acute stroke. He was referred to medical service for admission for evaluation of near syncope .    Review of Systems:  Constitutional:  No weight loss, night sweats, Fevers, chills, fatigue.  HEENT:  No headaches, Difficulty swallowing,Tooth/dental problems,Sore throat,  No sneezing, itching, ear ache, nasal congestion, post nasal drip,  Cardio-vascular:  No chest pain, Orthopnea, PND, swelling in lower extremities, anasarca, palpitations  GI:  No heartburn, indigestion, abdominal pain, nausea, vomiting, diarrhea, change in bowel habits, loss of appetite  Resp:  No excess mucus, no productive cough, No non-productive cough, No coughing up of blood.No change in color of mucus.No wheezing.No chest wall deformity  Skin:  no rash or lesions.  GU:  no dysuria, change in color of urine, no urgency or frequency. No flank pain.  Musculoskeletal:  No joint pain or swelling. No decreased range of motion. No back pain.  Psych:  No change in mood or affect. No depression or anxiety. No memory loss.   Past Medical History  Diagnosis Date  . Arthritis   . Diabetes mellitus   . Cancer     colon  . Hypertension   . Gout   . Foot ulcer    . Difficulty in walking(719.7)    Past Surgical History  Procedure Laterality Date  . Colon surgery     Social History:  reports that he has never smoked. He has never used smokeless tobacco. He reports that he does not drink alcohol or use illicit drugs.  No Known Allergies  Family History  Problem Relation Age of Onset  . Hypertension Mother   . Cancer Sister      Prior to Admission medications   Medication Sig Start Date End Date Taking? Authorizing Provider  allopurinol (ZYLOPRIM) 100 MG tablet Take 200 mg by mouth daily.    Yes Historical Provider, MD  amLODipine (NORVASC) 10 MG tablet Take 10 mg by mouth daily.   Yes Historical Provider, MD  aspirin 81 MG tablet Take 81 mg by mouth daily.   Yes Historical Provider, MD  atorvastatin (LIPITOR) 20 MG tablet Take 20 mg by mouth daily.   Yes Historical Provider, MD  benazepril-hydrochlorthiazide (LOTENSIN HCT) 20-12.5 MG per tablet Take 1 tablet by mouth daily.  06/06/13  Yes Historical Provider, MD  colchicine 0.6 MG tablet Take 0.6 mg by mouth daily.   Yes Historical Provider, MD  diphenhydrAMINE (BENADRYL) 25 mg capsule Take 25 mg by mouth every 6 (six) hours as needed for itching or allergies.   Yes Historical Provider, MD  insulin glargine (LANTUS) 100 UNIT/ML injection Inject 42 Units into the skin at bedtime.   Yes Historical Provider, MD  NOVOFINE 32G X 6 MM MISC  03/11/13  Yes Historical Provider, MD  olmesartan (BENICAR) 40 MG tablet Take 40 mg by mouth daily.   Yes Historical Provider, MD   Physical Exam: Filed Vitals:   09/22/13 1629  BP: 136/55  Pulse: 71  Temp: 97.4 F (36.3 C)  Resp: 18    BP 136/55  Pulse 71  Temp(Src) 97.4 F (36.3 C) (Oral)  Resp 18  Ht 6\' 4"  (1.93 m)  Wt 108.047 kg (238 lb 3.2 oz)  BMI 29.01 kg/m2  SpO2 100%  General:  Appears calm and comfortable Eyes: PERRL, normal lids, irises & conjunctiva ENT: grossly normal hearing, lips & tongue Neck: no LAD, masses or  thyromegaly Cardiovascular: RRR, no m/r/g. No LE edema. Respiratory: CTA bilaterally, no w/r/r. Normal respiratory effort. Abdomen: soft, ntnd Skin: no rash or induration seen on limited exam Musculoskeletal: grossly normal tone BUE/BLE Psychiatric: grossly normal mood and affect, speech fluent and appropriate Neurologic: grossly non-focal.          Labs on Admission:  Basic Metabolic Panel:  Recent Labs Lab 09/22/13 1240  NA 139  K 4.2  CL 106  CO2 23  GLUCOSE 169*  BUN 37*  CREATININE 1.77*  CALCIUM 9.7   Liver Function Tests: No results found for this basename: AST, ALT, ALKPHOS, BILITOT, PROT, ALBUMIN,  in the last 168 hours No results found for this basename: LIPASE, AMYLASE,  in the last 168 hours No results found for this basename: AMMONIA,  in the last 168 hours CBC:  Recent Labs Lab 09/22/13 1240  WBC 5.8  NEUTROABS 4.2  HGB 12.9*  HCT 40.7  MCV 87.2  PLT 250   Cardiac Enzymes:  Recent Labs Lab 09/22/13 1540  TROPONINI <0.30    BNP (last 3 results)  Recent Labs  09/22/13 1720  PROBNP 16.5   CBG: No results found for this basename: GLUCAP,  in the last 168 hours  Radiological Exams on Admission: Dg Chest 2 View  09/22/2013   CLINICAL DATA:  Shortness of breath and weakness  EXAM: CHEST  2 VIEW  COMPARISON:  Prior CT from 12/26/2005  FINDINGS: The cardiac and mediastinal silhouettes are stable in size and contour, and remain within normal limits. Calcified atheromatous disease noted within the aortic arch.  The lungs are normally inflated. No airspace consolidation, pleural effusion, or pulmonary edema is identified. There is no pneumothorax.  No acute osseous abnormality identified. Multilevel degenerative changes noted within the visualized spine.  IMPRESSION: No active cardiopulmonary disease.   Electronically Signed   By: Rise Mu M.D.   On: 09/22/2013 13:33   Mr Brain Wo Contrast  09/22/2013   CLINICAL DATA:  Right arm  numbness. Sinus pressure with headaches and dizziness.  EXAM: MRI HEAD WITHOUT CONTRAST  TECHNIQUE: Multiplanar, multiecho pulse sequences of the brain and surrounding structures were obtained without intravenous contrast.  COMPARISON:  Report of CT head without contrast 04/12/2003. The images are no longer available.  FINDINGS: No acute or subacute infarct is present. No hemorrhage or mass lesion is present. The ventricles are of normal size. Periventricular and subcortical white matter changes are advanced for age.  Midline structures are within normal limits.  The ventricles are proportionate to the degree of atrophy. No significant extra-axial fluid collection is present. Flow is present in the major intracranial arteries. White matter changes extend into the brainstem.  IMPRESSION: 1. Acute each radial what he but no acute intracranial abnormality to explain the patient's symptoms. 2. No significant sinus disease. 3. Atrophy and  white matter disease likely reflects the sequela of chronic microvascular ischemia.   Electronically Signed   By: Gennette Pac M.D.   On: 09/22/2013 14:49    EKG: PENDING.   Assessment/Plan Active Problems:   Near syncope: - admit to telemetry.  - get 12 Lead EKG. Serial troponins.  - echocardiogram.  - continue with aspirin 81 mg daily.  - get carotid duplex.   Hypertension: Controlled. Resume home medications.   Diabetes Mellitus: - lantus and ssi - hgba1c ordered and pending.   Stage 2 CKD: - At baseline.   DVT Prophylaxis   Code Status: full code Family Communication: discussed with the wife at bedside Disposition Plan: admit to telemetry  Time spent: 75 min  [facility] Pager [phone]

## 2013-09-22 NOTE — ED Notes (Signed)
Per pt, states right shoulder numbness for over a month-saw PCP and said it was nothing and to go to ED if it got worse-states sinus pressure and congestion causing him to have H/A and dizziness

## 2013-09-22 NOTE — Progress Notes (Signed)
Received called from CMT saying that patient had a 2nd degree type I heart block. Non-sustained. Patient now in underlying sinus rhythm with 1st degree heart block. Patient asymptomatic, vitals stable. [facility] ordered, no new orders received.

## 2013-09-23 DIAGNOSIS — I5032 Chronic diastolic (congestive) heart failure: Secondary | ICD-10-CM | POA: Diagnosis not present

## 2013-09-23 DIAGNOSIS — I517 Cardiomegaly: Secondary | ICD-10-CM | POA: Diagnosis not present

## 2013-09-23 DIAGNOSIS — I739 Peripheral vascular disease, unspecified: Secondary | ICD-10-CM

## 2013-09-23 DIAGNOSIS — R55 Syncope and collapse: Secondary | ICD-10-CM

## 2013-09-23 LAB — TROPONIN I: Troponin I: <0.3 ng/mL (ref <0.30)

## 2013-09-23 LAB — GLUCOSE, CAPILLARY
Glucose-Capillary: 88 mg/dL (ref 70–99)
Glucose-Capillary: 205 mg/dL — ABNORMAL HIGH (ref 70–99)
Glucose-Capillary: 146 mg/dL — ABNORMAL HIGH (ref 70–99)

## 2013-09-23 LAB — TSH: TSH: 1.607 u[IU]/mL (ref 0.350–4.500)

## 2013-09-23 MED ORDER — LIVING BETTER WITH HEART FAILURE BOOK
Freq: Once | Status: AC
  Administered 2013-09-23: 1
  Filled 2013-09-23: qty 1

## 2013-09-23 NOTE — Progress Notes (Signed)
Bilateral carotid artery duplex:  1-39% ICA stenosis.  Vertebral artery flow is antegrade.     

## 2013-09-23 NOTE — Progress Notes (Signed)
UR completed 

## 2013-09-23 NOTE — Progress Notes (Signed)
Pt discharged home via family; Pt and family given and explained all discharge instructions, carenotes, and prescriptions; pt and family stated understanding and denied questions/concerns; all f/u appointments in place; IV removed without complicaitons; pt stable at time of discharge   Pt given and spouse given and explained the heart failure pamphlet and both stated understanding; will follow up with PCP regarding new diagnosis and treatment plan

## 2013-09-23 NOTE — Discharge Summary (Signed)
Physician Discharge Summary  Damon Snyder OZH:[id number] DOB: 22-Feb-1928 DOA: 09/22/2013  PCP: Lupita Raider, MD  Admit date: 09/22/2013 Discharge date: 09/23/2013  Time spent: 25 minutes  Recommendations for Outpatient Follow-up:  1. Patient found to be on ARB and separately combination ACE/HCTZ. His, medication is being recommended to be discontinued. 2. Patient will followup with primary care physician, Dr. Clelia Croft in 2-4 weeks 3. Pt has new dx of diastolic heart failure.  Given this issue of dizziness, will defer to PCP if any further eval needs to be done before starting BBlocker  Discharge Diagnoses:  Active Problems:   PVD (peripheral vascular disease)   Near syncope   Essential hypertension, benign   Type II or unspecified type diabetes mellitus without mention of complication, uncontrolled Chronic diastolic Heart failure   Discharge Condition: Improved, being discharged home  Diet recommendation: Low-sodium, heart healthy  Filed Weights   09/22/13 1629  Weight: 108.047 kg (238 lb 3.2 oz)    History of present illness:  On 12/29 evening: 79 y.o. male with prior h/o hypertension, DM, Gout, diabetic neuropathy, came in for near syncope episode, he has been having dizziness occasionally, associated with left arm tingling over the last 3 to 4 weeks. He also reports occasional sob over the last few weeks assocaited with dry cough. He denies chest pain. Denies syncope or orthopnea, pnd, or pedal edema. He denies headaches or any weakness. On arrival to ED, he was evaluated for possible stroke with an MRI brain which was negative for acute stroke. He was referred to medical service for admission for evaluation of near syncope .    Hospital Course:  Active Problems:   PVD (peripheral vascular disease): : Stable medical issue. Patient will continue on aspirin.    Near syncope: Unclear etiology, could be from mild dehydration. Carotid Dopplers negative. All labs including renal  function, electrolytes, white blood cell count, hemoglobin, blood pressure, heart rate and EKG and troponins all negative. No events on telemetry. MRA noted occluded basilar artery, but given no infarct seen on MRI.  Patient already on aspirin. Waiting for echo results. Patient is asymptomatic now.    Essential hypertension, benign: Stable. Patient was found to be on ARB plus ACE combo drug., Drug being recommended to be discontinued.    Type II or unspecified type diabetes mellitus without mention of complication, uncontrolled: Stable. Patient's A1c was only 6.5.  Incidentally found on Echo. BNP normal.  Already on ARB.  Will have pt follow up with his PCP before starting beta blocker given concerns for dizziness  Procedures:  Preliminary carotid Dopplers: No evidence of intracranial stenoses  Echocardiogram: Grade 1 diastolic dysfunction  Consultations:  None  Discharge Exam: Filed Vitals:   09/23/13 1404  BP: 136/49  Pulse:   Temp:   Resp:     General: Alert and oriented x3, no acute distress HEENT: Normocephalic, atraumatic, mucous membranes are moist, cranial nerves II through XII are intact Cardiovascular: Regular rate and rhythm, S1-S2 Respiratory: Clear to auscultation bilaterally Abdomen: Soft, nontender, nondistended, positive bowel sounds Extremities: No clubbing or cyanosis, trace edema Neuro: Nonfocal  Discharge Instructions   Future Appointments Provider Department Dept Phone   10/03/2013 10:00 AM Myeong Eyvonne [facility] [phone]       Medication List    STOP taking these medications       benazepril-hydrochlorthiazide 20-12.5 MG per tablet  Commonly known as:  LOTENSIN HCT      TAKE these medications  allopurinol 100 MG tablet  Commonly known as:  ZYLOPRIM  Take 200 mg by mouth daily.     amLODipine 10 MG tablet  Commonly known as:  NORVASC  Take 10 mg by mouth daily.     aspirin 81 MG tablet  Take 81 mg by mouth  daily.     atorvastatin 20 MG tablet  Commonly known as:  LIPITOR  Take 20 mg by mouth daily.     colchicine 0.6 MG tablet  Take 0.6 mg by mouth daily.     diphenhydrAMINE 25 mg capsule  Commonly known as:  BENADRYL  Take 25 mg by mouth every 6 (six) hours as needed for itching or allergies.     insulin glargine 100 UNIT/ML injection  Commonly known as:  LANTUS  Inject 42 Units into the skin at bedtime.     NOVOFINE 32G X 6 MM Misc  Generic drug:  Insulin Pen Needle     olmesartan 40 MG tablet  Commonly known as:  BENICAR  Take 40 mg by mouth daily.       No Known Allergies     Follow-up Information   Follow up with SHAW,KIMBERLEE, MD In 1 month.   Specialty:  Family Medicine   Contact information:   [address] [place] [address] [phone]        The results of significant diagnostics from this hospitalization (including imaging, microbiology, ancillary and laboratory) are listed below for reference.    Significant Diagnostic Studies: Dg Chest 2 View  09/22/2013     IMPRESSION: No active cardiopulmonary disease.   Electronically Signed   By: Rise Mu M.D.   On: 09/22/2013 13:33   Mr Maxine Glenn Head Wo Contrast  09/22/2013   Mr Angiogram Neck Wo Contrast  09/22/2013     IMPRESSION: 1. No visible flow in the distal V4 segments and the majority of the basilar artery. Given no infarct on preceding brain MRI, there is likely some retrograde flow via large bilateral posterior communicating arteries. 2. Small basilar artery in the setting of fetal type circulation. 3. Multi focal intracranial atherosclerosis, with moderate bilateral M1 stenosis, and advanced/multifocal left P2 stenosis. 4. No evidence of significant stenosis in the neck. No contrast administered in the setting of acute renal failure.   Electronically Signed   By: Tiburcio Pea M.D.   On: 09/22/2013 23:03   Mr Brain Wo Contrast  09/22/2013   IMPRESSION: 1. Acute each  radial what he but no acute intracranial abnormality to explain the patient's symptoms. 2. No significant sinus disease. 3. Atrophy and white matter disease likely reflects the sequela of chronic microvascular ischemia.   Electronically Signed   By: Gennette Pac M.D.   On: 09/22/2013 14:49    Microbiology: No results found for this or any previous visit (from the past 240 hour(s)).   Labs: Basic Metabolic Panel:  Recent Labs Lab 09/22/13 1240  NA 139  K 4.2  CL 106  CO2 23  GLUCOSE 169*  BUN 37*  CREATININE 1.77*  CALCIUM 9.7   Liver Function Tests: No results found for this basename: AST, ALT, ALKPHOS, BILITOT, PROT, ALBUMIN,  in the last 168 hours No results found for this basename: LIPASE, AMYLASE,  in the last 168 hours No results found for this basename: AMMONIA,  in the last 168 hours CBC:  Recent Labs Lab 09/22/13 1240  WBC 5.8  NEUTROABS 4.2  HGB 12.9*  HCT 40.7  MCV 87.2  PLT 250   Cardiac Enzymes:  Recent Labs Lab 09/22/13 1540 09/23/13 0001 09/23/13 0400  TROPONINI <0.30 <0.30 <0.30   BNP: BNP (last 3 results)  Recent Labs  09/22/13 1720  PROBNP 16.5   CBG:  Recent Labs Lab 09/22/13 2055 09/23/13 0727 09/23/13 1146  GLUCAP 190* 88 205*       Signed:  KRISHNAN,SENDIL K  [facility] 09/23/2013, 3:31 PM

## 2013-10-01 DIAGNOSIS — R55 Syncope and collapse: Secondary | ICD-10-CM | POA: Diagnosis not present

## 2013-10-01 DIAGNOSIS — E1129 Type 2 diabetes mellitus with other diabetic kidney complication: Secondary | ICD-10-CM | POA: Diagnosis not present

## 2013-10-01 DIAGNOSIS — N039 Chronic nephritic syndrome with unspecified morphologic changes: Secondary | ICD-10-CM | POA: Diagnosis not present

## 2013-10-01 DIAGNOSIS — I5030 Unspecified diastolic (congestive) heart failure: Secondary | ICD-10-CM | POA: Diagnosis not present

## 2013-10-01 DIAGNOSIS — N183 Chronic kidney disease, stage 3 unspecified: Secondary | ICD-10-CM | POA: Diagnosis not present

## 2013-10-01 DIAGNOSIS — I130 Hypertensive heart and chronic kidney disease with heart failure and stage 1 through stage 4 chronic kidney disease, or unspecified chronic kidney disease: Secondary | ICD-10-CM | POA: Diagnosis not present

## 2013-10-01 DIAGNOSIS — I739 Peripheral vascular disease, unspecified: Secondary | ICD-10-CM | POA: Diagnosis not present

## 2013-10-01 DIAGNOSIS — E1159 Type 2 diabetes mellitus with other circulatory complications: Secondary | ICD-10-CM | POA: Diagnosis not present

## 2013-10-03 ENCOUNTER — Ambulatory Visit (INDEPENDENT_AMBULATORY_CARE_PROVIDER_SITE_OTHER): Payer: Medicare Other | Admitting: Podiatry

## 2013-10-03 ENCOUNTER — Encounter: Payer: Self-pay | Admitting: Podiatry

## 2013-10-03 VITALS — BP 144/68 | HR 79

## 2013-10-03 DIAGNOSIS — B351 Tinea unguium: Secondary | ICD-10-CM | POA: Diagnosis not present

## 2013-10-03 DIAGNOSIS — I739 Peripheral vascular disease, unspecified: Secondary | ICD-10-CM

## 2013-10-03 DIAGNOSIS — M79609 Pain in unspecified limb: Secondary | ICD-10-CM

## 2013-10-03 DIAGNOSIS — M25579 Pain in unspecified ankle and joints of unspecified foot: Secondary | ICD-10-CM

## 2013-10-03 NOTE — Progress Notes (Signed)
  Subjective:  80 year old male presents for diabetic foot care for painful toe nails. Doing well with blood sugar. He was in ER for shortness of breath.  His left leg feels tight since stent was put in to restore circulation.   Objective: Thick dystrophic nails both great toes.  No open skin lesions noted.  Pedal pulses are not palpable.   Subjectitve: Onychomycosis bilateral.  PVD.  Painful feet.   Plan: Debrided all nails.  Return in 3 months or as needed.

## 2013-10-03 NOTE — Patient Instructions (Signed)
Seen for hypertrophic nails. All nails debrided. Return in 3 months or as needed.  

## 2013-10-10 DIAGNOSIS — H40019 Open angle with borderline findings, low risk, unspecified eye: Secondary | ICD-10-CM | POA: Diagnosis not present

## 2013-10-10 DIAGNOSIS — H04129 Dry eye syndrome of unspecified lacrimal gland: Secondary | ICD-10-CM | POA: Diagnosis not present

## 2013-10-10 DIAGNOSIS — H2589 Other age-related cataract: Secondary | ICD-10-CM | POA: Diagnosis not present

## 2013-10-10 DIAGNOSIS — E119 Type 2 diabetes mellitus without complications: Secondary | ICD-10-CM | POA: Diagnosis not present

## 2014-01-02 ENCOUNTER — Ambulatory Visit: Payer: Medicare Other | Admitting: Podiatry

## 2014-01-09 ENCOUNTER — Encounter: Payer: Self-pay | Admitting: Podiatry

## 2014-01-09 ENCOUNTER — Ambulatory Visit (INDEPENDENT_AMBULATORY_CARE_PROVIDER_SITE_OTHER): Payer: Medicare Other | Admitting: Podiatry

## 2014-01-09 VITALS — BP 154/72 | HR 87

## 2014-01-09 DIAGNOSIS — B351 Tinea unguium: Secondary | ICD-10-CM

## 2014-01-09 DIAGNOSIS — I739 Peripheral vascular disease, unspecified: Secondary | ICD-10-CM | POA: Diagnosis not present

## 2014-01-09 DIAGNOSIS — M79609 Pain in unspecified limb: Secondary | ICD-10-CM

## 2014-01-09 DIAGNOSIS — M79606 Pain in leg, unspecified: Secondary | ICD-10-CM | POA: Insufficient documentation

## 2014-01-09 NOTE — Progress Notes (Signed)
Subjective:  80 year old male presents for diabetic foot care for painful toe nails.  Doing well with blood sugar.   Objective: Thick dystrophic nails both great toes.  Plantar calluses under 5th MPJ bilateral. No open skin lesions noted.  Pedal pulses are not palpable.   Subjectitve: Onychomycosis bilateral.  PVD.  Painful feet.   Plan: Debrided all nails.  Return in 3 months or as needed.

## 2014-01-09 NOTE — Patient Instructions (Signed)
Seen for hypertrophic nails. All nails debrided. Return in 3 months or as needed.  

## 2014-02-06 DIAGNOSIS — Z6832 Body mass index (BMI) 32.0-32.9, adult: Secondary | ICD-10-CM | POA: Diagnosis not present

## 2014-02-06 DIAGNOSIS — E669 Obesity, unspecified: Secondary | ICD-10-CM | POA: Diagnosis not present

## 2014-02-06 DIAGNOSIS — I739 Peripheral vascular disease, unspecified: Secondary | ICD-10-CM | POA: Diagnosis not present

## 2014-02-06 DIAGNOSIS — Z0000 Encounter for general adult medical examination without abnormal findings: Secondary | ICD-10-CM | POA: Diagnosis not present

## 2014-02-06 DIAGNOSIS — E1129 Type 2 diabetes mellitus with other diabetic kidney complication: Secondary | ICD-10-CM | POA: Diagnosis not present

## 2014-02-06 DIAGNOSIS — I129 Hypertensive chronic kidney disease with stage 1 through stage 4 chronic kidney disease, or unspecified chronic kidney disease: Secondary | ICD-10-CM | POA: Diagnosis not present

## 2014-02-06 DIAGNOSIS — Z8546 Personal history of malignant neoplasm of prostate: Secondary | ICD-10-CM | POA: Diagnosis not present

## 2014-02-06 DIAGNOSIS — N183 Chronic kidney disease, stage 3 unspecified: Secondary | ICD-10-CM | POA: Diagnosis not present

## 2014-02-06 DIAGNOSIS — E1159 Type 2 diabetes mellitus with other circulatory complications: Secondary | ICD-10-CM | POA: Diagnosis not present

## 2014-04-10 ENCOUNTER — Encounter: Payer: Self-pay | Admitting: Podiatry

## 2014-04-10 ENCOUNTER — Ambulatory Visit (INDEPENDENT_AMBULATORY_CARE_PROVIDER_SITE_OTHER): Payer: Medicare Other | Admitting: Podiatry

## 2014-04-10 VITALS — BP 134/63 | HR 87

## 2014-04-10 DIAGNOSIS — I739 Peripheral vascular disease, unspecified: Secondary | ICD-10-CM | POA: Diagnosis not present

## 2014-04-10 DIAGNOSIS — M79609 Pain in unspecified limb: Secondary | ICD-10-CM | POA: Diagnosis not present

## 2014-04-10 DIAGNOSIS — B351 Tinea unguium: Secondary | ICD-10-CM

## 2014-04-10 DIAGNOSIS — M79606 Pain in leg, unspecified: Secondary | ICD-10-CM

## 2014-04-10 NOTE — Patient Instructions (Signed)
Seen for hypertrophic nails. No new findings.  All nails debrided. Return in 3 months or as needed.

## 2014-04-10 NOTE — Progress Notes (Signed)
Subjective:  80 year old male presents for diabetic foot care for painful toe nails. No new complaints. Doing well with blood sugar.   Objective: Thick dystrophic nails both great toes.  No open skin lesions noted.  Pedal pulses are not palpable.  Severe digital contracture 2nd bilateral. No edema or erythema noted.   Subjectitve: Onychomycosis bilateral.  PVD.  Painful feet.   Plan: Debrided all nails.  Return in 3 months or as needed

## 2014-07-10 ENCOUNTER — Encounter: Payer: Self-pay | Admitting: Podiatry

## 2014-07-10 ENCOUNTER — Ambulatory Visit (INDEPENDENT_AMBULATORY_CARE_PROVIDER_SITE_OTHER): Payer: Medicare Other | Admitting: Podiatry

## 2014-07-10 VITALS — BP 164/73 | HR 83

## 2014-07-10 DIAGNOSIS — M79606 Pain in leg, unspecified: Secondary | ICD-10-CM | POA: Diagnosis not present

## 2014-07-10 DIAGNOSIS — B351 Tinea unguium: Secondary | ICD-10-CM | POA: Diagnosis not present

## 2014-07-10 DIAGNOSIS — I739 Peripheral vascular disease, unspecified: Secondary | ICD-10-CM

## 2014-07-10 NOTE — Patient Instructions (Signed)
Seen for hypertrophic nails. All nails debrided. Return in 3 months or as needed.  

## 2014-07-10 NOTE — Progress Notes (Signed)
Subjective:  80 year old male presents for diabetic foot care for painful toe nails. No new complaints.  Doing well with blood sugar.  He started to exercise in stationary bike and noted of losing belly fat.   Objective: Thick dystrophic nails both great toes.  No open skin lesions noted.  Pedal pulses are not palpable.  Severe digital contracture 2nd bilateral.  No edema or erythema noted.   Subjectitve: Onychomycosis bilateral.  PVD.  Painful feet.   Plan: Debrided all nails.  Return in 3 months or as needed

## 2014-08-19 DIAGNOSIS — Z8546 Personal history of malignant neoplasm of prostate: Secondary | ICD-10-CM | POA: Diagnosis not present

## 2014-08-19 DIAGNOSIS — E782 Mixed hyperlipidemia: Secondary | ICD-10-CM | POA: Diagnosis not present

## 2014-08-19 DIAGNOSIS — E669 Obesity, unspecified: Secondary | ICD-10-CM | POA: Diagnosis not present

## 2014-08-19 DIAGNOSIS — E1122 Type 2 diabetes mellitus with diabetic chronic kidney disease: Secondary | ICD-10-CM | POA: Diagnosis not present

## 2014-08-19 DIAGNOSIS — N183 Chronic kidney disease, stage 3 (moderate): Secondary | ICD-10-CM | POA: Diagnosis not present

## 2014-08-19 DIAGNOSIS — I130 Hypertensive heart and chronic kidney disease with heart failure and stage 1 through stage 4 chronic kidney disease, or unspecified chronic kidney disease: Secondary | ICD-10-CM | POA: Diagnosis not present

## 2014-08-19 DIAGNOSIS — E1151 Type 2 diabetes mellitus with diabetic peripheral angiopathy without gangrene: Secondary | ICD-10-CM | POA: Diagnosis not present

## 2014-08-19 DIAGNOSIS — I739 Peripheral vascular disease, unspecified: Secondary | ICD-10-CM | POA: Diagnosis not present

## 2014-08-19 DIAGNOSIS — Z6832 Body mass index (BMI) 32.0-32.9, adult: Secondary | ICD-10-CM | POA: Diagnosis not present

## 2014-08-19 DIAGNOSIS — H6120 Impacted cerumen, unspecified ear: Secondary | ICD-10-CM | POA: Diagnosis not present

## 2014-09-01 DIAGNOSIS — H6122 Impacted cerumen, left ear: Secondary | ICD-10-CM | POA: Diagnosis not present

## 2014-09-01 DIAGNOSIS — H60502 Unspecified acute noninfective otitis externa, left ear: Secondary | ICD-10-CM | POA: Diagnosis not present

## 2014-09-01 DIAGNOSIS — H9192 Unspecified hearing loss, left ear: Secondary | ICD-10-CM | POA: Diagnosis not present

## 2014-09-08 DIAGNOSIS — H60502 Unspecified acute noninfective otitis externa, left ear: Secondary | ICD-10-CM | POA: Diagnosis not present

## 2014-09-08 DIAGNOSIS — H9192 Unspecified hearing loss, left ear: Secondary | ICD-10-CM | POA: Diagnosis not present

## 2014-09-28 DIAGNOSIS — H608X2 Other otitis externa, left ear: Secondary | ICD-10-CM | POA: Diagnosis not present

## 2014-09-28 DIAGNOSIS — H9192 Unspecified hearing loss, left ear: Secondary | ICD-10-CM | POA: Diagnosis not present

## 2014-10-09 ENCOUNTER — Ambulatory Visit (INDEPENDENT_AMBULATORY_CARE_PROVIDER_SITE_OTHER): Payer: Medicare Other | Admitting: Podiatry

## 2014-10-09 ENCOUNTER — Encounter: Payer: Self-pay | Admitting: Podiatry

## 2014-10-09 VITALS — BP 162/65 | HR 65

## 2014-10-09 DIAGNOSIS — M79606 Pain in leg, unspecified: Secondary | ICD-10-CM | POA: Diagnosis not present

## 2014-10-09 DIAGNOSIS — I739 Peripheral vascular disease, unspecified: Secondary | ICD-10-CM | POA: Diagnosis not present

## 2014-10-09 DIAGNOSIS — B351 Tinea unguium: Secondary | ICD-10-CM

## 2014-10-09 NOTE — Patient Instructions (Signed)
Seen for hypertrophic nails. All nails debrided. Vascular consultation placed. Will contact with appointment. Return in 3 months or as needed.

## 2014-10-09 NOTE — Progress Notes (Signed)
Subjective:  81 year old male presents for diabetic foot care for painful toe nails.  Stated that his legs are hurting more often now after been on feet. He is requesting another vascular doctor. The last vascular doctor in [place] had his office closed. Doing well with blood sugar. Continues to exercise in stationary bike and noted of losing belly fat.   Objective: Thick dystrophic nails both great toes.  No open skin lesions noted.  Pedal pulses are not palpable on both feet..  Severe digital contracture 2nd bilateral.  No edema or erythema noted.   Subjectitve: Onychomycosis bilateral.  PVD.  Claudication. Painful feet.   Plan: Debrided all nails.  May benefit from re evaluation of lower limb circulatory status through vascular consult.  Return in 3 months or as needed

## 2014-10-14 ENCOUNTER — Other Ambulatory Visit: Payer: Self-pay | Admitting: *Deleted

## 2014-10-14 DIAGNOSIS — I70213 Atherosclerosis of native arteries of extremities with intermittent claudication, bilateral legs: Secondary | ICD-10-CM

## 2014-11-06 DIAGNOSIS — R05 Cough: Secondary | ICD-10-CM | POA: Diagnosis not present

## 2014-11-12 ENCOUNTER — Encounter: Payer: Self-pay | Admitting: Vascular Surgery

## 2014-11-13 ENCOUNTER — Encounter: Payer: Self-pay | Admitting: Vascular Surgery

## 2014-11-13 ENCOUNTER — Ambulatory Visit (HOSPITAL_COMMUNITY)
Admission: RE | Admit: 2014-11-13 | Discharge: 2014-11-13 | Disposition: A | Discharge status: Home / Self Care | Payer: Medicare Other | Source: Ambulatory Visit | Attending: Vascular Surgery | Admitting: Vascular Surgery

## 2014-11-13 ENCOUNTER — Ambulatory Visit (INDEPENDENT_AMBULATORY_CARE_PROVIDER_SITE_OTHER): Payer: Medicare Other | Admitting: Vascular Surgery

## 2014-11-13 VITALS — BP 146/59 | HR 54 | Ht 76.0 in | Wt 251.0 lb

## 2014-11-13 DIAGNOSIS — I70219 Atherosclerosis of native arteries of extremities with intermittent claudication, unspecified extremity: Secondary | ICD-10-CM | POA: Insufficient documentation

## 2014-11-13 DIAGNOSIS — I70213 Atherosclerosis of native arteries of extremities with intermittent claudication, bilateral legs: Secondary | ICD-10-CM | POA: Diagnosis not present

## 2014-11-13 DIAGNOSIS — Z48812 Encounter for surgical aftercare following surgery on the circulatory system: Secondary | ICD-10-CM

## 2014-11-13 NOTE — Progress Notes (Signed)
Referred by:  Mayra Neer, MD [address] [place], [place] [address]  Reason for referral: bilateral leg pain  History of Present Illness  Damon Kirker. is a 81 y.o. (1927/12/03) male who presents with chief complaint: bilateral leg pain.  Onset of symptom occurred years ago, without obvious trigger.  Pain is described as cramp in calves with some hip pain now, severity 3-6/10, and associated with ~1 block of walking or 15 minutes on stationery bike.  Patient has attempted to treat this pain with rest.  The patient has a few episodes suggestive of rest pain symptoms but denies any significant frequency of such.  He denies any wounds.  Atherosclerotic risk factors include: IDDM, HTN.  Past Medical History  Diagnosis Date  . Arthritis   . Diabetes mellitus   . Cancer     colon  . Hypertension   . Gout   . Foot ulcer   . Difficulty in walking(719.7)     Past Surgical History  Procedure Laterality Date  . Colon surgery      History   Social History  . Marital Status: Married    Spouse Name: N/A  . Number of Children: N/A  . Years of Education: N/A   Occupational History  . Not on file.   Social History Main Topics  . Smoking status: Never Smoker   . Smokeless tobacco: Never Used  . Alcohol Use: No  . Drug Use: No  . Sexual Activity: Yes   Other Topics Concern  . Not on file   Social History Narrative    Family History  Problem Relation Age of Onset  . Hypertension Mother   . Cancer Sister   . Hypertension Father   . Heart disease Brother     Current Outpatient Prescriptions on File Prior to Visit  Medication Sig Dispense Refill  . allopurinol (ZYLOPRIM) 100 MG tablet Take 200 mg by mouth daily.     Marland Kitchen amLODipine (NORVASC) 10 MG tablet Take 10 mg by mouth daily.    Marland Kitchen aspirin 81 MG tablet Take 81 mg by mouth daily.    Marland Kitchen atorvastatin (LIPITOR) 20 MG tablet Take 20 mg by mouth daily.    . benazepril-hydrochlorthiazide (LOTENSIN HCT)  20-12.5 MG per tablet Take 1 tablet by mouth daily.    . colchicine 0.6 MG tablet Take 0.6 mg by mouth daily.    . diphenhydrAMINE (BENADRYL) 25 mg capsule Take 25 mg by mouth every 6 (six) hours as needed for itching or allergies.    Marland Kitchen insulin glargine (LANTUS) 100 UNIT/ML injection Inject 42 Units into the skin at bedtime.    . Nebivolol HCl (BYSTOLIC) 20 MG TABS Take 20 mg by mouth daily.    Marland Kitchen NOVOFINE 32G X 6 MM MISC     . olmesartan (BENICAR) 40 MG tablet Take 40 mg by mouth daily.     No current facility-administered medications on file prior to visit.    No Known Allergies  REVIEW OF SYSTEMS:  (Positives checked otherwise negative)  CARDIOVASCULAR:  [ ]  chest pain, [ ]  chest pressure, [ ]  palpitations, [ ]  shortness of breath when laying flat, [ ]  shortness of breath with exertion,   [x]  pain in feet when walking, [ ]  pain in feet when laying flat, [ ]  history of blood clot in veins (DVT), [ ]  history of phlebitis, [x]  swelling in legs, [ ]  varicose veins  PULMONARY:  [ ]  productive cough, [ ]   asthma, [ ]  wheezing  NEUROLOGIC:  [x]  weakness in arms or legs, [x]  numbness in arms or legs, [x]  difficulty speaking or slurred speech, [ ]  temporary loss of vision in one eye, [ ]  dizziness  HEMATOLOGIC:  [ ]  bleeding problems, [ ]  problems with blood clotting too easily  MUSCULOSKEL:  [ ]  joint pain, [ ]  joint swelling  GASTROINTEST:  [ ]   Vomiting blood, [ ]   Blood in stool     GENITOURINARY:  [ ]   Burning with urination, [ ]   Blood in urine  PSYCHIATRIC:  [ ]  history of major depression  INTEGUMENTARY:  [ ]  rashes, [ ]  ulcers  CONSTITUTIONAL:  [ ]  fever, [ ]  chills  For VQI Use Only   PRE-ADM LIVING: Home  AMB STATUS: Ambulatory  CAD Sx: None  PRIOR CHF: None  STRESS TEST: [x]  No, [ ]  Normal, [ ]  + ischemia, [ ]  + MI, [ ]  Both   Physical Examination Filed Vitals:   11/13/14 1444  BP: 146/59  Pulse: 54  Height: 6\' 4"  (1.93 m)  Weight: 251 lb (113.853 kg)    SpO2: 100%   Body mass index is 30.57 kg/(m^2).  General: A&O x 3, WD, obese  Head: [place]/AT  Ear/Nose/Throat: Hearing grossly intact, nares w/o erythema or drainage, oropharynx w/o Erythema/Exudate  Eyes: PERRLA, EOMI  Neck: Supple, no nuchal rigidity, no palpable LAD  Pulmonary: Sym exp, good air movt, CTAB, no rales, rhonchi, & wheezing  Cardiac: RRR, Nl S1, S2, no Murmurs, rubs or gallops  Vascular: Vessel Right Left  Radial Palpable Palpable  Brachial  Palpable Palpable  Carotid Palpable, without bruit Palpable, without bruit  Aorta Not palpable N/A  Femoral Not Palpable due to pannus Not Palpable due to pannus  Popliteal Not palpable Not palpable  PT Not Palpable Not Palpable  DP Not Palpable Not Palpable   Gastrointestinal: soft, NTND, -G/R, - HSM, - masses, - CVAT B  Musculoskeletal: M/S 5/5 throughout , Extremities without ischemic changes   Neurologic: CN 2-12 intact , Pain and light touch intact in extremities , Motor exam as listed above  Psychiatric: Judgment intact, Mood & affect appropriatefor pt's clinical situation  Dermatologic: See M/S exam for extremity exam, no rashes otherwise noted  Lymph : No Cervical, Axillary, or Inguinal lymphadenopathy    Non-Invasive Vascular Imaging  ABI (Date: 11/13/2014)  R: 1.27, DP: mono, PT: mono, TBI: 0.37  L: 0.62, DP: mono, PT: mono, TBI: 0.34  Outside Studies/Documentation 2 pages of outside documents were reviewed including: outpatient podiatrist chart.  Medical Decision Making  Damon Snyder. is a 81 y.o. male who presents with: BLE intermittent claudication, likely B SFA occlusion with tibial artery disease, IDDM   I discussed with the patient the natural history of intermittent claudication: 75% of patients have stable or improved symptoms in a year an only 2% require amputation. Eventually 20% may require intervention in a year.  I discussed in depth with the patient the nature of  atherosclerosis, and emphasized the importance of maximal medical management including strict control of blood pressure, blood glucose, and lipid levels, antiplatelet agent, obtaining regular exercise, and cessation of smoking.    The patient is aware that without maximal medical management the underlying atherosclerotic disease process will progress, limiting the benefit of any interventions.  I discussed in depth with the patient a walking plan and how to execute such.  The patient is not interested in starting Pletal. The patient is currently on  a statin: Lipitor. The patient is currently on an anti-platelet: ASA.  The patient will follow up in 3 month weeks with the following studies BLE ABI, BLE arterial duplex.    We discussed what to look for if he develops critical limb ischemia.  He will follow up immediately if he develops such.  Thank you for allowing [place] to participate in this patient's care.  Adele Barthel, MD [facility] Office: [phone] Pager: [phone]  11/13/2014, 3:21 PM

## 2014-11-13 NOTE — Addendum Note (Signed)
Addended by: Mena Goes on: 11/13/2014 04:30 PM   Modules accepted: Orders

## 2015-01-08 ENCOUNTER — Ambulatory Visit (INDEPENDENT_AMBULATORY_CARE_PROVIDER_SITE_OTHER): Payer: Medicare Other | Admitting: Podiatry

## 2015-01-08 ENCOUNTER — Encounter: Payer: Self-pay | Admitting: Podiatry

## 2015-01-08 VITALS — BP 163/63 | HR 58

## 2015-01-08 DIAGNOSIS — B351 Tinea unguium: Secondary | ICD-10-CM | POA: Diagnosis not present

## 2015-01-08 DIAGNOSIS — M79606 Pain in leg, unspecified: Secondary | ICD-10-CM

## 2015-01-08 DIAGNOSIS — I739 Peripheral vascular disease, unspecified: Secondary | ICD-10-CM

## 2015-01-08 NOTE — Patient Instructions (Signed)
Seen for hypertrophic nails. All nails and calluses debrided. Return in 3 months or as needed.  

## 2015-01-08 NOTE — Progress Notes (Signed)
Subjective:  81 year old male presents for diabetic foot care for painful toe nails. No new complaints.  Doing well with blood sugar. No new changes.  Objective: Thick dystrophic nails both great toes.  Thick callus under 5th MPJ R>L.  Pedal pulses are not palpable.  Severe digital contracture 2nd bilateral.  No edema or erythema noted.   Subjectitve: Onychomycosis bilateral.  Keratoma sub 5 bilateral. PVD.  Painful feet.   Plan: Debrided all nails.  Return in 3 months or as needed

## 2015-02-10 DIAGNOSIS — I5030 Unspecified diastolic (congestive) heart failure: Secondary | ICD-10-CM | POA: Diagnosis not present

## 2015-02-10 DIAGNOSIS — E1151 Type 2 diabetes mellitus with diabetic peripheral angiopathy without gangrene: Secondary | ICD-10-CM | POA: Diagnosis not present

## 2015-02-10 DIAGNOSIS — I739 Peripheral vascular disease, unspecified: Secondary | ICD-10-CM | POA: Diagnosis not present

## 2015-02-10 DIAGNOSIS — N183 Chronic kidney disease, stage 3 (moderate): Secondary | ICD-10-CM | POA: Diagnosis not present

## 2015-02-10 DIAGNOSIS — E1122 Type 2 diabetes mellitus with diabetic chronic kidney disease: Secondary | ICD-10-CM | POA: Diagnosis not present

## 2015-02-10 DIAGNOSIS — Z6832 Body mass index (BMI) 32.0-32.9, adult: Secondary | ICD-10-CM | POA: Diagnosis not present

## 2015-02-10 DIAGNOSIS — E6609 Other obesity due to excess calories: Secondary | ICD-10-CM | POA: Diagnosis not present

## 2015-02-10 DIAGNOSIS — I130 Hypertensive heart and chronic kidney disease with heart failure and stage 1 through stage 4 chronic kidney disease, or unspecified chronic kidney disease: Secondary | ICD-10-CM | POA: Diagnosis not present

## 2015-02-10 DIAGNOSIS — C61 Malignant neoplasm of prostate: Secondary | ICD-10-CM | POA: Diagnosis not present

## 2015-02-10 DIAGNOSIS — M545 Low back pain: Secondary | ICD-10-CM | POA: Diagnosis not present

## 2015-02-10 DIAGNOSIS — E782 Mixed hyperlipidemia: Secondary | ICD-10-CM | POA: Diagnosis not present

## 2015-02-10 DIAGNOSIS — Z0000 Encounter for general adult medical examination without abnormal findings: Secondary | ICD-10-CM | POA: Diagnosis not present

## 2015-02-11 ENCOUNTER — Encounter: Payer: Self-pay | Admitting: Vascular Surgery

## 2015-02-11 ENCOUNTER — Other Ambulatory Visit: Payer: Self-pay | Admitting: Vascular Surgery

## 2015-02-11 DIAGNOSIS — I70219 Atherosclerosis of native arteries of extremities with intermittent claudication, unspecified extremity: Secondary | ICD-10-CM

## 2015-02-12 ENCOUNTER — Ambulatory Visit (INDEPENDENT_AMBULATORY_CARE_PROVIDER_SITE_OTHER): Payer: Medicare Other | Admitting: Vascular Surgery

## 2015-02-12 ENCOUNTER — Other Ambulatory Visit: Payer: Self-pay | Admitting: Vascular Surgery

## 2015-02-12 ENCOUNTER — Encounter: Payer: Self-pay | Admitting: Vascular Surgery

## 2015-02-12 ENCOUNTER — Ambulatory Visit (INDEPENDENT_AMBULATORY_CARE_PROVIDER_SITE_OTHER)
Admission: RE | Admit: 2015-02-12 | Discharge: 2015-02-12 | Disposition: A | Discharge status: Home / Self Care | Payer: Medicare Other | Source: Ambulatory Visit | Attending: Vascular Surgery | Admitting: Vascular Surgery

## 2015-02-12 ENCOUNTER — Ambulatory Visit (HOSPITAL_COMMUNITY)
Admission: RE | Admit: 2015-02-12 | Discharge: 2015-02-12 | Disposition: A | Discharge status: Home / Self Care | Payer: Medicare Other | Source: Ambulatory Visit | Attending: Vascular Surgery | Admitting: Vascular Surgery

## 2015-02-12 ENCOUNTER — Ambulatory Visit: Payer: Medicare Other | Admitting: Vascular Surgery

## 2015-02-12 ENCOUNTER — Encounter (HOSPITAL_COMMUNITY): Payer: Medicare Other

## 2015-02-12 VITALS — BP 160/63 | HR 55 | Ht 76.0 in | Wt 244.8 lb

## 2015-02-12 DIAGNOSIS — I70213 Atherosclerosis of native arteries of extremities with intermittent claudication, bilateral legs: Secondary | ICD-10-CM

## 2015-02-12 DIAGNOSIS — I70219 Atherosclerosis of native arteries of extremities with intermittent claudication, unspecified extremity: Secondary | ICD-10-CM | POA: Diagnosis not present

## 2015-02-12 NOTE — Progress Notes (Signed)
    Established Intermittent Claudication  History of Present Illness  Damon Snyder. is a 81 y.o. ([birth year]) male who presents with chief complaint: left > right knee pain.  The patient's symptoms have progressed.  The patient's symptoms are: primarily knee pain currently previously he had intermittent claudication with rare episodes of rest pain.  The patient notes also some left knee instability.  He is trying to avoid TKR if possible.  The patient's treatment regimen currently included: maximal medical management.  He is unable to do the walking plan due to joint pain  The patient's PMH, PSH, SH, FamHx, Med, and Allergies are unchanged from 11/13/14.  On ROS today: +joint pain, no rest pain  Physical Examination  Filed Vitals:   02/12/15 1151  BP: 160/63  Pulse: 55  Height: 6\' 4"  (1.93 m)  Weight: 244 lb 12.8 oz (111.041 kg)  SpO2: 99%   Body mass index is 29.81 kg/(m^2).  General: A&O x 3, WD, WN  Pulmonary: Sym exp, good air movt, CTAB, no rales, rhonchi, & wheezing  Cardiac: RRR, Nl S1, S2, no Murmurs, rubs or gallops  Vascular: Vessel Right Left  Radial Palpable Palpable  Brachial Palpable Palpable  Carotid Palpable, without bruit Palpable, without bruit  Aorta Not palpable N/A  Femoral Palpable Palpable  Popliteal Not palpable Not palpable  PT Not Palpable Not Palpable  DP Not Palpable Not Palpable   Gastrointestinal: soft, NTND, -G/R, - HSM, - masses, - CVAT B  Musculoskeletal: M/S 5/5 throughout , Extremities without ischemic changes   Neurologic: Pain and light touch intact in extremities , Motor exam as listed above  Non-Invasive Vascular Imaging ABI (Date: 02/12/2015)  R: 0.94 (1.27), DP: mono, PT: mono, TBI: 0.45 (0.37)  L: 0.85 (0.62), DP: mono, PT: mono, TBI: 0.32 (0.34)  BLE Arterial Duplex (Date: 02/12/2015)  R iliac: patent down to popliteal artery, disease throughout tibial arteries, with significant stenosis in tibioperoneal trunk   L  iliac: patent down to popliteal artery, disease throughout tibial arteries, with likely peroneal occlusion with reconstitution distally  Medical Decision Making  Damon Mealey. is a 81 y.o. male who presents with:  bilateral leg intermittent claudication without evidence of critical limb ischemia, BLE OA  Based on the patient's vascular studies and examination, I have offered the patient: q3 month ABI.  In this patient, his OA sx prevent him from ambulating enough to get intermittent claudication, so the risk to benefit analysis does not favor any form of intervention.  I discussed in depth with the patient the nature of atherosclerosis, and emphasized the importance of maximal medical management including strict control of blood pressure, blood glucose, and lipid levels, antiplatelet agents, obtaining regular exercise, and cessation of smoking.    The patient is aware that without maximal medical management the underlying atherosclerotic disease process will progress, limiting the benefit of any interventions. The patient is currently on a statin: Lipitor. The patient is currently on an anti-platelet: ASA.  Thank you for allowing [place] to participate in this patient's care.  Adele Barthel, MD [facility] Office: [phone] Pager: [phone]  02/12/2015, 12:39 PM

## 2015-02-15 NOTE — Addendum Note (Signed)
Addended by: Reola Calkins on: 02/15/2015 04:07 PM   Modules accepted: Orders

## 2015-03-22 ENCOUNTER — Other Ambulatory Visit: Payer: Self-pay

## 2015-03-25 DIAGNOSIS — J209 Acute bronchitis, unspecified: Secondary | ICD-10-CM | POA: Diagnosis not present

## 2015-03-25 DIAGNOSIS — J309 Allergic rhinitis, unspecified: Secondary | ICD-10-CM | POA: Diagnosis not present

## 2015-04-09 ENCOUNTER — Ambulatory Visit (INDEPENDENT_AMBULATORY_CARE_PROVIDER_SITE_OTHER): Payer: Medicare Other | Admitting: Podiatry

## 2015-04-09 ENCOUNTER — Encounter: Payer: Self-pay | Admitting: Podiatry

## 2015-04-09 VITALS — BP 150/60 | HR 60

## 2015-04-09 DIAGNOSIS — B351 Tinea unguium: Secondary | ICD-10-CM

## 2015-04-09 DIAGNOSIS — M79606 Pain in leg, unspecified: Secondary | ICD-10-CM

## 2015-04-09 NOTE — Patient Instructions (Signed)
Seen for hypertrophic nails. All nails debrided. Return in 3 months or as needed.  

## 2015-04-09 NOTE — Progress Notes (Signed)
Subjective:  81 year old male presents for diabetic foot care for painful toe nails. No new complaints.  Doing well with blood sugar. No new changes.  Objective: Thick dystrophic nails both great toes.  Thick callus under 5th MPJ R>L.  Pedal pulses are not palpable.  Severe digital contracture 2nd bilateral.  No edema or erythema noted.   Subjectitve: Onychomycosis bilateral.  Keratoma sub 5 bilateral. PVD.  Painful feet.   Plan: Debrided all nails.  Return in 3 months or as needed

## 2015-05-20 ENCOUNTER — Encounter: Payer: Self-pay | Admitting: Family

## 2015-05-21 ENCOUNTER — Ambulatory Visit (INDEPENDENT_AMBULATORY_CARE_PROVIDER_SITE_OTHER): Payer: Medicare Other | Admitting: Family

## 2015-05-21 ENCOUNTER — Ambulatory Visit (HOSPITAL_COMMUNITY)
Admission: RE | Admit: 2015-05-21 | Discharge: 2015-05-21 | Disposition: A | Discharge status: Home / Self Care | Payer: Medicare Other | Source: Ambulatory Visit | Attending: Vascular Surgery | Admitting: Vascular Surgery

## 2015-05-21 ENCOUNTER — Encounter: Payer: Self-pay | Admitting: Family

## 2015-05-21 VITALS — BP 142/65 | HR 59 | Temp 97.1°F | Resp 16 | Ht 76.0 in | Wt 245.0 lb

## 2015-05-21 DIAGNOSIS — E1151 Type 2 diabetes mellitus with diabetic peripheral angiopathy without gangrene: Secondary | ICD-10-CM

## 2015-05-21 DIAGNOSIS — I10 Essential (primary) hypertension: Secondary | ICD-10-CM | POA: Insufficient documentation

## 2015-05-21 DIAGNOSIS — I70219 Atherosclerosis of native arteries of extremities with intermittent claudication, unspecified extremity: Secondary | ICD-10-CM | POA: Insufficient documentation

## 2015-05-21 DIAGNOSIS — I739 Peripheral vascular disease, unspecified: Secondary | ICD-10-CM

## 2015-05-21 DIAGNOSIS — I70213 Atherosclerosis of native arteries of extremities with intermittent claudication, bilateral legs: Secondary | ICD-10-CM | POA: Diagnosis not present

## 2015-05-21 DIAGNOSIS — E119 Type 2 diabetes mellitus without complications: Secondary | ICD-10-CM | POA: Diagnosis not present

## 2015-05-21 DIAGNOSIS — R29898 Other symptoms and signs involving the musculoskeletal system: Secondary | ICD-10-CM | POA: Insufficient documentation

## 2015-05-21 DIAGNOSIS — E1159 Type 2 diabetes mellitus with other circulatory complications: Secondary | ICD-10-CM | POA: Diagnosis not present

## 2015-05-21 DIAGNOSIS — E785 Hyperlipidemia, unspecified: Secondary | ICD-10-CM | POA: Insufficient documentation

## 2015-05-21 NOTE — Patient Instructions (Signed)

## 2015-05-21 NOTE — Progress Notes (Signed)
Filed Vitals:   05/21/15 1105 05/21/15 1112  BP: 148/68 142/65  Pulse: 60 59  Temp: 97.1 F (36.2 C)   TempSrc: Oral   Resp: 16   Height: 6\' 4"  (1.93 m)   Weight: 245 lb (111.131 kg)   SpO2: 99%

## 2015-05-21 NOTE — Progress Notes (Signed)
Established Intermittent Claudication  History of Present Illness  Damon Snyder. is a 81 y.o. ([birth year]) male patient of Dr. Bridgett Larsson who presents with chief complaint: left > right knee pain. The patient's symptoms have progressed. The patient's symptoms are: primarily knee pain currently previously he had intermittent claudication with rare episodes of rest pain. The patient notes also some left knee instability. He is trying to avoid TKR if possible. The patient's treatment regimen currently included: maximal medical management. He is unable to do the walking plan due to joint pain. Pt states he had what sounds like an angioplasty and/or a stent placed in in left leg about 2013 in [place].  Pt states he has a "bad knee" on the left. When he stands he c/o right hip and legs feeling tired after standing a while. 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  Pt denies non healing wounds. He denies rest pain. He uses a stationary bike, 30 minutes daily, 5 days/week.   He never did smoke and is not exposed to second hand smoke.   Pt denies any history of stroke or TIA, denies any history of MI or cardiac procedures.   He has DM, does not know what his A1C's have been, states his FBS is 75-100.   Past Medical History  Diagnosis Date  . Arthritis   . Diabetes mellitus   . Cancer     colon  . Hypertension   . Gout   . Foot ulcer   . Difficulty in walking(719.7)   . Stroke     Social History Social History  Substance Use Topics  . Smoking status: Never Smoker   . Smokeless tobacco: Never Used  . Alcohol Use: No    Family History Family History  Problem Relation Age of Onset  . Hypertension Mother   . Cancer Sister     Ovarian  . Hypertension Father   . Heart disease Brother     After age 38  . Kidney disease Sister     Surgical History Past Surgical History  Procedure Laterality Date  . Colon surgery      No Known  Allergies  Current Outpatient Prescriptions  Medication Sig Dispense Refill  . allopurinol (ZYLOPRIM) 100 MG tablet Take 200 mg by mouth daily.     Marland Kitchen amLODipine (NORVASC) 10 MG tablet Take 10 mg by mouth daily.    Marland Kitchen aspirin 81 MG tablet Take 81 mg by mouth daily.    Marland Kitchen atorvastatin (LIPITOR) 20 MG tablet Take 20 mg by mouth daily.    . benazepril-hydrochlorthiazide (LOTENSIN HCT) 20-12.5 MG per tablet Take 1 tablet by mouth daily.    . colchicine 0.6 MG tablet Take 0.6 mg by mouth daily.    . diphenhydrAMINE (BENADRYL) 25 mg capsule Take 25 mg by mouth every 6 (six) hours as needed for itching or allergies.    Marland Kitchen insulin glargine (LANTUS) 100 UNIT/ML injection Inject 42 Units into the skin at bedtime.    Marland Kitchen LANTUS SOLOSTAR 100 UNIT/ML Solostar Pen     . Nebivolol HCl (BYSTOLIC) 20 MG TABS Take 20 mg by mouth daily.    Marland Kitchen neomycin-polymyxin-hydrocortisone (CORTISPORIN) 3.5-10000-1 otic suspension     . NOVOFINE 32G X 6 MM MISC     . olmesartan (BENICAR) 40 MG tablet Take 40 mg by mouth daily.    Marland Kitchen amoxicillin (AMOXIL) 500 MG capsule      No current facility-administered  medications for this visit.    REVIEW OF SYSTEMS: see HPI for pertinent positives and negatives    Physical Examination  Filed Vitals:   05/21/15 1105 05/21/15 1112  BP: 148/68 142/65  Pulse: 60 59  Temp: 97.1 F (36.2 C)   TempSrc: Oral   Resp: 16   Height: 6\' 4"  (1.93 m)   Weight: 245 lb (111.131 kg)   SpO2: 99%    Body mass index is 29.83 kg/(m^2).  General: A&O x 3, WD, WN  Pulmonary: Sym exp, good air movt, CTAB, no rales, rhonchi, & wheezing  Cardiac: RRR, Nl S1, S2, no Murmurs, rubs or gallops  Vascular: Vessel Right Left  Radial Palpable Palpable  Brachial Palpable Palpable  Carotid Palpable, without bruit Palpable, without bruit  Aorta Not palpable N/A  Femoral Palpable Palpable  Popliteal Not palpable Not palpable  PT Not Palpable Not Palpable  DP Not Palpable Not  Palpable   Gastrointestinal: soft, NTND, -G/R, - HSM, - palpable masses, - CVAT B  Musculoskeletal: M/S 5/5 throughout , Extremities without ischemic changes   Neurologic: Pain and light touch intact in extremities , Motor exam as listed above         Non-Invasive Vascular Imaging ABI (Date: 05/21/2015)  R: 0.83 (0.94, 02/12/15), DP: monphasic, PT: triphasic, TBI: 0.32 (0.45)  L: 0.41 (0.85), DP: monophasic, PT: monophasic, TBI: 0.34 (0.32)    Medical Decision Making  Damon Snyder. is a 81 y.o. male who presents with:  bilateral leg intermittent claudication without evidence of critical limb ischemia, BLE OA. ABI's today indicate worsening arterial occlusive disease in both LE's, significant decrease in the left LE. Pt seems to be exercising his legs enough, but by reports of his sugared drinks intake, his DM is likely worse which is his primary atherosclerotic risk factor. Pt advised to ask his PCP for referral to a DM class. Some basic dietary measures discussed including the Plate Method.  Face to face time with patient was 25 minutes. Over 50% of this time was spent on counseling and coordination of care.   Based on the patient's vascular studies and examination, I have offered the patient: q3 month ABI.  In this patient, his OA sx prevent him from ambulating enough to get intermittent claudication, so the risk to benefit analysis does not favor any form of intervention.  I discussed in depth with the patient the nature of atherosclerosis, and emphasized the importance of maximal medical management including strict control of blood pressure, blood glucose, and lipid levels, antiplatelet agents, obtaining regular exercise, and cessation of smoking.   The patient is aware that without maximal medical management the underlying atherosclerotic disease process will progress, limiting the benefit of any interventions.  The patient is currently on a statin: Lipitor.  The  patient is currently on an anti-platelet: ASA. Thank you for allowing [place] to participate in this patient's care.   NICKEL, Sharmon Leyden, RN, MSN, FNP-C [facility] Office: [phone]  05/21/2015, 11:21 AM  Clinic MD: Bridgett Larsson

## 2015-07-09 ENCOUNTER — Ambulatory Visit: Payer: Medicare Other | Admitting: Podiatry

## 2015-07-09 DIAGNOSIS — H25813 Combined forms of age-related cataract, bilateral: Secondary | ICD-10-CM | POA: Diagnosis not present

## 2015-07-09 DIAGNOSIS — H04123 Dry eye syndrome of bilateral lacrimal glands: Secondary | ICD-10-CM | POA: Diagnosis not present

## 2015-07-09 DIAGNOSIS — E119 Type 2 diabetes mellitus without complications: Secondary | ICD-10-CM | POA: Diagnosis not present

## 2015-07-09 DIAGNOSIS — H40013 Open angle with borderline findings, low risk, bilateral: Secondary | ICD-10-CM | POA: Diagnosis not present

## 2015-07-13 ENCOUNTER — Encounter: Payer: Self-pay | Admitting: Podiatry

## 2015-07-13 ENCOUNTER — Ambulatory Visit (INDEPENDENT_AMBULATORY_CARE_PROVIDER_SITE_OTHER): Payer: Medicare Other | Admitting: Podiatry

## 2015-07-13 VITALS — BP 155/63 | HR 60

## 2015-07-13 DIAGNOSIS — B351 Tinea unguium: Secondary | ICD-10-CM | POA: Diagnosis not present

## 2015-07-13 DIAGNOSIS — M79606 Pain in leg, unspecified: Secondary | ICD-10-CM

## 2015-07-13 NOTE — Patient Instructions (Signed)
Seen for hypertrophic nails. All nails debrided. Return in 3 months or as needed.  

## 2015-07-13 NOTE — Progress Notes (Signed)
Subjective:  81 year old male presents for diabetic foot care for painful toe nails. No new complaints.  Doing well with blood sugar. No new changes.  Objective: Thick dystrophic nails both great toes.  Thick callus under 5th MPJ R>L.  Pedal pulses are not palpable.  Severe digital contracture 2nd bilateral.  No edema or erythema noted.   Subjectitve: Onychomycosis bilateral.  Keratoma sub 5 bilateral. PVD.  Painful feet.   Plan: Debrided all nails.  Return in 3 months or as needed 

## 2015-08-02 DIAGNOSIS — H2511 Age-related nuclear cataract, right eye: Secondary | ICD-10-CM | POA: Diagnosis not present

## 2015-08-02 DIAGNOSIS — H25811 Combined forms of age-related cataract, right eye: Secondary | ICD-10-CM | POA: Diagnosis not present

## 2015-08-10 DIAGNOSIS — H2512 Age-related nuclear cataract, left eye: Secondary | ICD-10-CM | POA: Diagnosis not present

## 2015-08-13 ENCOUNTER — Other Ambulatory Visit: Payer: Self-pay | Admitting: Family Medicine

## 2015-08-13 ENCOUNTER — Ambulatory Visit
Admission: RE | Admit: 2015-08-13 | Discharge: 2015-08-13 | Disposition: A | Discharge status: Home / Self Care | Payer: Medicare Other | Source: Ambulatory Visit | Attending: Family Medicine | Admitting: Family Medicine

## 2015-08-13 DIAGNOSIS — R059 Cough, unspecified: Secondary | ICD-10-CM

## 2015-08-13 DIAGNOSIS — R05 Cough: Secondary | ICD-10-CM

## 2015-08-13 DIAGNOSIS — Z23 Encounter for immunization: Secondary | ICD-10-CM | POA: Diagnosis not present

## 2015-08-13 DIAGNOSIS — E669 Obesity, unspecified: Secondary | ICD-10-CM | POA: Diagnosis not present

## 2015-08-13 DIAGNOSIS — I130 Hypertensive heart and chronic kidney disease with heart failure and stage 1 through stage 4 chronic kidney disease, or unspecified chronic kidney disease: Secondary | ICD-10-CM | POA: Diagnosis not present

## 2015-08-13 DIAGNOSIS — Z794 Long term (current) use of insulin: Secondary | ICD-10-CM | POA: Diagnosis not present

## 2015-08-13 DIAGNOSIS — E1122 Type 2 diabetes mellitus with diabetic chronic kidney disease: Secondary | ICD-10-CM | POA: Diagnosis not present

## 2015-08-13 DIAGNOSIS — Z6832 Body mass index (BMI) 32.0-32.9, adult: Secondary | ICD-10-CM | POA: Diagnosis not present

## 2015-08-13 DIAGNOSIS — E782 Mixed hyperlipidemia: Secondary | ICD-10-CM | POA: Diagnosis not present

## 2015-08-13 DIAGNOSIS — I5030 Unspecified diastolic (congestive) heart failure: Secondary | ICD-10-CM | POA: Diagnosis not present

## 2015-08-13 DIAGNOSIS — N183 Chronic kidney disease, stage 3 (moderate): Secondary | ICD-10-CM | POA: Diagnosis not present

## 2015-08-13 DIAGNOSIS — E1151 Type 2 diabetes mellitus with diabetic peripheral angiopathy without gangrene: Secondary | ICD-10-CM | POA: Diagnosis not present

## 2015-08-13 DIAGNOSIS — I739 Peripheral vascular disease, unspecified: Secondary | ICD-10-CM | POA: Diagnosis not present

## 2015-08-16 DIAGNOSIS — H2512 Age-related nuclear cataract, left eye: Secondary | ICD-10-CM | POA: Diagnosis not present

## 2015-08-16 DIAGNOSIS — H25812 Combined forms of age-related cataract, left eye: Secondary | ICD-10-CM | POA: Diagnosis not present

## 2015-08-24 ENCOUNTER — Encounter: Payer: Self-pay | Admitting: Family

## 2015-08-27 ENCOUNTER — Ambulatory Visit: Payer: Medicare Other | Admitting: Family

## 2015-08-27 ENCOUNTER — Encounter (HOSPITAL_COMMUNITY): Payer: Medicare Other

## 2015-09-09 DIAGNOSIS — H3581 Retinal edema: Secondary | ICD-10-CM | POA: Diagnosis not present

## 2015-09-30 DIAGNOSIS — H3581 Retinal edema: Secondary | ICD-10-CM | POA: Diagnosis not present

## 2015-10-13 ENCOUNTER — Ambulatory Visit: Payer: Medicare Other | Admitting: Podiatry

## 2015-11-16 ENCOUNTER — Encounter: Payer: Self-pay | Admitting: Podiatry

## 2015-11-16 ENCOUNTER — Ambulatory Visit (INDEPENDENT_AMBULATORY_CARE_PROVIDER_SITE_OTHER): Payer: Medicare Other | Admitting: Podiatry

## 2015-11-16 VITALS — BP 161/61 | HR 55

## 2015-11-16 DIAGNOSIS — M79606 Pain in leg, unspecified: Secondary | ICD-10-CM | POA: Diagnosis not present

## 2015-11-16 DIAGNOSIS — B351 Tinea unguium: Secondary | ICD-10-CM

## 2015-11-16 DIAGNOSIS — I739 Peripheral vascular disease, unspecified: Secondary | ICD-10-CM | POA: Diagnosis not present

## 2015-11-16 NOTE — Progress Notes (Signed)
Subjective:  82 year old male presents for diabetic foot care for painful toe nails. Both big toes are very painful. Doing well with blood sugar. No new changes.  Objective: Thick dystrophic nails both great toes.  Thick callus under 5th MPJ R>L.  Pedal pulses are not palpable.  Severe digital contracture 2nd bilateral.  No edema or erythema noted.   Subjectitve: Onychomycosis bilateral.  Keratoma sub 5 bilateral. PVD.  Painful feet.   Plan: Debrided all nails.  Return in 3 months or as needed

## 2015-11-16 NOTE — Patient Instructions (Signed)
Seen for painful hypertrophic nails. All nails debrided and removed ingrown nails from both great toes.. Return in 3 months or as needed.

## 2016-02-15 ENCOUNTER — Ambulatory Visit (INDEPENDENT_AMBULATORY_CARE_PROVIDER_SITE_OTHER): Payer: Medicare Other | Admitting: Podiatry

## 2016-02-15 ENCOUNTER — Encounter: Payer: Self-pay | Admitting: Podiatry

## 2016-02-15 VITALS — BP 158/62 | HR 64

## 2016-02-15 DIAGNOSIS — M79606 Pain in leg, unspecified: Secondary | ICD-10-CM | POA: Diagnosis not present

## 2016-02-15 DIAGNOSIS — B351 Tinea unguium: Secondary | ICD-10-CM

## 2016-02-15 DIAGNOSIS — I739 Peripheral vascular disease, unspecified: Secondary | ICD-10-CM | POA: Diagnosis not present

## 2016-02-15 NOTE — Progress Notes (Signed)
Subjective:  82 year old male presents for diabetic foot care for painful toe nails. Calluses under the ball of feet hurt to walk.  Doing well with blood sugar. No new changes.  Objective: Thick dystrophic nails both great toes.  Thick callus under 5th MPJ R>L.  Pedal pulses are not palpable.  Severe digital contracture 2nd bilateral.  No edema or erythema noted.   Subjectitve: Onychomycosis bilateral.  Keratoma sub 5 bilateral. PVD.  Painful feet.   Plan: Debrided all nails.  Plantar calluses debrided. Return in 3 months or as needed

## 2016-02-15 NOTE — Patient Instructions (Signed)
Seen for hypertrophic nails. All nails debrided. Return in 3 months or as needed.  

## 2016-04-20 DIAGNOSIS — Z0000 Encounter for general adult medical examination without abnormal findings: Secondary | ICD-10-CM | POA: Diagnosis not present

## 2016-04-20 DIAGNOSIS — M179 Osteoarthritis of knee, unspecified: Secondary | ICD-10-CM | POA: Diagnosis not present

## 2016-04-20 DIAGNOSIS — M109 Gout, unspecified: Secondary | ICD-10-CM | POA: Diagnosis not present

## 2016-04-20 DIAGNOSIS — I739 Peripheral vascular disease, unspecified: Secondary | ICD-10-CM | POA: Diagnosis not present

## 2016-04-20 DIAGNOSIS — N183 Chronic kidney disease, stage 3 (moderate): Secondary | ICD-10-CM | POA: Diagnosis not present

## 2016-04-20 DIAGNOSIS — E1151 Type 2 diabetes mellitus with diabetic peripheral angiopathy without gangrene: Secondary | ICD-10-CM | POA: Diagnosis not present

## 2016-04-20 DIAGNOSIS — E782 Mixed hyperlipidemia: Secondary | ICD-10-CM | POA: Diagnosis not present

## 2016-04-20 DIAGNOSIS — C61 Malignant neoplasm of prostate: Secondary | ICD-10-CM | POA: Diagnosis not present

## 2016-04-20 DIAGNOSIS — Z85038 Personal history of other malignant neoplasm of large intestine: Secondary | ICD-10-CM | POA: Diagnosis not present

## 2016-04-20 DIAGNOSIS — I5030 Unspecified diastolic (congestive) heart failure: Secondary | ICD-10-CM | POA: Diagnosis not present

## 2016-04-20 DIAGNOSIS — I130 Hypertensive heart and chronic kidney disease with heart failure and stage 1 through stage 4 chronic kidney disease, or unspecified chronic kidney disease: Secondary | ICD-10-CM | POA: Diagnosis not present

## 2016-04-20 DIAGNOSIS — Z125 Encounter for screening for malignant neoplasm of prostate: Secondary | ICD-10-CM | POA: Diagnosis not present

## 2016-05-01 DIAGNOSIS — E13620 Other specified diabetes mellitus with diabetic dermatitis: Secondary | ICD-10-CM | POA: Diagnosis not present

## 2016-05-01 DIAGNOSIS — M25562 Pain in left knee: Secondary | ICD-10-CM | POA: Diagnosis not present

## 2016-05-17 ENCOUNTER — Ambulatory Visit (INDEPENDENT_AMBULATORY_CARE_PROVIDER_SITE_OTHER): Payer: Medicare Other | Admitting: Podiatry

## 2016-05-17 ENCOUNTER — Encounter: Payer: Self-pay | Admitting: Podiatry

## 2016-05-17 VITALS — BP 165/60 | HR 58

## 2016-05-17 DIAGNOSIS — B351 Tinea unguium: Secondary | ICD-10-CM | POA: Diagnosis not present

## 2016-05-17 DIAGNOSIS — I739 Peripheral vascular disease, unspecified: Secondary | ICD-10-CM | POA: Diagnosis not present

## 2016-05-17 DIAGNOSIS — M79606 Pain in leg, unspecified: Secondary | ICD-10-CM | POA: Diagnosis not present

## 2016-05-17 NOTE — Progress Notes (Signed)
Subjective:  82 year old male presents for diabetic foot care for painful toe nails. Both knees are hurting and calluses under the ball of both feet hurt to walk.  Doing well with blood sugar. No new changes.  Objective: Thick dystrophic nails both great toes.  Thick callus under 5th MPJ R>L.  Pedal pulses are not palpable.  Severe digital contracture 2nd bilateral.  No edema or erythema noted.   Subjectitve: Onychomycosis bilateral.  Keratoma sub 5 bilateral. PVD.  Painful feet.   Plan: Debrided all nails.  Plantar calluses debrided. Return in 3 months or as needed

## 2016-05-17 NOTE — Patient Instructions (Signed)
Seen for hypertrophic nails. All nails debrided. Return in 3 months or as needed.  

## 2016-06-12 DIAGNOSIS — M25562 Pain in left knee: Secondary | ICD-10-CM | POA: Diagnosis not present

## 2016-06-12 DIAGNOSIS — M1712 Unilateral primary osteoarthritis, left knee: Secondary | ICD-10-CM | POA: Diagnosis not present

## 2016-07-06 DIAGNOSIS — H40013 Open angle with borderline findings, low risk, bilateral: Secondary | ICD-10-CM | POA: Diagnosis not present

## 2016-07-06 DIAGNOSIS — E119 Type 2 diabetes mellitus without complications: Secondary | ICD-10-CM | POA: Diagnosis not present

## 2016-07-06 DIAGNOSIS — H35351 Cystoid macular degeneration, right eye: Secondary | ICD-10-CM | POA: Diagnosis not present

## 2016-07-06 DIAGNOSIS — H35371 Puckering of macula, right eye: Secondary | ICD-10-CM | POA: Diagnosis not present

## 2016-07-06 DIAGNOSIS — H10413 Chronic giant papillary conjunctivitis, bilateral: Secondary | ICD-10-CM | POA: Diagnosis not present

## 2016-07-06 DIAGNOSIS — Z961 Presence of intraocular lens: Secondary | ICD-10-CM | POA: Diagnosis not present

## 2016-07-06 DIAGNOSIS — H04123 Dry eye syndrome of bilateral lacrimal glands: Secondary | ICD-10-CM | POA: Diagnosis not present

## 2016-07-18 DIAGNOSIS — E782 Mixed hyperlipidemia: Secondary | ICD-10-CM | POA: Diagnosis not present

## 2016-07-18 DIAGNOSIS — I10 Essential (primary) hypertension: Secondary | ICD-10-CM | POA: Diagnosis not present

## 2016-07-18 DIAGNOSIS — Z01810 Encounter for preprocedural cardiovascular examination: Secondary | ICD-10-CM | POA: Diagnosis not present

## 2016-07-18 DIAGNOSIS — N183 Chronic kidney disease, stage 3 (moderate): Secondary | ICD-10-CM | POA: Diagnosis not present

## 2016-07-18 DIAGNOSIS — I5030 Unspecified diastolic (congestive) heart failure: Secondary | ICD-10-CM | POA: Diagnosis not present

## 2016-07-18 DIAGNOSIS — E1151 Type 2 diabetes mellitus with diabetic peripheral angiopathy without gangrene: Secondary | ICD-10-CM | POA: Diagnosis not present

## 2016-07-18 DIAGNOSIS — I130 Hypertensive heart and chronic kidney disease with heart failure and stage 1 through stage 4 chronic kidney disease, or unspecified chronic kidney disease: Secondary | ICD-10-CM | POA: Diagnosis not present

## 2016-07-18 DIAGNOSIS — I739 Peripheral vascular disease, unspecified: Secondary | ICD-10-CM | POA: Diagnosis not present

## 2016-07-26 ENCOUNTER — Other Ambulatory Visit: Payer: Self-pay

## 2016-07-26 DIAGNOSIS — I739 Peripheral vascular disease, unspecified: Secondary | ICD-10-CM

## 2016-08-16 ENCOUNTER — Encounter: Payer: Self-pay | Admitting: Podiatry

## 2016-08-16 ENCOUNTER — Ambulatory Visit (INDEPENDENT_AMBULATORY_CARE_PROVIDER_SITE_OTHER): Payer: Medicare Other | Admitting: Podiatry

## 2016-08-16 DIAGNOSIS — I739 Peripheral vascular disease, unspecified: Secondary | ICD-10-CM

## 2016-08-16 DIAGNOSIS — M79672 Pain in left foot: Secondary | ICD-10-CM

## 2016-08-16 DIAGNOSIS — B351 Tinea unguium: Secondary | ICD-10-CM

## 2016-08-16 DIAGNOSIS — M79671 Pain in right foot: Secondary | ICD-10-CM | POA: Diagnosis not present

## 2016-08-16 NOTE — Progress Notes (Signed)
Subjective: 82 year old male presents for diabetic foot care for painful toe nails. Both knees are hurting and toes hurt from thick nails. Doing well with blood sugar. No new changes.  Objective: Thick dystrophic nails both great toes.  Pedal pulses are not palpable.  Severe digital contracture 2nd bilateral.  No edema or erythema noted.   Subjectitve: Onychomycosis bilateral.  PVD.  Painful feet.   Plan: Debrided all nails.  Return in 3 months or as needed

## 2016-08-16 NOTE — Patient Instructions (Signed)
Seen for hypertrophic nails. All nails debrided. Return in 3 months or as needed.  

## 2016-08-23 ENCOUNTER — Encounter: Payer: Self-pay | Admitting: Vascular Surgery

## 2016-08-25 ENCOUNTER — Ambulatory Visit (INDEPENDENT_AMBULATORY_CARE_PROVIDER_SITE_OTHER): Payer: Medicare Other | Admitting: Vascular Surgery

## 2016-08-25 ENCOUNTER — Encounter: Payer: Self-pay | Admitting: Vascular Surgery

## 2016-08-25 ENCOUNTER — Ambulatory Visit (HOSPITAL_COMMUNITY)
Admission: RE | Admit: 2016-08-25 | Discharge: 2016-08-25 | Disposition: A | Discharge status: Home / Self Care | Payer: Medicare Other | Source: Ambulatory Visit | Attending: Vascular Surgery | Admitting: Vascular Surgery

## 2016-08-25 VITALS — BP 144/67 | HR 62 | Temp 97.8°F | Resp 18 | Ht 76.0 in | Wt 247.0 lb

## 2016-08-25 DIAGNOSIS — I739 Peripheral vascular disease, unspecified: Secondary | ICD-10-CM

## 2016-08-25 DIAGNOSIS — I70213 Atherosclerosis of native arteries of extremities with intermittent claudication, bilateral legs: Secondary | ICD-10-CM

## 2016-08-25 NOTE — Progress Notes (Signed)
Vitals:   08/25/16 1309  BP: (!) 172/66  Pulse: 62  Resp: 18  Temp: 97.8 F (36.6 C)  Weight: 247 lb (112 kg)  Height: 6\' 4"  (1.93 m)

## 2016-08-25 NOTE — Progress Notes (Signed)
Patient name: Damon Snyder. MRN: [id number] DOB: [birth year] Sex: male  REASON FOR VISIT: pre-op evaluation left knee replacement  HPI: Damon Snyder. is a 82 y.o. male who presents for evaluation prior to left total knee replacement. He has been followed by Dr. Bridgett Larsson for claudication. The patient has suffered from knee pain for years. He is being seen by Dr. Wynonia Lawman for cardiac clearance. Dr. Wynonia Lawman sent him here to see if he is clear from a vascular standpoint prior to pursuing cardiac stress testing.   The patient does not ambulate enough to elicit claudication secondary to knee pain. His left knee is worse than the right. He has been using the stationary bike 30 minutes daily for the past month and a half. He reports some left foot pain at night that may stem from his painful toenails. This does not sound like rest pain. He has been followed by podiatry for this. He denies any nonhealing wounds.   His past medical history includes hypertension, hyperlipidemia, CVA and diabetes. He has never been a smoker. He takes a daily aspirin. He is on a statin for hyperlipidemia. He is on multiple medications for hypertension. He is on insulin for diabetes. He reports that his sugars are between 110 and 190.  Past Medical History:  Diagnosis Date  . Arthritis   . Cancer ([place])    colon  . Diabetes mellitus   . Difficulty in walking(719.7)   . Foot ulcer ([place])   . Gout   . Hypertension   . Stroke [facility])     Family History  Problem Relation Age of Onset  . Hypertension Mother   . Cancer Sister     Ovarian  . Hypertension Father   . Heart disease Brother     After age 93  . Kidney disease Sister     SOCIAL HISTORY: Social History  Substance Use Topics  . Smoking status: Never Smoker  . Smokeless tobacco: Never Used  . Alcohol use No    No Known Allergies  Current Outpatient Prescriptions  Medication Sig Dispense Refill  . allopurinol (ZYLOPRIM) 100 MG tablet Take 200 mg by  mouth daily.     Marland Kitchen amLODipine (NORVASC) 10 MG tablet Take 10 mg by mouth daily.    Marland Kitchen amoxicillin (AMOXIL) 500 MG capsule     . aspirin 81 MG tablet Take 81 mg by mouth daily.    Marland Kitchen atorvastatin (LIPITOR) 20 MG tablet Take 20 mg by mouth daily.    . benazepril-hydrochlorthiazide (LOTENSIN HCT) 20-12.5 MG per tablet Take 1 tablet by mouth daily.    . colchicine 0.6 MG tablet Take 0.6 mg by mouth daily.    . diphenhydrAMINE (BENADRYL) 25 mg capsule Take 25 mg by mouth every 6 (six) hours as needed for itching or allergies.    Marland Kitchen insulin glargine (LANTUS) 100 UNIT/ML injection Inject 42 Units into the skin at bedtime.    Marland Kitchen LANTUS SOLOSTAR 100 UNIT/ML Solostar Pen     . Nebivolol HCl (BYSTOLIC) 20 MG TABS Take 20 mg by mouth daily.    Marland Kitchen neomycin-polymyxin-hydrocortisone (CORTISPORIN) 3.5-10000-1 otic suspension     . NOVOFINE 32G X 6 MM MISC     . olmesartan (BENICAR) 40 MG tablet Take 40 mg by mouth daily.     No current facility-administered medications for this visit.     REVIEW OF SYSTEMS:  [X]  denotes positive finding, [ ]  denotes negative finding Cardiac  Comments:  Chest pain  or chest pressure:    Shortness of breath upon exertion: x   Short of breath when lying flat:    Irregular heart rhythm:        Vascular    Pain in calf, thigh, or hip brought on by ambulation: x   Pain in feet at night that wakes you up from your sleep:  x   Blood clot in your veins:    Leg swelling:  x       Pulmonary    Oxygen at home:    Productive cough:     Wheezing:         Neurologic    Sudden weakness in arms or legs:     Sudden numbness in arms or legs:     Sudden onset of difficulty speaking or slurred speech:    Temporary loss of vision in one eye:     Problems with dizziness:         Gastrointestinal    Blood in stool:     Vomited blood:         Genitourinary    Burning when urinating:     Blood in urine:        Psychiatric    Major depression:         Hematologic    Bleeding  problems:    Problems with blood clotting too easily:        Skin    Rashes or ulcers:        Constitutional    Fever or chills:      PHYSICAL EXAM: Vitals:   08/25/16 1309 08/25/16 1310  BP: (!) 172/66 (!) 144/67  Pulse: 62 62  Resp: 18   Temp: 97.8 F (36.6 C)   Weight: 247 lb (112 kg)   Height: 6\' 4"  (1.93 m)     GENERAL: The patient is a well-nourished male, in no acute distress. The vital signs are documented above. CARDIAC: There is a regular rate and rhythm. No carotid bruits. VASCULAR: 2+ radial pulses, 2+ femoral pulses, nonpalpable popliteal and pedal pulses. Feet are warm and pink bilaterally. No ischemic changes. No wounds. PULMONARY: There is good air exchange bilaterally without wheezing or rales. ABDOMEN: Soft and non-tender with normal pitched bowel sounds.  MUSCULOSKELETAL: There are no major deformities or cyanosis. NEUROLOGIC: No focal weakness or paresthesias are detected. SKIN: Onychomycosis bilateral PSYCHIATRIC: The patient has a normal affect.  DATA:  ABIs 08/25/2016  Right: 0.81 with monophasic waveforms, TBI: 0.55 Left: 0.61 with monophasic waveforms, TBI: 0.27  Previously on 05/21/2015, right: 0.83, left: 0.41   MEDICAL ISSUES: Lateral knee pain, left worse than right B moderate PAD  -The patient's ABIs have improved from his studies a year ago.  -He is safe from a vascular standpoint to undergo left total knee replacement. -Encouraged him to continue to stay active using the stationary bike and to optimize his medical conditions. -Will inform Dr. Wynonia Lawman and Dr. Erlinda Hong. -He will follow-up in 6 months with ABIs.  Virgina Jock, PA-C [facility]   Addendum  I have independently interviewed and examined the patient, and I agree with the physician assistant's findings.  Pt has moderate PAD in L leg with strong monophasic flow in his pedal arteries.  I think it is reasonable to proceed with L TKR from a vascular  viewpoint.  Adele Barthel, MD, FACS [facility] Office: [phone] Pager: [phone]  08/25/2016, 2:39 PM

## 2016-08-29 NOTE — Progress Notes (Signed)
I called patient and scheduled surgery. 

## 2016-08-30 ENCOUNTER — Other Ambulatory Visit (INDEPENDENT_AMBULATORY_CARE_PROVIDER_SITE_OTHER): Payer: Self-pay | Admitting: Orthopaedic Surgery

## 2016-08-30 DIAGNOSIS — M1712 Unilateral primary osteoarthritis, left knee: Secondary | ICD-10-CM

## 2016-08-30 MED ORDER — TRANEXAMIC ACID 1000 MG/10ML IV SOLN: 1000.0000 mg | INTRAVENOUS | Status: AC

## 2016-09-06 ENCOUNTER — Other Ambulatory Visit (HOSPITAL_COMMUNITY): Payer: Medicare Other

## 2016-09-07 ENCOUNTER — Encounter (HOSPITAL_COMMUNITY): Payer: Self-pay

## 2016-09-07 ENCOUNTER — Encounter (HOSPITAL_COMMUNITY)
Admission: RE | Admit: 2016-09-07 | Discharge: 2016-09-07 | Disposition: A | Discharge status: Home / Self Care | Payer: Medicare Other | Source: Ambulatory Visit | Attending: Orthopaedic Surgery | Admitting: Orthopaedic Surgery

## 2016-09-07 DIAGNOSIS — R001 Bradycardia, unspecified: Secondary | ICD-10-CM | POA: Insufficient documentation

## 2016-09-07 DIAGNOSIS — M1712 Unilateral primary osteoarthritis, left knee: Secondary | ICD-10-CM | POA: Insufficient documentation

## 2016-09-07 DIAGNOSIS — Z01810 Encounter for preprocedural cardiovascular examination: Secondary | ICD-10-CM | POA: Insufficient documentation

## 2016-09-07 DIAGNOSIS — Z01812 Encounter for preprocedural laboratory examination: Secondary | ICD-10-CM | POA: Insufficient documentation

## 2016-09-07 HISTORY — DX: Dyspnea, unspecified: R06.00

## 2016-09-07 HISTORY — DX: Personal history of other medical treatment: Z92.89

## 2016-09-07 HISTORY — DX: Anemia, unspecified: D64.9

## 2016-09-07 LAB — CBC WITH DIFFERENTIAL/PLATELET
BASOS PCT: 1 %
Basophils Absolute: 0 10*3/uL (ref 0.0–0.1)
EOS ABS: 0.3 10*3/uL (ref 0.0–0.7)
EOS PCT: 4 %
HCT: 39.2 % (ref 39.0–52.0)
Hemoglobin: 12 g/dL — ABNORMAL LOW (ref 13.0–17.0)
LYMPHS ABS: 1.4 10*3/uL (ref 0.7–4.0)
Lymphocytes Relative: 18 %
MCH: 27.6 pg (ref 26.0–34.0)
MCHC: 30.6 g/dL (ref 30.0–36.0)
MCV: 90.3 fL (ref 78.0–100.0)
MONOS PCT: 5 %
Monocytes Absolute: 0.4 10*3/uL (ref 0.1–1.0)
Neutro Abs: 5.6 10*3/uL (ref 1.7–7.7)
Neutrophils Relative %: 72 %
PLATELETS: 255 10*3/uL (ref 150–400)
RBC: 4.34 MIL/uL (ref 4.22–5.81)
RDW: 13.6 % (ref 11.5–15.5)
WBC: 7.7 10*3/uL (ref 4.0–10.5)

## 2016-09-07 LAB — COMPREHENSIVE METABOLIC PANEL
ALT: 19 U/L (ref 17–63)
ANION GAP: 9 (ref 5–15)
AST: 23 U/L (ref 15–41)
Albumin: 4 g/dL (ref 3.5–5.0)
Alkaline Phosphatase: 95 U/L (ref 38–126)
BUN: 25 mg/dL — ABNORMAL HIGH (ref 6–20)
CALCIUM: 9.6 mg/dL (ref 8.9–10.3)
CHLORIDE: 109 mmol/L (ref 101–111)
CO2: 24 mmol/L (ref 22–32)
Creatinine, Ser: 1.47 mg/dL — ABNORMAL HIGH (ref 0.61–1.24)
GFR calc non Af Amer: 41 mL/min — ABNORMAL LOW (ref >60)
GFR, EST AFRICAN AMERICAN: 48 mL/min — AB (ref >60)
Glucose, Bld: 133 mg/dL — ABNORMAL HIGH (ref 65–99)
POTASSIUM: 4.4 mmol/L (ref 3.5–5.1)
SODIUM: 142 mmol/L (ref 135–145)
Total Bilirubin: 0.5 mg/dL (ref 0.3–1.2)
Total Protein: 7.4 g/dL (ref 6.5–8.1)

## 2016-09-07 LAB — SEDIMENTATION RATE: SED RATE: 21 mm/h — AB (ref 0–16)

## 2016-09-07 LAB — TYPE AND SCREEN
ABO/RH(D): B POS
ANTIBODY SCREEN: NEGATIVE

## 2016-09-07 LAB — URINALYSIS, ROUTINE W REFLEX MICROSCOPIC
Bacteria, UA: NONE SEEN
Bilirubin Urine: NEGATIVE
Glucose, UA: NEGATIVE mg/dL
Hgb urine dipstick: NEGATIVE
KETONES UR: NEGATIVE mg/dL
Leukocytes, UA: NEGATIVE
Nitrite: NEGATIVE
PH: 5 (ref 5.0–8.0)
Protein, ur: 30 mg/dL — AB
RBC / HPF: NONE SEEN RBC/hpf (ref 0–5)
SQUAMOUS EPITHELIAL / LPF: NONE SEEN
Specific Gravity, Urine: 1.015 (ref 1.005–1.030)

## 2016-09-07 LAB — SURGICAL PCR SCREEN
MRSA, PCR: NEGATIVE
STAPHYLOCOCCUS AUREUS: NEGATIVE

## 2016-09-07 LAB — ABO/RH: ABO/RH(D): B POS

## 2016-09-07 LAB — C-REACTIVE PROTEIN

## 2016-09-07 LAB — PROTIME-INR
INR: 1.05
Prothrombin Time: 13.7 seconds (ref 11.4–15.2)

## 2016-09-07 LAB — APTT: aPTT: 30 seconds (ref 24–36)

## 2016-09-07 LAB — GLUCOSE, CAPILLARY: Glucose-Capillary: 131 mg/dL — ABNORMAL HIGH (ref 65–99)

## 2016-09-07 NOTE — Progress Notes (Signed)
Pt. Is followed by PCP-  Dr. Lynnda Child. He was referred to Dr. Wynonia Lawman for cardiac review in anticipation of surgery. Pt. Reports that due to leg pain & previously being seen by [facility], Dr. Wynonia Lawman swiftly sent him there for review & Dr. Wynonia Lawman "didn't do anything", per . Mr. Hepler.  Record requested via fax from Dr. Wynonia Lawman. Epic chart reveals that pt.  had an ECHO.

## 2016-09-07 NOTE — Pre-Procedure Instructions (Addendum)
[name].  09/07/2016      [id number] [address] [place], [place] [id number] [place] [id number] Phone: [phone] Fax: [phone]    Your procedure is scheduled on 09/14/2016  Report to [facility] Admitting at 12:45 P.M.  Call this number if you have problems the morning of surgery:  [phone]   Remember:  Do not eat food or drink liquids after midnight.             Stop ASPIRIN - last dose Friday 12/15   Take these medicines the morning of surgery with A SIP OF WATER : NORVASC, BYSTOLIC   Do not wear jewelry   Do not wear lotions, powders, or perfumes, or deoderant.      Men may shave face and neck.   Do not bring valuables to the hospital.    [facility] is not responsible for any belongings or valuables.  Contacts, dentures or bridgework may not be worn into surgery.  Leave your suitcase in the car.  After surgery it may be brought to your room.  For patients admitted to the hospital, discharge time will be determined by your treatment team.  Patients discharged the day of surgery will not be allowed to drive home.   Name and phone number of your driver:   Family  Special instructions:  Special Instructions: [place] - Preparing for Surgery  Before surgery, you can play an important role.  Because skin is not sterile, your skin needs to be as free of germs as possible.  You can reduce the number of germs on you skin by washing with CHG (chlorahexidine gluconate) soap before surgery.  CHG is an antiseptic cleaner which kills germs and bonds with the skin to continue killing germs even after washing.  Please DO NOT use if you have an allergy to CHG or antibacterial soaps.  If your skin becomes reddened/irritated stop using the CHG and inform your nurse when you arrive at Short Stay.  Do not shave (including legs and underarms) for at least 48 hours prior to the  first CHG shower.  You may shave your face.  Please follow these instructions carefully:   1.  Shower with CHG Soap the night before surgery and the  morning of Surgery.  2.  If you choose to wash your hair, wash your hair first as usual with your  normal shampoo.  3.  After you shampoo, rinse your hair and body thoroughly to remove the  Shampoo.  4.  Use CHG as you would any other liquid soap.  You can apply chg directly to the skin and wash gently with scrungie or a clean washcloth.  5.  Apply the CHG Soap to your body ONLY FROM THE NECK DOWN.    Do not use on open wounds or open sores.  Avoid contact with your eyes, ears, mouth and genitals (private parts).  Wash genitals (private parts)   with your normal soap.  6.  Wash thoroughly, paying special attention to the area where your surgery will be performed.  7.  Thoroughly rinse your body with warm water from the neck down.  8.  DO NOT shower/wash with your normal soap after using and rinsing off   the CHG Soap.  9.  Pat yourself dry with a clean towel.            10.  Wear  clean pajamas.            11.  Place clean sheets on your bed the night of your first shower and do not sleep with pets.    How to Manage Your Diabetes Before and After Surgery  Why is it important to control my blood sugar before and after surgery? . Improving blood sugar levels before and after surgery helps healing and can limit problems. . A way of improving blood sugar control is eating a healthy diet by: o  Eating less sugar and carbohydrates o  Increasing activity/exercise o  Talking with your doctor about reaching your blood sugar goals . High blood sugars (greater than 180 mg/dL) can raise your risk of infections and slow your recovery, so you will need to focus on controlling your diabetes during the weeks before surgery. . Make sure that the doctor who takes care of your diabetes knows about your planned surgery including the date and location.  How do  I manage my blood sugar before surgery? . Check your blood sugar at least 4 times a day, starting 2 days before surgery, to make sure that the level is not too high or low. o Check your blood sugar the morning of your surgery when you wake up and every 2 hours until you get to the Short Stay unit. . If your blood sugar is less than 70 mg/dL, you will need to treat for low blood sugar: o Do not take insulin. o Treat a low blood sugar (less than 70 mg/dL) with  cup of clear juice (cranberry or apple), 4 glucose tablets, OR glucose gel. o Recheck blood sugar in 15 minutes after treatment (to make sure it is greater than 70 mg/dL). If your blood sugar is not greater than 70 mg/dL on recheck, call [phone] for further instructions. . Report your blood sugar to the short stay nurse when you get to Short Stay.  . If you are admitted to the hospital after surgery: o Your blood sugar will be checked by the staff and you will probably be given insulin after surgery (instead of oral diabetes medicines) to make sure you have good blood sugar levels. o The goal for blood sugar control after surgery is 80-180 mg/dL.              WHAT DO I DO ABOUT MY DIABETES MEDICATION?   Marland Kitchen   . THE NIGHT BEFORE SURGERY, take 30___________ units of _LANTUS_________insulin.        . The day of surgery, do not take other diabetes injectables, including Byetta (exenatide), Bydureon (exenatide ER), Victoza (liraglutide), or Trulicity (dulaglutide).   Other Instructions:          Patient Signature:  Date:   Nurse Signature:  Date:   Reviewed and Endorsed by [facility] Patient Education Committee, August 2015 Day of Surgery  Do not apply any lotions/deodorants the morning of surgery.  Please wear clean clothes to the hospital/surgery center.  Please read over the following fact sheets that you were given. Pain Booklet, Coughing and Deep Breathing, Total Joint Packet, MRSA Information and  Surgical Site Infection Prevention

## 2016-09-08 LAB — HEMOGLOBIN A1C
HEMOGLOBIN A1C: 5.8 % — AB (ref 4.8–5.6)
Mean Plasma Glucose: 120 mg/dL

## 2016-09-13 MED ORDER — BUPIVACAINE LIPOSOME 1.3 % IJ SUSP
20.0000 mL | Freq: Once | INTRAMUSCULAR | Status: DC
  Filled 2016-09-13: qty 20

## 2016-09-14 ENCOUNTER — Inpatient Hospital Stay (HOSPITAL_COMMUNITY)
Admission: RE | Admit: 2016-09-14 | Discharge: 2016-09-16 | DRG: 470 | Disposition: A | Discharge status: Home Health | Payer: Medicare Other | Source: Ambulatory Visit | Attending: Orthopaedic Surgery | Admitting: Orthopaedic Surgery

## 2016-09-14 ENCOUNTER — Inpatient Hospital Stay (HOSPITAL_COMMUNITY): Payer: Medicare Other | Admitting: Emergency Medicine

## 2016-09-14 ENCOUNTER — Inpatient Hospital Stay (HOSPITAL_COMMUNITY): Payer: Medicare Other

## 2016-09-14 ENCOUNTER — Encounter (HOSPITAL_COMMUNITY)
Admission: RE | Disposition: A | Discharge status: Home Health | Payer: Self-pay | Source: Ambulatory Visit | Attending: Orthopaedic Surgery

## 2016-09-14 ENCOUNTER — Inpatient Hospital Stay (HOSPITAL_COMMUNITY): Payer: Medicare Other | Admitting: Anesthesiology

## 2016-09-14 ENCOUNTER — Encounter (HOSPITAL_COMMUNITY): Payer: Self-pay | Admitting: *Deleted

## 2016-09-14 DIAGNOSIS — I10 Essential (primary) hypertension: Secondary | ICD-10-CM | POA: Diagnosis present

## 2016-09-14 DIAGNOSIS — Z85038 Personal history of other malignant neoplasm of large intestine: Secondary | ICD-10-CM

## 2016-09-14 DIAGNOSIS — I5032 Chronic diastolic (congestive) heart failure: Secondary | ICD-10-CM | POA: Diagnosis not present

## 2016-09-14 DIAGNOSIS — Z8546 Personal history of malignant neoplasm of prostate: Secondary | ICD-10-CM | POA: Diagnosis not present

## 2016-09-14 DIAGNOSIS — Z794 Long term (current) use of insulin: Secondary | ICD-10-CM

## 2016-09-14 DIAGNOSIS — Z96659 Presence of unspecified artificial knee joint: Secondary | ICD-10-CM

## 2016-09-14 DIAGNOSIS — M19012 Primary osteoarthritis, left shoulder: Secondary | ICD-10-CM | POA: Diagnosis present

## 2016-09-14 DIAGNOSIS — M19011 Primary osteoarthritis, right shoulder: Secondary | ICD-10-CM | POA: Diagnosis present

## 2016-09-14 DIAGNOSIS — E7800 Pure hypercholesterolemia, unspecified: Secondary | ICD-10-CM | POA: Diagnosis present

## 2016-09-14 DIAGNOSIS — E119 Type 2 diabetes mellitus without complications: Secondary | ICD-10-CM | POA: Diagnosis present

## 2016-09-14 DIAGNOSIS — M1712 Unilateral primary osteoarthritis, left knee: Secondary | ICD-10-CM | POA: Diagnosis not present

## 2016-09-14 DIAGNOSIS — Z23 Encounter for immunization: Secondary | ICD-10-CM | POA: Diagnosis not present

## 2016-09-14 DIAGNOSIS — D62 Acute posthemorrhagic anemia: Secondary | ICD-10-CM | POA: Diagnosis not present

## 2016-09-14 DIAGNOSIS — G8918 Other acute postprocedural pain: Secondary | ICD-10-CM | POA: Diagnosis not present

## 2016-09-14 DIAGNOSIS — I110 Hypertensive heart disease with heart failure: Secondary | ICD-10-CM | POA: Diagnosis not present

## 2016-09-14 DIAGNOSIS — Z96652 Presence of left artificial knee joint: Secondary | ICD-10-CM | POA: Diagnosis not present

## 2016-09-14 DIAGNOSIS — M109 Gout, unspecified: Secondary | ICD-10-CM | POA: Diagnosis present

## 2016-09-14 DIAGNOSIS — Z471 Aftercare following joint replacement surgery: Secondary | ICD-10-CM | POA: Diagnosis not present

## 2016-09-14 HISTORY — DX: Malignant neoplasm of prostate: C61

## 2016-09-14 HISTORY — DX: Pure hypercholesterolemia, unspecified: E78.00

## 2016-09-14 HISTORY — PX: TOTAL KNEE ARTHROPLASTY: SHX125

## 2016-09-14 HISTORY — DX: Gastrointestinal hemorrhage, unspecified: K92.2

## 2016-09-14 HISTORY — DX: Type 2 diabetes mellitus without complications: E11.9

## 2016-09-14 HISTORY — DX: Malignant neoplasm of colon, unspecified: C18.9

## 2016-09-14 LAB — GLUCOSE, CAPILLARY
GLUCOSE-CAPILLARY: 79 mg/dL (ref 65–99)
Glucose-Capillary: 74 mg/dL (ref 65–99)
Glucose-Capillary: 105 mg/dL — ABNORMAL HIGH (ref 65–99)
Glucose-Capillary: 96 mg/dL (ref 65–99)
Glucose-Capillary: 86 mg/dL (ref 65–99)

## 2016-09-14 SURGERY — ARTHROPLASTY, KNEE, TOTAL
Anesthesia: Spinal | Site: Knee | Laterality: Left

## 2016-09-14 MED ORDER — CHLORHEXIDINE GLUCONATE 4 % EX LIQD: 60.0000 mL | Freq: Once | CUTANEOUS | Status: DC

## 2016-09-14 MED ORDER — CEFAZOLIN SODIUM-DEXTROSE 2-4 GM/100ML-% IV SOLN
2.0000 g | INTRAVENOUS | Status: AC
  Administered 2016-09-14: 2 g via INTRAVENOUS

## 2016-09-14 MED ORDER — POLYETHYLENE GLYCOL 3350 17 G PO PACK: 17.0000 g | PACK | Freq: Every day | ORAL | Status: DC | PRN

## 2016-09-14 MED ORDER — ONDANSETRON HCL 4 MG PO TABS: 4.0000 mg | ORAL_TABLET | Freq: Four times a day (QID) | ORAL | Status: DC | PRN

## 2016-09-14 MED ORDER — METHOCARBAMOL 500 MG PO TABS
ORAL_TABLET | ORAL | Status: AC
  Filled 2016-09-14: qty 1

## 2016-09-14 MED ORDER — DIPHENHYDRAMINE HCL 25 MG PO CAPS: 25.0000 mg | ORAL_CAPSULE | Freq: Four times a day (QID) | ORAL | Status: DC | PRN

## 2016-09-14 MED ORDER — PROPOFOL 10 MG/ML IV BOLUS
INTRAVENOUS | Status: DC | PRN
  Administered 2016-09-14: 50 mg via INTRAVENOUS

## 2016-09-14 MED ORDER — DEXAMETHASONE SODIUM PHOSPHATE 10 MG/ML IJ SOLN
10.0000 mg | Freq: Once | INTRAMUSCULAR | Status: AC
  Administered 2016-09-15: 10 mg via INTRAVENOUS
  Filled 2016-09-14: qty 1

## 2016-09-14 MED ORDER — KETOROLAC TROMETHAMINE 30 MG/ML IJ SOLN
15.0000 mg | Freq: Four times a day (QID) | INTRAMUSCULAR | Status: DC
  Administered 2016-09-14 – 2016-09-16 (×7): 15 mg via INTRAVENOUS
  Filled 2016-09-14 (×7): qty 1

## 2016-09-14 MED ORDER — TRANEXAMIC ACID 1000 MG/10ML IV SOLN
2000.0000 mg | Freq: Once | INTRAVENOUS | Status: DC
  Filled 2016-09-14: qty 20

## 2016-09-14 MED ORDER — ACETAMINOPHEN 650 MG RE SUPP: 650.0000 mg | Freq: Four times a day (QID) | RECTAL | Status: DC | PRN

## 2016-09-14 MED ORDER — ROPIVACAINE HCL 7.5 MG/ML IJ SOLN
INTRAMUSCULAR | Status: DC | PRN
  Administered 2016-09-14: 20 mL via PERINEURAL

## 2016-09-14 MED ORDER — BENAZEPRIL HCL 20 MG PO TABS
20.0000 mg | ORAL_TABLET | Freq: Every day | ORAL | Status: DC
  Administered 2016-09-14 – 2016-09-16 (×3): 20 mg via ORAL
  Filled 2016-09-14 (×3): qty 1

## 2016-09-14 MED ORDER — METHOCARBAMOL 500 MG PO TABS
500.0000 mg | ORAL_TABLET | Freq: Four times a day (QID) | ORAL | Status: DC | PRN
  Administered 2016-09-14: 500 mg via ORAL

## 2016-09-14 MED ORDER — MIDAZOLAM HCL 2 MG/2ML IJ SOLN
INTRAMUSCULAR | Status: AC
  Administered 2016-09-14: 1 mg
  Filled 2016-09-14: qty 2

## 2016-09-14 MED ORDER — CEFAZOLIN SODIUM-DEXTROSE 2-4 GM/100ML-% IV SOLN
INTRAVENOUS | Status: AC
  Filled 2016-09-14: qty 100

## 2016-09-14 MED ORDER — OXYCODONE HCL 5 MG PO TABS
5.0000 mg | ORAL_TABLET | ORAL | Status: DC | PRN
  Administered 2016-09-14: 5 mg via ORAL
  Administered 2016-09-15: 10 mg via ORAL
  Filled 2016-09-14: qty 2

## 2016-09-14 MED ORDER — AMLODIPINE BESYLATE 10 MG PO TABS
10.0000 mg | ORAL_TABLET | Freq: Every day | ORAL | Status: DC
  Administered 2016-09-15 – 2016-09-16 (×2): 10 mg via ORAL
  Filled 2016-09-14 (×2): qty 1

## 2016-09-14 MED ORDER — ACETAMINOPHEN 325 MG PO TABS: 650.0000 mg | ORAL_TABLET | Freq: Four times a day (QID) | ORAL | Status: DC | PRN

## 2016-09-14 MED ORDER — SALINE SPRAY 0.65 % NA SOLN
1.0000 | NASAL | Status: DC | PRN
  Filled 2016-09-14: qty 44

## 2016-09-14 MED ORDER — HYDROCHLOROTHIAZIDE 12.5 MG PO CAPS
12.5000 mg | ORAL_CAPSULE | Freq: Every day | ORAL | Status: DC
  Administered 2016-09-14 – 2016-09-16 (×3): 12.5 mg via ORAL
  Filled 2016-09-14 (×3): qty 1

## 2016-09-14 MED ORDER — PROPOFOL 10 MG/ML IV BOLUS
INTRAVENOUS | Status: AC
  Filled 2016-09-14: qty 20

## 2016-09-14 MED ORDER — FENTANYL CITRATE (PF) 100 MCG/2ML IJ SOLN
INTRAMUSCULAR | Status: AC
Start: 2016-09-14 — End: 2016-09-14
  Filled 2016-09-14: qty 2

## 2016-09-14 MED ORDER — KETOROLAC TROMETHAMINE 15 MG/ML IJ SOLN
INTRAMUSCULAR | Status: AC
  Filled 2016-09-14: qty 1

## 2016-09-14 MED ORDER — SODIUM CHLORIDE 0.9 % IR SOLN
Status: DC | PRN
  Administered 2016-09-14: 3000 mL

## 2016-09-14 MED ORDER — FENTANYL CITRATE (PF) 100 MCG/2ML IJ SOLN
INTRAMUSCULAR | Status: AC
  Administered 2016-09-14: 100 ug
  Filled 2016-09-14: qty 2

## 2016-09-14 MED ORDER — METOCLOPRAMIDE HCL 5 MG/ML IJ SOLN: 5.0000 mg | Freq: Three times a day (TID) | INTRAMUSCULAR | Status: DC | PRN

## 2016-09-14 MED ORDER — SENNOSIDES-DOCUSATE SODIUM 8.6-50 MG PO TABS: 1.0000 | ORAL_TABLET | Freq: Every evening | ORAL | 1 refills | Status: DC | PRN

## 2016-09-14 MED ORDER — INSULIN GLARGINE 100 UNIT/ML ~~LOC~~ SOLN
60.0000 [IU] | Freq: Every day | SUBCUTANEOUS | Status: DC
  Administered 2016-09-14 – 2016-09-15 (×2): 60 [IU] via SUBCUTANEOUS
  Filled 2016-09-14 (×2): qty 0.6

## 2016-09-14 MED ORDER — OXYCODONE HCL ER 10 MG PO T12A: 10.0000 mg | EXTENDED_RELEASE_TABLET | Freq: Two times a day (BID) | ORAL | 0 refills | Status: DC

## 2016-09-14 MED ORDER — LIDOCAINE HCL (CARDIAC) 20 MG/ML IV SOLN
INTRAVENOUS | Status: DC | PRN
  Administered 2016-09-14: 100 mg via INTRATRACHEAL

## 2016-09-14 MED ORDER — ALLOPURINOL 100 MG PO TABS
200.0000 mg | ORAL_TABLET | Freq: Every day | ORAL | Status: DC
  Administered 2016-09-14 – 2016-09-16 (×3): 200 mg via ORAL
  Filled 2016-09-14 (×3): qty 2

## 2016-09-14 MED ORDER — ONDANSETRON HCL 4 MG PO TABS: 4.0000 mg | ORAL_TABLET | Freq: Three times a day (TID) | ORAL | 0 refills | Status: DC | PRN

## 2016-09-14 MED ORDER — PROMETHAZINE HCL 25 MG/ML IJ SOLN: 6.2500 mg | INTRAMUSCULAR | Status: DC | PRN

## 2016-09-14 MED ORDER — INSULIN ASPART 100 UNIT/ML ~~LOC~~ SOLN
0.0000 [IU] | Freq: Three times a day (TID) | SUBCUTANEOUS | Status: DC
  Administered 2016-09-15: 5 [IU] via SUBCUTANEOUS
  Administered 2016-09-15: 3 [IU] via SUBCUTANEOUS
  Administered 2016-09-15: 5 [IU] via SUBCUTANEOUS
  Administered 2016-09-16: 3 [IU] via SUBCUTANEOUS

## 2016-09-14 MED ORDER — PROPOFOL 500 MG/50ML IV EMUL
INTRAVENOUS | Status: DC | PRN
  Administered 2016-09-14: 75 ug/kg/min via INTRAVENOUS

## 2016-09-14 MED ORDER — DEXTROSE 50 % IV SOLN
25.0000 mL | Freq: Once | INTRAVENOUS | Status: AC
  Administered 2016-09-14: 25 mL via INTRAVENOUS
  Filled 2016-09-14: qty 50

## 2016-09-14 MED ORDER — SORBITOL 70 % SOLN: 30.0000 mL | Freq: Every day | Status: DC | PRN

## 2016-09-14 MED ORDER — DIPHENHYDRAMINE HCL 12.5 MG/5ML PO ELIX: 25.0000 mg | ORAL_SOLUTION | ORAL | Status: DC | PRN

## 2016-09-14 MED ORDER — METOCLOPRAMIDE HCL 5 MG PO TABS: 5.0000 mg | ORAL_TABLET | Freq: Three times a day (TID) | ORAL | Status: DC | PRN

## 2016-09-14 MED ORDER — MORPHINE SULFATE (PF) 2 MG/ML IV SOLN: 1.0000 mg | INTRAVENOUS | Status: DC | PRN

## 2016-09-14 MED ORDER — DEXTROSE 50 % IV SOLN
INTRAVENOUS | Status: AC
  Filled 2016-09-14: qty 50

## 2016-09-14 MED ORDER — INSULIN ASPART 100 UNIT/ML ~~LOC~~ SOLN: 0.0000 [IU] | Freq: Every day | SUBCUTANEOUS | Status: DC

## 2016-09-14 MED ORDER — TRANEXAMIC ACID 1000 MG/10ML IV SOLN
1000.0000 mg | Freq: Once | INTRAVENOUS | Status: AC
  Administered 2016-09-14: 1000 mg via INTRAVENOUS
  Filled 2016-09-14: qty 10

## 2016-09-14 MED ORDER — 0.9 % SODIUM CHLORIDE (POUR BTL) OPTIME
TOPICAL | Status: DC | PRN
  Administered 2016-09-14: 1000 mL

## 2016-09-14 MED ORDER — METHOCARBAMOL 750 MG PO TABS: 750.0000 mg | ORAL_TABLET | Freq: Two times a day (BID) | ORAL | 0 refills | Status: DC | PRN

## 2016-09-14 MED ORDER — PHENOL 1.4 % MT LIQD: 1.0000 | OROMUCOSAL | Status: DC | PRN

## 2016-09-14 MED ORDER — LACTATED RINGERS IV SOLN
INTRAVENOUS | Status: DC
  Administered 2016-09-14 (×2): via INTRAVENOUS

## 2016-09-14 MED ORDER — SODIUM CHLORIDE 0.9 % IJ SOLN
INTRAMUSCULAR | Status: DC | PRN
  Administered 2016-09-14: 40 mL

## 2016-09-14 MED ORDER — HYDROMORPHONE HCL 1 MG/ML IJ SOLN: 0.2500 mg | INTRAMUSCULAR | Status: DC | PRN

## 2016-09-14 MED ORDER — ALUM & MAG HYDROXIDE-SIMETH 200-200-20 MG/5ML PO SUSP: 30.0000 mL | ORAL | Status: DC | PRN

## 2016-09-14 MED ORDER — TRANEXAMIC ACID 1000 MG/10ML IV SOLN
INTRAVENOUS | Status: DC | PRN
  Administered 2016-09-14: 2000 mg via TOPICAL

## 2016-09-14 MED ORDER — CEFAZOLIN SODIUM-DEXTROSE 2-4 GM/100ML-% IV SOLN
2.0000 g | Freq: Four times a day (QID) | INTRAVENOUS | Status: AC
  Administered 2016-09-14 – 2016-09-15 (×2): 2 g via INTRAVENOUS
  Filled 2016-09-14 (×2): qty 100

## 2016-09-14 MED ORDER — ACETAMINOPHEN 500 MG PO TABS
1000.0000 mg | ORAL_TABLET | Freq: Four times a day (QID) | ORAL | Status: AC
  Administered 2016-09-14 – 2016-09-15 (×4): 1000 mg via ORAL
  Filled 2016-09-14 (×4): qty 2

## 2016-09-14 MED ORDER — ATORVASTATIN CALCIUM 20 MG PO TABS
20.0000 mg | ORAL_TABLET | Freq: Every day | ORAL | Status: DC
  Administered 2016-09-14 – 2016-09-16 (×3): 20 mg via ORAL
  Filled 2016-09-14 (×3): qty 1

## 2016-09-14 MED ORDER — MENTHOL 3 MG MT LOZG: 1.0000 | LOZENGE | OROMUCOSAL | Status: DC | PRN

## 2016-09-14 MED ORDER — COLCHICINE 0.6 MG PO TABS: 0.6000 mg | ORAL_TABLET | ORAL | Status: DC | PRN

## 2016-09-14 MED ORDER — FENTANYL CITRATE (PF) 100 MCG/2ML IJ SOLN
INTRAMUSCULAR | Status: DC | PRN
  Administered 2016-09-14 (×2): 50 ug via INTRAVENOUS

## 2016-09-14 MED ORDER — NEBIVOLOL HCL 10 MG PO TABS
20.0000 mg | ORAL_TABLET | Freq: Every day | ORAL | Status: DC
  Administered 2016-09-15 – 2016-09-16 (×2): 20 mg via ORAL
  Filled 2016-09-14 (×2): qty 4
  Filled 2016-09-14: qty 2
  Filled 2016-09-14: qty 4
  Filled 2016-09-14: qty 2

## 2016-09-14 MED ORDER — BENAZEPRIL-HYDROCHLOROTHIAZIDE 20-12.5 MG PO TABS: 1.0000 | ORAL_TABLET | Freq: Every day | ORAL | Status: DC

## 2016-09-14 MED ORDER — PROMETHAZINE HCL 25 MG PO TABS: 25.0000 mg | ORAL_TABLET | Freq: Four times a day (QID) | ORAL | 1 refills | Status: DC | PRN

## 2016-09-14 MED ORDER — METHOCARBAMOL 1000 MG/10ML IJ SOLN
500.0000 mg | Freq: Four times a day (QID) | INTRAVENOUS | Status: DC | PRN
  Filled 2016-09-14: qty 5

## 2016-09-14 MED ORDER — INFLUENZA VAC SPLIT QUAD 0.5 ML IM SUSY
0.5000 mL | PREFILLED_SYRINGE | INTRAMUSCULAR | Status: AC
  Administered 2016-09-15: 0.5 mL via INTRAMUSCULAR
  Filled 2016-09-14: qty 0.5

## 2016-09-14 MED ORDER — OXYCODONE HCL ER 10 MG PO T12A
10.0000 mg | EXTENDED_RELEASE_TABLET | Freq: Two times a day (BID) | ORAL | Status: DC
  Administered 2016-09-14 – 2016-09-16 (×4): 10 mg via ORAL
  Filled 2016-09-14 (×4): qty 1

## 2016-09-14 MED ORDER — MAGNESIUM CITRATE PO SOLN: 1.0000 | Freq: Once | ORAL | Status: DC | PRN

## 2016-09-14 MED ORDER — OXYCODONE HCL 5 MG PO TABS: 5.0000 mg | ORAL_TABLET | ORAL | 0 refills | Status: DC | PRN

## 2016-09-14 MED ORDER — EPHEDRINE SULFATE 50 MG/ML IJ SOLN
INTRAMUSCULAR | Status: DC | PRN
  Administered 2016-09-14 (×2): 10 mg via INTRAVENOUS

## 2016-09-14 MED ORDER — BUPIVACAINE LIPOSOME 1.3 % IJ SUSP
INTRAMUSCULAR | Status: DC | PRN
Start: 2016-09-14 — End: 2016-09-14
  Administered 2016-09-14: 20 mL

## 2016-09-14 MED ORDER — ASPIRIN EC 325 MG PO TBEC: 325.0000 mg | DELAYED_RELEASE_TABLET | Freq: Two times a day (BID) | ORAL | 0 refills | Status: DC

## 2016-09-14 MED ORDER — HYDROMORPHONE HCL 1 MG/ML IJ SOLN
INTRAMUSCULAR | Status: AC
  Filled 2016-09-14: qty 1

## 2016-09-14 MED ORDER — ASPIRIN EC 325 MG PO TBEC
325.0000 mg | DELAYED_RELEASE_TABLET | Freq: Two times a day (BID) | ORAL | Status: DC
  Administered 2016-09-15 – 2016-09-16 (×3): 325 mg via ORAL
  Filled 2016-09-14 (×3): qty 1

## 2016-09-14 MED ORDER — KETOROLAC TROMETHAMINE 15 MG/ML IJ SOLN
30.0000 mg | Freq: Four times a day (QID) | INTRAMUSCULAR | Status: DC | PRN
  Administered 2016-09-14: 30 mg via INTRAVENOUS

## 2016-09-14 MED ORDER — SODIUM CHLORIDE 0.9 % IV SOLN
INTRAVENOUS | Status: DC
  Administered 2016-09-14: 19:00:00 via INTRAVENOUS

## 2016-09-14 MED ORDER — ONDANSETRON HCL 4 MG/2ML IJ SOLN: 4.0000 mg | Freq: Four times a day (QID) | INTRAMUSCULAR | Status: DC | PRN

## 2016-09-14 SURGICAL SUPPLY — 71 items
ALCOHOL ISOPROPYL (RUBBING) (MISCELLANEOUS) ×3 IMPLANT
APL SKNCLS STERI-STRIP NONHPOA (GAUZE/BANDAGES/DRESSINGS) ×1
BAG DECANTER FOR FLEXI CONT (MISCELLANEOUS) ×3 IMPLANT
BANDAGE ACE 6X5 VEL STRL LF (GAUZE/BANDAGES/DRESSINGS) ×2 IMPLANT
BANDAGE ESMARK 6X9 LF (GAUZE/BANDAGES/DRESSINGS) ×1 IMPLANT
BENZOIN TINCTURE PRP APPL 2/3 (GAUZE/BANDAGES/DRESSINGS) ×3 IMPLANT
BLADE SAW SGTL 13.0X1.19X90.0M (BLADE) ×3 IMPLANT
BNDG CMPR 9X6 STRL LF SNTH (GAUZE/BANDAGES/DRESSINGS) ×1
BNDG CMPR MED 10X6 ELC LF (GAUZE/BANDAGES/DRESSINGS) ×1
BNDG ELASTIC 6X10 VLCR STRL LF (GAUZE/BANDAGES/DRESSINGS) ×2 IMPLANT
BNDG ESMARK 6X9 LF (GAUZE/BANDAGES/DRESSINGS) ×3
BONE CEMENT PALACOS R-G (Orthopedic Implant) ×6 IMPLANT
BOWL SMART MIX CTS (DISPOSABLE) ×3 IMPLANT
CAPT KNEE TOTAL 3 ×2 IMPLANT
CEMENT BONE PALACOS R-G (Orthopedic Implant) ×2 IMPLANT
CLOSURE STERI-STRIP 1/2X4 (GAUZE/BANDAGES/DRESSINGS) ×1
CLSR STERI-STRIP ANTIMIC 1/2X4 (GAUZE/BANDAGES/DRESSINGS) ×3 IMPLANT
COVER SURGICAL LIGHT HANDLE (MISCELLANEOUS) ×3 IMPLANT
CUFF TOURNIQUET SINGLE 34IN LL (TOURNIQUET CUFF) ×3 IMPLANT
CUFF TOURNIQUET SINGLE 44IN (TOURNIQUET CUFF) IMPLANT
DRAPE HALF SHEET 40X57 (DRAPES) ×5 IMPLANT
DRAPE INCISE IOBAN 66X45 STRL (DRAPES) IMPLANT
DRAPE ORTHO SPLIT 77X108 STRL (DRAPES) ×6
DRAPE PROXIMA HALF (DRAPES) ×3 IMPLANT
DRAPE SURG 17X11 SM STRL (DRAPES) ×6 IMPLANT
DRAPE SURG ORHT 6 SPLT 77X108 (DRAPES) ×2 IMPLANT
DRSG AQUACEL AG ADV 3.5X14 (GAUZE/BANDAGES/DRESSINGS) ×3 IMPLANT
DURAPREP 26ML APPLICATOR (WOUND CARE) ×9 IMPLANT
ELECT CAUTERY BLADE 6.4 (BLADE) ×3 IMPLANT
ELECT REM PT RETURN 9FT ADLT (ELECTROSURGICAL) ×3
ELECTRODE REM PT RTRN 9FT ADLT (ELECTROSURGICAL) ×1 IMPLANT
GLOVE SKINSENSE NS SZ7.5 (GLOVE) ×2
GLOVE SKINSENSE STRL SZ7.5 (GLOVE) ×1 IMPLANT
GLOVE SURG SYN 7.5  E (GLOVE) ×8
GLOVE SURG SYN 7.5 E (GLOVE) ×4 IMPLANT
GLOVE SURG SYN 7.5 PF PI (GLOVE) ×4 IMPLANT
GOWN STRL REIN XL XLG (GOWN DISPOSABLE) ×3 IMPLANT
GOWN STRL REUS W/ TWL LRG LVL3 (GOWN DISPOSABLE) ×1 IMPLANT
GOWN STRL REUS W/TWL 2XL LVL3 (GOWN DISPOSABLE) ×2 IMPLANT
GOWN STRL REUS W/TWL LRG LVL3 (GOWN DISPOSABLE) ×3
HANDPIECE INTERPULSE COAX TIP (DISPOSABLE) ×3
HOOD PEEL AWAY FLYTE STAYCOOL (MISCELLANEOUS) ×6 IMPLANT
KIT BASIN OR (CUSTOM PROCEDURE TRAY) ×3 IMPLANT
KIT ROOM TURNOVER OR (KITS) ×3 IMPLANT
MANIFOLD NEPTUNE II (INSTRUMENTS) ×3 IMPLANT
NDL SPNL 18GX3.5 QUINCKE PK (NEEDLE) ×1 IMPLANT
NEEDLE SPNL 18GX3.5 QUINCKE PK (NEEDLE) ×3 IMPLANT
NS IRRIG 1000ML POUR BTL (IV SOLUTION) ×3 IMPLANT
PACK TOTAL JOINT (CUSTOM PROCEDURE TRAY) ×3 IMPLANT
PAD ARMBOARD 7.5X6 YLW CONV (MISCELLANEOUS) ×6 IMPLANT
PEN SKIN MARKING BROAD (MISCELLANEOUS) ×3 IMPLANT
SAW OSC TIP CART 19.5X105X1.3 (SAW) ×3 IMPLANT
SEALER BIPOLAR AQUA 6.0 (INSTRUMENTS) ×3 IMPLANT
SET HNDPC FAN SPRY TIP SCT (DISPOSABLE) ×1 IMPLANT
STAPLER VISISTAT 35W (STAPLE) IMPLANT
SUCTION FRAZIER HANDLE 10FR (MISCELLANEOUS) ×2
SUCTION TUBE FRAZIER 10FR DISP (MISCELLANEOUS) ×1 IMPLANT
SUT ETHILON 2 0 FS 18 (SUTURE) IMPLANT
SUT MNCRL AB 3-0 PS2 18 (SUTURE) ×2 IMPLANT
SUT MNCRL AB 4-0 PS2 18 (SUTURE) IMPLANT
SUT VIC AB 0 CT1 27 (SUTURE) ×6
SUT VIC AB 0 CT1 27XBRD ANBCTR (SUTURE) ×2 IMPLANT
SUT VIC AB 1 CTX 27 (SUTURE) ×13 IMPLANT
SUT VIC AB 2-0 CT1 27 (SUTURE) ×12
SUT VIC AB 2-0 CT1 TAPERPNT 27 (SUTURE) ×3 IMPLANT
SYR 20CC LL (SYRINGE) ×1 IMPLANT
SYR 50ML LL SCALE MARK (SYRINGE) ×3 IMPLANT
TOWEL OR 17X24 6PK STRL BLUE (TOWEL DISPOSABLE) ×3 IMPLANT
TOWEL OR 17X26 10 PK STRL BLUE (TOWEL DISPOSABLE) ×3 IMPLANT
TRAY CATH 16FR W/PLASTIC CATH (SET/KITS/TRAYS/PACK) ×2 IMPLANT
WRAP KNEE MAXI GEL POST OP (GAUZE/BANDAGES/DRESSINGS) ×3 IMPLANT

## 2016-09-14 NOTE — Transfer of Care (Signed)
Immediate Anesthesia Transfer of Care Note  Patient: Damon Snyder.  Procedure(s) Performed: Procedure(s): LEFT TOTAL KNEE ARTHROPLASTY (Left)  Patient Location: PACU  Anesthesia Type:MAC, Regional, Spinal and MAC combined with regional for post-op pain  Level of Consciousness: awake and patient cooperative  Airway & Oxygen Therapy: Patient Spontanous Breathing  Post-op Assessment: Report given to RN and Post -op Vital signs reviewed and stable  Post vital signs: Reviewed and stable  Last Vitals:  Vitals:   09/14/16 1315 09/14/16 1320  BP:  (!) 182/57  Pulse: (!) 57 (!) 58  Resp: 15 16  Temp:      Last Pain: There were no vitals filed for this visit.    Patients Stated Pain Goal: 2 ([id number])  Complications: No apparent anesthesia complications

## 2016-09-14 NOTE — Anesthesia Postprocedure Evaluation (Signed)
Anesthesia Post Note  Patient: Damon Snyder.  Procedure(s) Performed: Procedure(s) (LRB): LEFT TOTAL KNEE ARTHROPLASTY (Left)  Patient location during evaluation: PACU Anesthesia Type: Spinal Level of consciousness: oriented and awake and alert Pain management: pain level controlled Vital Signs Assessment: post-procedure vital signs reviewed and stable Respiratory status: spontaneous breathing, respiratory function stable and patient connected to nasal cannula oxygen Cardiovascular status: blood pressure returned to baseline and stable Postop Assessment: no headache, no backache and spinal receding Anesthetic complications: no       Last Vitals:  Vitals:   09/14/16 1945 09/14/16 2006  BP: (!) 166/66 (!) 177/60  Pulse: 64 64  Resp: 14 16  Temp: 36.1 C     Last Pain:  Vitals:   09/14/16 2007  PainSc: 0-No pain                 Effie Berkshire

## 2016-09-14 NOTE — Discharge Instructions (Signed)

## 2016-09-14 NOTE — Progress Notes (Signed)
Orthopedic Tech Progress Note Patient Details:  Damon Snyder 1928-06-05 [id number]  CPM Left Knee CPM Left Knee: On Left Knee Flexion (Degrees): 90 Left Knee Extension (Degrees): 0 Additional Comments: foot roll   [place] Pink 09/14/2016, 7:20 PM

## 2016-09-14 NOTE — Anesthesia Procedure Notes (Addendum)
Anesthesia Regional Block:  Adductor canal block  Pre-Anesthetic Checklist: ,, timeout performed, Correct Patient, Correct Site, Correct Laterality, Correct Procedure, Correct Position, site marked, Risks and benefits discussed,  Surgical consent,  Pre-op evaluation,  At surgeon's request and post-op pain management  Laterality: Left and Lower  Prep: chloraprep       Needles:   Needle Type: Echogenic Stimulator Needle     Needle Length: 9cm 9 cm Needle Gauge: 21 and 21 G  Needle insertion depth: 6 cm   Additional Needles:  Procedures: ultrasound guided (picture in chart) Adductor canal block Narrative:  Start time: 09/14/2016 2:05 PM End time: 09/14/2016 2:15 PM Injection made incrementally with aspirations every 5 mL.  Performed by: Personally  Anesthesiologist: Myleigh Amara  Additional Notes: Tolerated well

## 2016-09-14 NOTE — Op Note (Signed)
Total Knee Arthroplasty Procedure Note Damon Snyder [id number] 09/14/2016   Preoperative diagnosis: Left knee osteoarthritis  Postoperative diagnosis:same  Operative procedure: Left total knee arthroplasty. CPT [phone]  Surgeon: N. Eduard Roux, MD  Assistants: Ky Barban, RNFA  Anesthesia: Spinal, regional  Tourniquet time: less than 90 mins  Implants used: S&N Legion Femur: 7 PS Tibia: 7 Patella: 32 mm, 7.5 thick Polyethylene: 13 mm  Indication: Damon Snyder. is a 82 y.o. year old male with a history of knee pain. Having failed conservative management, the patient elected to proceed with a total knee arthroplasty.  We have reviewed the risk and benefits of the surgery and they elected to proceed after voicing understanding.  Procedure:  After informed consent was obtained and understanding of the risk were voiced including but not limited to bleeding, infection, damage to surrounding structures including nerves and vessels, blood clots, leg length inequality and the failure to achieve desired results, the operative extremity was marked with verbal confirmation of the patient in the holding area.   The patient was then brought to the operating room and transported to the operating room table in the supine position.  A tourniquet was applied to the operative extremity around the upper thigh. The operative limb was then prepped and draped in the usual sterile fashion and preoperative antibiotics were administered.  A time out was performed prior to the start of surgery confirming the correct extremity, preoperative antibiotic administration, as well as team members, implants and instruments available for the case. Correct surgical site was also confirmed with preoperative radiographs. The limb was then elevated for exsanguination and the tourniquet was inflated. A midline incision was made and a standard medial parapatellar approach was performed.  The patella was prepared  and sized to a 32 mm.  A cover was placed on the patella for protection from retractors.  We then turned our attention to the femur. Posterior cruciate ligament was sacrificed. Start site was drilled in the femur and the intramedullary distal femoral cutting guide was placed, set at 5 degrees valgus, taking 9 mm of distal resection. The distal cut was made. Osteophytes were then removed. Next, the proximal tibial cutting guide was placed with appropriate slope, varus/valgus alignment and depth of resection. The proximal tibial cut was made. Gap blocks were then used to assess the extension gap and alignment, and appropriate soft tissue releases were performed. Attention was turned back to the femur, which was sized using the sizing guide to a size 7. Appropriate rotation of the femoral component was determined using epicondylar axis, Whiteside's line, and assessing the flexion gap under ligament tension. The appropriate size 4-in-1 cutting block was placed and cuts were made. Posterior femoral osteophytes and uncapped bone were then removed with the curved osteotome. The tibia was sized for a size 7 component. The femoral box-cutting guide was placed and prepared for a PS femoral component. Trial components were placed, and stability was checked in full extension, mid-flexion, and deep flexion. Proper tibial rotation was determined and marked.  The patella tracked well without a lateral release. Trial components were then removed and tibial preparation performed. A posterior capsular injection comprising of 20 cc of 1.3% exparel and 40 cc of normal saline was performed for postoperative pain control. The bony surfaces were irrigated with a pulse lavage and then dried. Bone cement was vacuum mixed on the back table, and the final components sized above were cemented into place. After cement had finished curing, excess  cement was removed. The stability of the construct was re-evaluated throughout a range of motion  and found to be acceptable. The trial liner was removed, the knee was copiously irrigated, and the knee was re-evaluated for any excess bone debris. The real polyethylene liner, 13 mm thick, was inserted and checked to ensure the locking mechanism had engaged appropriately. The tourniquet was deflated and hemostasis was achieved. The wound was irrigated with dilute betadine in normal saline, and then again with normal saline. A drain was not placed. Capsular closure was performed with a #1 vicryl, subcutaneous fat closed with a 0 vicryl suture, then subcutaneous tissue closed with interrupted 2.0 vicryl suture. The skin was then closed with a 3.0 monocryl. A sterile dressing was applied.   The patient was awakened in the operating room and taken to recovery in stable condition. All sponge, needle, and instrument counts were correct at the end of the case.  Position: supine  Complications: none.  Time Out: performed   Drains/Packing: none  Estimated blood loss: 100 cc  Returned to Recovery Room: in good condition.   Antibiotics: yes   Mechanical VTE (DVT) Prophylaxis: sequential compression devices, TED thigh-high  Chemical VTE (DVT) Prophylaxis: aspirin  Fluid Replacement  Crystalloid: see anesthesia record Blood: none  FFP: none   Specimens Removed: 1 to pathology   Sponge and Instrument Count Correct? yes   PACU: portable radiograph - knee AP and Lateral   Admission: inpatient status  Plan/RTC: Return in 2 weeks for wound check.   Weight Bearing/Load Lower Extremity: full   N. Eduard Roux, MD [place] 5:44 PM

## 2016-09-14 NOTE — Anesthesia Procedure Notes (Signed)
Procedure Name: MAC Date/Time: 09/14/2016 3:40 PM Performed by: Lance Coon Pre-anesthesia Checklist: Patient identified, Emergency Drugs available, Suction available, Patient being monitored and Timeout performed Patient Re-evaluated:Patient Re-evaluated prior to inductionOxygen Delivery Method: Simple face mask

## 2016-09-14 NOTE — H&P (Signed)
PREOPERATIVE H&P  Chief Complaint: left knee degenerative joint disease  HPI: Damon Snyder. is a 82 y.o. male who presents for surgical treatment of left knee degenerative joint disease.  He denies any changes in medical history.  Past Medical History:  Diagnosis Date  . Anemia    h/o   . Arthritis   . Cancer [facility])    colon  ( tx : chemo) & prostate - treated with radiation   . Diabetes mellitus   . Difficulty in walking(719.7)   . Dyspnea    relative to pain in her leg  . Foot ulcer ([place])   . Gout   . History of blood transfusion   . Hypertension    Past Surgical History:  Procedure Laterality Date  . COLON SURGERY    . EYE SURGERY Bilateral    cataracts removed & IOL   Social History   Social History  . Marital status: Married    Spouse name: N/A  . Number of children: N/A  . Years of education: N/A   Social History Main Topics  . Smoking status: Never Smoker  . Smokeless tobacco: Never Used  . Alcohol use No  . Drug use: No  . Sexual activity: Yes   Other Topics Concern  . None   Social History Narrative  . None   Family History  Problem Relation Age of Onset  . Hypertension Mother   . Cancer Sister     Ovarian  . Hypertension Father   . Heart disease Brother     After age 46  . Kidney disease Sister    No Known Allergies Prior to Admission medications   Medication Sig Start Date End Date Taking? Authorizing Provider  allopurinol (ZYLOPRIM) 100 MG tablet Take 200 mg by mouth daily as needed.    Yes Historical Provider, MD  amLODipine (NORVASC) 10 MG tablet Take 10 mg by mouth daily.   Yes Historical Provider, MD  aspirin 81 MG tablet Take 81 mg by mouth daily.   Yes Historical Provider, MD  atorvastatin (LIPITOR) 20 MG tablet Take 20 mg by mouth daily.   Yes Historical Provider, MD  benazepril-hydrochlorthiazide (LOTENSIN HCT) 20-12.5 MG per tablet Take 1 tablet by mouth daily.    Yes Historical Provider, MD  diphenhydrAMINE (BENADRYL)  25 mg capsule Take 25 mg by mouth every 6 (six) hours as needed for itching or allergies.   Yes Historical Provider, MD  LANTUS SOLOSTAR 100 UNIT/ML Solostar Pen Inject 60 Units into the skin at bedtime.  11/17/14  Yes Historical Provider, MD  Nebivolol HCl (BYSTOLIC) 20 MG TABS Take 20 mg by mouth daily.   Yes Historical Provider, MD  colchicine 0.6 MG tablet Take 0.6 mg by mouth every 4 (four) hours as needed (gout).     Historical Provider, MD  NOVOFINE 32G X 6 MM MISC  03/11/13   Historical Provider, MD  sodium chloride (OCEAN) 0.65 % SOLN nasal spray Place 1 spray into both nostrils as needed for congestion.    Historical Provider, MD     Positive ROS: All other systems have been reviewed and were otherwise negative with the exception of those mentioned in the HPI and as above.  Physical Exam: General: Alert, no acute distress Cardiovascular: No pedal edema Respiratory: No cyanosis, no use of accessory musculature GI: abdomen soft Skin: No lesions in the area of chief complaint Neurologic: Sensation intact distally Psychiatric: Patient is competent for consent with normal mood and  affect Lymphatic: no lymphedema  MUSCULOSKELETAL: exam stable  Assessment: left knee degenerative joint disease  Plan: Plan for Procedure(s): LEFT TOTAL KNEE ARTHROPLASTY  The risks benefits and alternatives were discussed with the patient including but not limited to the risks of nonoperative treatment, versus surgical intervention including infection, bleeding, nerve injury,  blood clots, cardiopulmonary complications, morbidity, mortality, among others, and they were willing to proceed.   Eduard Roux, MD   09/14/2016 2:41 PM

## 2016-09-14 NOTE — Anesthesia Preprocedure Evaluation (Signed)
Anesthesia Evaluation  Patient identified by MRN, date of birth, ID band Patient awake    Reviewed: Allergy & Precautions, NPO status , Patient's Chart, lab work & pertinent test results  History of Anesthesia Complications Negative for: history of anesthetic complications  Airway Mallampati: II  TM Distance: >3 FB Neck ROM: Full    Dental  (+) Teeth Intact   Pulmonary shortness of breath,    breath sounds clear to auscultation       Cardiovascular hypertension,  Rhythm:Regular Rate:Normal     Neuro/Psych negative neurological ROS     GI/Hepatic negative GI ROS, Neg liver ROS,   Endo/Other  diabetes, Well Controlled, Type 1  Renal/GU negative Renal ROS     Musculoskeletal  (+) Arthritis ,   Abdominal (+) + obese,   Peds  Hematology   Anesthesia Other Findings   Reproductive/Obstetrics                             Anesthesia Physical Anesthesia Plan  ASA: III  Anesthesia Plan: Spinal   Post-op Pain Management:  Regional for Post-op pain   Induction: Intravenous  Airway Management Planned: Simple Face Mask and Natural Airway  Additional Equipment:   Intra-op Plan:   Post-operative Plan:   Informed Consent: I have reviewed the patients History and Physical, chart, labs and discussed the procedure including the risks, benefits and alternatives for the proposed anesthesia with the patient or authorized representative who has indicated his/her understanding and acceptance.     Plan Discussed with: CRNA  Anesthesia Plan Comments:         Anesthesia Quick Evaluation

## 2016-09-15 LAB — CBC
HEMATOCRIT: 34.7 % — AB (ref 39.0–52.0)
HEMOGLOBIN: 10.6 g/dL — AB (ref 13.0–17.0)
MCH: 26.9 pg (ref 26.0–34.0)
MCHC: 30.5 g/dL (ref 30.0–36.0)
MCV: 88.1 fL (ref 78.0–100.0)
Platelets: 195 10*3/uL (ref 150–400)
RBC: 3.94 MIL/uL — ABNORMAL LOW (ref 4.22–5.81)
RDW: 13.1 % (ref 11.5–15.5)
WBC: 10.3 10*3/uL (ref 4.0–10.5)

## 2016-09-15 LAB — BASIC METABOLIC PANEL
ANION GAP: 7 (ref 5–15)
BUN: 19 mg/dL (ref 6–20)
CALCIUM: 8.7 mg/dL — AB (ref 8.9–10.3)
CHLORIDE: 107 mmol/L (ref 101–111)
CO2: 26 mmol/L (ref 22–32)
Creatinine, Ser: 1.46 mg/dL — ABNORMAL HIGH (ref 0.61–1.24)
GFR calc non Af Amer: 42 mL/min — ABNORMAL LOW (ref >60)
GFR, EST AFRICAN AMERICAN: 48 mL/min — AB (ref >60)
Glucose, Bld: 159 mg/dL — ABNORMAL HIGH (ref 65–99)
Potassium: 4.6 mmol/L (ref 3.5–5.1)
SODIUM: 140 mmol/L (ref 135–145)

## 2016-09-15 LAB — GLUCOSE, CAPILLARY
GLUCOSE-CAPILLARY: 157 mg/dL — AB (ref 65–99)
GLUCOSE-CAPILLARY: 206 mg/dL — AB (ref 65–99)
GLUCOSE-CAPILLARY: 211 mg/dL — AB (ref 65–99)
Glucose-Capillary: 165 mg/dL — ABNORMAL HIGH (ref 65–99)

## 2016-09-15 NOTE — Progress Notes (Signed)
   Subjective:  Patient reports pain as moderate.  No events.  Objective:   VITALS:   Vitals:   09/14/16 1945 09/14/16 2006 09/15/16 0024 09/15/16 0412  BP: (!) 166/66 (!) 177/60 (!) 165/64 (!) 168/61  Pulse: 64 64 61 (!) 58  Resp: 14 16 16 18   Temp: 97 F (36.1 C)   97.7 F (36.5 C)  TempSrc:    Oral  SpO2: 100% 98% 96% 97%  Weight:      Height:        Neurologically intact Neurovascular intact Sensation intact distally Intact pulses distally Dorsiflexion/Plantar flexion intact Incision: dressing C/D/I and no drainage No cellulitis present Compartment soft   Lab Results  Component Value Date   WBC 10.3 09/15/2016   HGB 10.6 (L) 09/15/2016   HCT 34.7 (L) 09/15/2016   MCV 88.1 09/15/2016   PLT 195 09/15/2016     Assessment/Plan:  1 Day Post-Op   - Expected postop acute blood loss anemia - will monitor for symptoms - Up with PT/OT - DVT ppx - SCDs, ambulation, aspirin - WBAT operative extremity, CPM - Pain control - Discharge planning - plan for SNF  Eduard Roux 09/15/2016, 6:47 AM [phone]

## 2016-09-15 NOTE — Progress Notes (Signed)
Physical Therapy Treatment Patient Details Name: Damon Snyder. MRN: [id number] DOB: May 11, 1928 Today's Date: 09/15/2016    History of Present Illness 82 y.o. male admitted for L TKA. PMH significant for HTN, DM type II, dyspnea, colon and prostate CA, anemia, gout, arthritis, and bilateral cataract sx.     PT Comments    Pt progressing very well with PT, ambulating 100 ft with rw and min guard assistance. Based upon the patient's current mobility, recommending D/C home with HHPT services. Pt states that he is moving better than he was prior to admission. PT to continue to follow and modify recommendations as needed.   Follow Up Recommendations  Home health PT;Supervision for mobility/OOB     Equipment Recommendations  Rolling walker with 5" wheels    Recommendations for Other Services       Precautions / Restrictions Precautions Precautions: Knee;Fall Restrictions Weight Bearing Restrictions: Yes LLE Weight Bearing: Weight bearing as tolerated    Mobility  Bed Mobility               General bed mobility comments: up in chair upon arrival  Transfers Overall transfer level: Needs assistance Equipment used: Rolling walker (2 wheeled) Transfers: Sit to/from Stand Sit to Stand: Min guard         General transfer comment: reinforcing hand placement  Ambulation/Gait Ambulation/Gait assistance: Min guard Ambulation Distance (Feet): 100 Feet Assistive device: Rolling walker (2 wheeled) Gait Pattern/deviations: Step-through pattern;Decreased step length - right;Decreased step length - left Gait velocity: decreased   General Gait Details: small strides but steady pattern   Stairs            Wheelchair Mobility    Modified Rankin (Stroke Patients Only)       Balance Overall balance assessment: Needs assistance Sitting-balance support: No upper extremity supported Sitting balance-Leahy Scale: Good     Standing balance support: No upper  extremity supported Standing balance-Leahy Scale: Fair                      Cognition Arousal/Alertness: Awake/alert Behavior During Therapy: WFL for tasks assessed/performed Overall Cognitive Status: Within Functional Limits for tasks assessed                      Exercises Total Joint Exercises Ankle Circles/Pumps: AROM;Both;20 reps Quad Sets: Strengthening;Left;10 reps Heel Slides: AAROM;Left;10 reps Straight Leg Raises: Strengthening;Left;10 reps Goniometric ROM: 90 degrees knee flexion    General Comments        Pertinent Vitals/Pain Pain Assessment: 0-10 Pain Score: 0-No pain Pain Intervention(s): Monitored during session    Home Living                      Prior Function            PT Goals (current goals can now be found in the care plan section) Acute Rehab PT Goals Patient Stated Goal: to be able to go home PT Goal Formulation: With patient Time For Goal Achievement: 09/29/16 Potential to Achieve Goals: Good Progress towards PT goals: Progressing toward goals    Frequency    7X/week      PT Plan Current plan remains appropriate    Co-evaluation             End of Session Equipment Utilized During Treatment: Gait belt Activity Tolerance: Patient tolerated treatment well Patient left: in chair;with call bell/phone within reach;with family/visitor present (in knee extension)  Time: 1336-1400 PT Time Calculation (min) (ACUTE ONLY): 24 min  Charges:  $Gait Training: 8-22 mins $Therapeutic Exercise: 8-22 mins                    G Codes:      Cassell Clement, PT, CSCS Pager [phone] Office [phone]  09/15/2016, 2:07 PM

## 2016-09-15 NOTE — Evaluation (Signed)
Physical Therapy Evaluation Patient Details Name: Damon Snyder. MRN: [id number] DOB: 1928-05-03 Today's Date: 09/15/2016   History of Present Illness  82 y.o. male admitted for L TKA. PMH significant for HTN, DM type II, dyspnea, colon and prostate CA, anemia, gout, arthritis, and bilateral cataract sx.   Clinical Impression  Pt is s/p Lt TKA resulting in the deficits listed below (see PT Problem List). Pt able to ambulate 30 ft with good stability using rw. Speed and distance are decreased but this is anticipated to continue to progress with continue PT. Based upon the patient's current mobility level, anticipate D/C to home with family assistance and HHPT services. Pt in agreement.     Follow Up Recommendations Home health PT;Supervision for mobility/OOB    Equipment Recommendations  Rolling walker with 5" wheels    Recommendations for Other Services       Precautions / Restrictions Precautions Precautions: Knee Precaution Booklet Issued: Yes (comment) Precaution Comments: HEP provided, reviewed knee extension precautions Restrictions Weight Bearing Restrictions: Yes LLE Weight Bearing: Weight bearing as tolerated      Mobility  Bed Mobility               General bed mobility comments: Pt standing in bathroom with OT upon arrival  Transfers Overall transfer level: Needs assistance Equipment used: Rolling walker (2 wheeled) Transfers: Sit to/from Stand Sit to Stand: Min guard         General transfer comment: cues to use hands to control descent, encouraging knee flexion as tolerated.   Ambulation/Gait Ambulation/Gait assistance: Min guard Ambulation Distance (Feet): 30 Feet Assistive device: Rolling walker (2 wheeled) Gait Pattern/deviations: Step-to pattern;Decreased stance time - left;Decreased weight shift to left Gait velocity: decreased   General Gait Details: steady pattern, no loss of balance  Stairs            Wheelchair Mobility     Modified Rankin (Stroke Patients Only)       Balance Overall balance assessment: Needs assistance Sitting-balance support: No upper extremity supported Sitting balance-Leahy Scale: Good     Standing balance support: No upper extremity supported Standing balance-Leahy Scale: Fair Standing balance comment: able to stand at sink without UE support                             Pertinent Vitals/Pain Pain Assessment: 0-10 Pain Score: 3  (initially had scale backward for rating) Pain Location: L knee Pain Descriptors / Indicators: Aching;Sore Pain Intervention(s): Limited activity within patient's tolerance;Monitored during session    [place] expects to be discharged to:: Private residence Living Arrangements: Spouse/significant other Available Help at Discharge: Family;Available 24 hours/day Type of Home: House Home Access: Ramped entrance     Home Layout: One level Home Equipment: Walker - standard;Cane - single point Additional Comments: pt states that his wife will be able to help him at home.     Prior Function Level of Independence: Independent with assistive device(s)         Comments: SPC     Hand Dominance   Dominant Hand: Right    Extremity/Trunk Assessment   Upper Extremity Assessment Upper Extremity Assessment: Overall WFL for tasks assessed    Lower Extremity Assessment Lower Extremity Assessment: LLE deficits/detail LLE Deficits / Details: able to raise LLE with minimal assistance    Cervical / Trunk Assessment Cervical / Trunk Assessment: Kyphotic  Communication   Communication: No difficulties  Cognition Arousal/Alertness:  Awake/alert Behavior During Therapy: WFL for tasks assessed/performed Overall Cognitive Status: Within Functional Limits for tasks assessed                      General Comments      Exercises     Assessment/Plan    PT Assessment Patient needs continued PT services  PT  Problem List Decreased strength;Decreased range of motion;Decreased activity tolerance;Decreased balance;Decreased mobility          PT Treatment Interventions DME instruction;Gait training;Stair training;Functional mobility training;Therapeutic activities;Therapeutic exercise;Patient/family education    PT Goals (Current goals can be found in the Care Plan section)  Acute Rehab PT Goals Patient Stated Goal: to be able to go home PT Goal Formulation: With patient Time For Goal Achievement: 09/29/16 Potential to Achieve Goals: Good    Frequency 7X/week   Barriers to discharge        Co-evaluation               End of Session Equipment Utilized During Treatment: Gait belt Activity Tolerance: Patient tolerated treatment well Patient left: in chair;with call bell/phone within reach (in knee extension) Nurse Communication: Mobility status;Weight bearing status         Time: [id number] PT Time Calculation (min) (ACUTE ONLY): 27 min   Charges:   PT Evaluation $PT Eval Moderate Complexity: 1 Procedure PT Treatments $Gait Training: 8-22 mins   PT G Codes:        Cassell Clement, PT, CSCS Pager [phone] Office [phone]  09/15/2016, 10:54 AM

## 2016-09-15 NOTE — Evaluation (Signed)
Occupational Therapy Evaluation Patient Details Name: Damon Snyder. MRN: [id number] DOB: 05/20/1928 Today's Date: 09/15/2016    History of Present Illness 82 y.o. male admitted for L TKA. PMH significant for HTN, DM type II, dyspnea, colon and prostate CA, anemia, gout, arthritis, and bilateral cataract sx.    Clinical Impression   Pt progressing well and required min guard assist for all functional mobility and transfers. Pt had some initial stumbling upon standing, but recovered well without physical assistance. Pt is unsure of d/c plan, but feels he is doing well enough to go home. Pt will have 24/7 assistance from his wife. Recommend 3in1 for home use and no OT follow-up.    Follow Up Recommendations  No OT follow up;Supervision/Assistance - 24 hour    Equipment Recommendations  3 in 1 bedside commode;Other (comment) (Rolling walker - 2 wheeled)    Recommendations for Other Services       Precautions / Restrictions Precautions Precautions: Knee Precaution Booklet Issued: No Restrictions Weight Bearing Restrictions: Yes LLE Weight Bearing: Weight bearing as tolerated      Mobility Bed Mobility               General bed mobility comments: Pt sitting EOB on OT arrival  Transfers Overall transfer level: Needs assistance   Transfers: Sit to/from Stand Sit to Stand: Min guard         General transfer comment: Min guard assist for safety and balance. Pt with slight stumbling gait initially, but recovered without assistance    Balance Overall balance assessment: Needs assistance Sitting-balance support: No upper extremity supported;Feet supported Sitting balance-Leahy Scale: Good     Standing balance support: Bilateral upper extremity supported;During functional activity Standing balance-Leahy Scale: Fair Standing balance comment: able to maintain static standing balance for grooming tasks                            ADL Overall ADL's : Needs  assistance/impaired Eating/Feeding: Set up;Sitting   Grooming: Wash/dry hands;Oral care;Set up;Standing   Upper Body Bathing: Set up;Sitting   Lower Body Bathing: Minimal assistance;Sit to/from stand Lower Body Bathing Details (indicate cue type and reason): difficulty reaching L foot         Toilet Transfer: Min guard;Cueing for safety;Ambulation;BSC   Toileting- Water quality scientist and Hygiene: Min guard;Sit to/from stand       Functional mobility during ADLs: Min guard;Rolling walker       Vision Vision Assessment?: No apparent visual deficits Additional Comments: Recently had bilateral cataract removal surgery   Perception     Praxis      Pertinent Vitals/Pain Pain Assessment: 0-10 Pain Score: 8  Pain Location: L knee Pain Descriptors / Indicators: Aching;Sore Pain Intervention(s): Limited activity within patient's tolerance;Monitored during session;Repositioned     Hand Dominance Right   Extremity/Trunk Assessment Upper Extremity Assessment Upper Extremity Assessment: Overall WFL for tasks assessed   Lower Extremity Assessment Lower Extremity Assessment: LLE deficits/detail LLE Deficits / Details: decreased strength and ROM as expected post op   Cervical / Trunk Assessment Cervical / Trunk Assessment: Kyphotic   Communication Communication Communication: No difficulties   Cognition Arousal/Alertness: Awake/alert Behavior During Therapy: WFL for tasks assessed/performed Overall Cognitive Status: Within Functional Limits for tasks assessed                     General Comments       Exercises  Shoulder Instructions      Home Living Family/patient expects to be discharged to:: Private residence Living Arrangements: Spouse/significant other Available Help at Discharge: Family;Available 24 hours/day Type of Home: House Home Access: Ramped entrance     Home Layout: One level     Bathroom Shower/Tub: Walk-in shower;Door    [employer] Toilet: Standard     Home Equipment: Walker - standard;Cane - single point;Shower seat - built in;Grab bars - tub/shower          Prior Functioning/Environment Level of Independence: Independent with assistive device(s)        Comments: Uses SPC at times.  Pt drives.        OT Problem List: Decreased strength;Decreased range of motion;Decreased activity tolerance;Impaired balance (sitting and/or standing);Decreased safety awareness;Decreased knowledge of use of DME or AE;Decreased knowledge of precautions;Obesity;Pain   OT Treatment/Interventions: Self-care/ADL training;Therapeutic exercise;DME and/or AE instruction;Therapeutic activities;Balance training;Patient/family education    OT Goals(Current goals can be found in the care plan section) Acute Rehab OT Goals Patient Stated Goal: to be able to go home OT Goal Formulation: With patient Time For Goal Achievement: 09/29/16 Potential to Achieve Goals: Good ADL Goals Pt Will Perform Lower Body Dressing: with set-up;sit to/from stand Pt Will Perform Tub/Shower Transfer: Shower transfer;with supervision;ambulating;shower seat;grab bars;rolling walker  OT Frequency: Min 2X/week   Barriers to D/C:            Co-evaluation              End of Session Equipment Utilized During Treatment: Gait belt;Rolling walker CPM Left Knee CPM Left Knee: Off Left Knee Flexion (Degrees): 60 Left Knee Extension (Degrees): 0 Nurse Communication: Mobility status  Activity Tolerance: Patient tolerated treatment well Patient left: Other (comment) (with PT)   Time: [id number] OT Time Calculation (min): 16 min Charges:  OT General Charges $OT Visit: 1 Procedure OT Evaluation $OT Eval Moderate Complexity: 1 Procedure G-Codes:    Redmond Baseman, OTR/L 09/20/16, 9:32 AM

## 2016-09-15 NOTE — Progress Notes (Signed)
Orthopedic Tech Progress Note Patient Details:  Damon Snyder 1927-10-04 [id number]  Patient ID: Damon Gillis., male   DOB: 1927-11-19, 82 y.o.   MRN: [id number] Applied cpm 0-60  Damon Snyder 09/15/2016, 6:06 AM

## 2016-09-16 LAB — GLUCOSE, CAPILLARY
GLUCOSE-CAPILLARY: 106 mg/dL — AB (ref 65–99)
Glucose-Capillary: 162 mg/dL — ABNORMAL HIGH (ref 65–99)

## 2016-09-16 NOTE — Progress Notes (Signed)
Physical Therapy Treatment Patient Details Name: Damon Snyder. MRN: [id number] DOB: 1928/06/08 Today's Date: 09/16/2016    History of Present Illness 82 y.o. male admitted for L TKA. PMH significant for HTN, DM type II, dyspnea, colon and prostate CA, anemia, gout, arthritis, and bilateral cataract sx.     PT Comments    Pt presents with no c/o pain this session with gait or exercises. Pt with consistent gait at 100' this session and decreased cadence noted. Improved ROM noted with knee flexion as noted below. Pt reports moving better now than before surgery with no dyspnea following activities.    Follow Up Recommendations  Home health PT;Supervision for mobility/OOB     Equipment Recommendations  Rolling walker with 5" wheels;3in1 (PT)    Recommendations for Other Services       Precautions / Restrictions Precautions Precautions: Knee;Fall Precaution Booklet Issued: Yes (comment) Precaution Comments: HEP provided, reviewed knee extension precautions Restrictions Weight Bearing Restrictions: Yes LLE Weight Bearing: Weight bearing as tolerated    Mobility  Bed Mobility Overal bed mobility: Needs Assistance Bed Mobility: Supine to Sit     Supine to sit: Modified independent (Device/Increase time)     General bed mobility comments: pt able to get EOB without assistance  Transfers Overall transfer level: Needs assistance Equipment used: Rolling walker (2 wheeled) Transfers: Sit to/from Stand Sit to Stand: Min guard         General transfer comment: reinforcing hand placement  Ambulation/Gait Ambulation/Gait assistance: Min guard Ambulation Distance (Feet): 100 Feet Assistive device: Rolling walker (2 wheeled) Gait Pattern/deviations: Step-through pattern;Decreased step length - right;Decreased step length - left Gait velocity: decreased Gait velocity interpretation: Below normal speed for age/gender General Gait Details: decreased stride length bilateral  LE's, cues for proper maneuvering of RW   Stairs            Wheelchair Mobility    Modified Rankin (Stroke Patients Only)       Balance Overall balance assessment: Needs assistance Sitting-balance support: No upper extremity supported Sitting balance-Leahy Scale: Good Sitting balance - Comments: sitting EOB no back support   Standing balance support: No upper extremity supported Standing balance-Leahy Scale: Fair Standing balance comment: able to stand at sink without UE support                    Cognition Arousal/Alertness: Awake/alert Behavior During Therapy: WFL for tasks assessed/performed Overall Cognitive Status: Within Functional Limits for tasks assessed                      Exercises Total Joint Exercises Long Arc Quad: AROM;Left;10 reps;Seated Knee Flexion: AROM;Left;10 reps;Seated Goniometric ROM: 0-100    General Comments        Pertinent Vitals/Pain Pain Assessment: 0-10 Pain Score: 0-No pain Pain Location: L knee Pain Intervention(s): Monitored during session;Ice applied    Home Living                      Prior Function            PT Goals (current goals can now be found in the care plan section) Acute Rehab PT Goals Patient Stated Goal: to be able to go home Progress towards PT goals: Progressing toward goals    Frequency    7X/week      PT Plan Current plan remains appropriate    Co-evaluation  End of Session Equipment Utilized During Treatment: Gait belt Activity Tolerance: Patient tolerated treatment well Patient left: in chair;with call bell/phone within reach     Time: 0754-0824 PT Time Calculation (min) (ACUTE ONLY): 30 min  Charges:  $Gait Training: 8-22 mins $Therapeutic Exercise: 8-22 mins $Therapeutic Activity: 8-22 mins                    G Codes:      Scheryl Marten PT, DPT  [phone]  09/16/2016, 8:31 AM

## 2016-09-16 NOTE — Care Management Note (Signed)
82 yo M s/p L TKA  Received referral to assist with [facility] and DME needs.  Met with pt and wife. D/C plan is for pt to return home with the support of his wife and brothers.   PT is recommending HHPT, RW, and a 3-in-1 BSC.  Provided pt with a list of [place] agencies.  His wife wants to use [facility]. She stated that her mother used them before and she liked them.  Contacted Jeneen Rinks and Haynes at [facility] for [facility] and DME referrals.

## 2016-09-16 NOTE — Progress Notes (Addendum)
Occupational Therapy Treatment/Discharge Patient Details Name: Damon Snyder. MRN: [id number] DOB: January 18, 1928 Today's Date: 09/16/2016    History of present illness 82 y.o. male admitted for L TKA. PMH significant for HTN, DM type II, dyspnea, colon and prostate CA, anemia, gout, arthritis, and bilateral cataract sx.    OT comments  Pt progressing well toward OT goals. Pt educated on safe shower transfer and able to complete with min assist. Pt with improvement in independence with toilet transfer and LB ADL and able to complete these with supervision for safety. Also discussed fall prevention strategies with pt and provided handout. Pt reports understanding and no further questions/concerns. No further acute OT needs identified. OT will sign off. D/C plan remains appropriate.   Follow Up Recommendations  No OT follow up;Supervision/Assistance - 24 hour    Equipment Recommendations  3 in 1 bedside commode;Other (comment) (Rolling walker- 2 wheeled)    Recommendations for Other Services      Precautions / Restrictions Precautions Precautions: Knee;Fall Precaution Booklet Issued: Yes (comment) Precaution Comments: Reviewed precautions during ADL Restrictions Weight Bearing Restrictions: Yes LLE Weight Bearing: Weight bearing as tolerated       Mobility Bed Mobility Overal bed mobility: Needs Assistance Bed Mobility: Supine to Sit     Supine to sit: Modified independent (Device/Increase time)     General bed mobility comments: pt able to get EOB without assistance  Transfers Overall transfer level: Needs assistance Equipment used: Rolling walker (2 wheeled) Transfers: Sit to/from Stand Sit to Stand: Supervision         General transfer comment: reinforcing hand placement    Balance Overall balance assessment: Needs assistance Sitting-balance support: No upper extremity supported Sitting balance-Leahy Scale: Good Sitting balance - Comments: sitting EOB no back  support   Standing balance support: No upper extremity supported;Bilateral upper extremity supported;During functional activity Standing balance-Leahy Scale: Fair Standing balance comment: able to stand statically without UE support for ADL                   ADL Overall ADL's : Needs assistance/impaired                     Lower Body Dressing: Supervision/safety;Sit to/from stand   Toilet Transfer: Supervision/safety;BSC;Ambulation;RW       Tub/ Banker: Minimal assistance;Walk-in shower;Ambulation;Shower seat;Rolling walker   Functional mobility during ADLs: Supervision/safety;Rolling walker General ADL Comments: Educated pt on safe shower transfers and dressing techniques. Also discussed fall prevention strategies for the home and reviewed handout.      Vision                     Perception     Praxis      Cognition   Behavior During Therapy: [facility] for tasks assessed/performed Overall Cognitive Status: Within Functional Limits for tasks assessed                       Extremity/Trunk Assessment               Exercises Total Joint Exercises Long Arc Quad: AROM;Left;10 reps;Seated Knee Flexion: AROM;Left;10 reps;Seated Goniometric ROM: 0-100   Shoulder Instructions       General Comments      Pertinent Vitals/ Pain       Pain Assessment: Faces Pain Score: 0-No pain Faces Pain Scale: Hurts little more Pain Location: L knee Pain Descriptors / Indicators: Aching;Sore Pain Intervention(s): Limited activity within patient's tolerance;Monitored  during session;Repositioned;Ice applied  Home Living                                          Prior Functioning/Environment              Frequency  Min 2X/week        Progress Toward Goals  OT Goals(current goals can now be found in the care plan section)  Progress towards OT goals: Progressing toward goals  Acute Rehab OT Goals Patient Stated  Goal: to be able to go home OT Goal Formulation: With patient Time For Goal Achievement: 09/29/16 Potential to Achieve Goals: Good ADL Goals Pt Will Perform Lower Body Dressing: with set-up;sit to/from stand Pt Will Perform Tub/Shower Transfer: Shower transfer;with supervision;ambulating;shower seat;grab bars;rolling walker  Plan Discharge plan remains appropriate    Co-evaluation                 End of Session Equipment Utilized During Treatment: Gait belt;Rolling walker CPM Left Knee CPM Left Knee: On Left Knee Flexion (Degrees): 0 Left Knee Extension (Degrees): 60   Activity Tolerance Patient tolerated treatment well   Patient Left in chair;with call bell/phone within reach   Nurse Communication          Time: [id number] OT Time Calculation (min): 29 min  Charges: OT General Charges $OT Visit: 1 Procedure OT Treatments $Self Care/Home Management : 23-37 mins  Norman Herrlich, OTR/L [phone] 09/16/2016, 12:03 PM

## 2016-09-16 NOTE — Progress Notes (Signed)
Orthopedic Tech Progress Note Patient Details:  Damon Snyder 01-13-1928 [id number]  Patient ID: Damon Gillis., male   DOB: 1928-03-29, 82 y.o.   MRN: [id number] Applied cpm 0-60  Karolee Stamps 09/16/2016, 6:41 AM

## 2016-09-16 NOTE — Progress Notes (Signed)
   Subjective: 2 Days Post-Op Procedure(s) (LRB): LEFT TOTAL KNEE ARTHROPLASTY (Left) Patient reports pain as mild.    Objective: Vital signs in last 24 hours: Temp:  [97.1 F (36.2 C)-97.5 F (36.4 C)] 97.4 F (36.3 C) (12/23 0433) Pulse Rate:  [63-87] 87 (12/23 0433) Resp:  [20] 20 (12/22 1530) BP: (139-184)/(56-70) 139/56 (12/23 0927) SpO2:  [98 %-100 %] 100 % (12/23 0433)  Intake/Output from previous day: 12/22 0701 - 12/23 0700 In: 210 [P.O.:210] Out: -  Intake/Output this shift: No intake/output data recorded.   Recent Labs  09/15/16 0536  HGB 10.6*    Recent Labs  09/15/16 0536  WBC 10.3  RBC 3.94*  HCT 34.7*  PLT 195    Recent Labs  09/15/16 0536  NA 140  K 4.6  CL 107  CO2 26  BUN 19  CREATININE 1.46*  GLUCOSE 159*  CALCIUM 8.7*   No results for input(s): LABPT, INR in the last 72 hours.  Neurologically intact No results found.  Assessment/Plan: 2 Days Post-Op Procedure(s) (LRB): LEFT TOTAL KNEE ARTHROPLASTY (Left) Discharge home with home health  Marybelle Killings 09/16/2016, 10:45 AM

## 2016-09-16 NOTE — Progress Notes (Signed)
Discharge home. Home discharge instruction given, no questions verbalized. 

## 2016-09-17 DIAGNOSIS — D649 Anemia, unspecified: Secondary | ICD-10-CM | POA: Diagnosis not present

## 2016-09-17 DIAGNOSIS — Z7982 Long term (current) use of aspirin: Secondary | ICD-10-CM | POA: Diagnosis not present

## 2016-09-17 DIAGNOSIS — M109 Gout, unspecified: Secondary | ICD-10-CM | POA: Diagnosis not present

## 2016-09-17 DIAGNOSIS — Z96652 Presence of left artificial knee joint: Secondary | ICD-10-CM | POA: Diagnosis not present

## 2016-09-17 DIAGNOSIS — Z471 Aftercare following joint replacement surgery: Secondary | ICD-10-CM | POA: Diagnosis not present

## 2016-09-17 DIAGNOSIS — Z8546 Personal history of malignant neoplasm of prostate: Secondary | ICD-10-CM | POA: Diagnosis not present

## 2016-09-17 DIAGNOSIS — Z85038 Personal history of other malignant neoplasm of large intestine: Secondary | ICD-10-CM | POA: Diagnosis not present

## 2016-09-17 DIAGNOSIS — Z794 Long term (current) use of insulin: Secondary | ICD-10-CM | POA: Diagnosis not present

## 2016-09-17 DIAGNOSIS — I10 Essential (primary) hypertension: Secondary | ICD-10-CM | POA: Diagnosis not present

## 2016-09-17 DIAGNOSIS — E119 Type 2 diabetes mellitus without complications: Secondary | ICD-10-CM | POA: Diagnosis not present

## 2016-09-19 ENCOUNTER — Encounter (HOSPITAL_COMMUNITY): Payer: Self-pay | Admitting: Orthopaedic Surgery

## 2016-09-19 DIAGNOSIS — I10 Essential (primary) hypertension: Secondary | ICD-10-CM | POA: Diagnosis not present

## 2016-09-19 DIAGNOSIS — M109 Gout, unspecified: Secondary | ICD-10-CM | POA: Diagnosis not present

## 2016-09-19 DIAGNOSIS — E119 Type 2 diabetes mellitus without complications: Secondary | ICD-10-CM | POA: Diagnosis not present

## 2016-09-19 DIAGNOSIS — D649 Anemia, unspecified: Secondary | ICD-10-CM | POA: Diagnosis not present

## 2016-09-19 DIAGNOSIS — Z96652 Presence of left artificial knee joint: Secondary | ICD-10-CM | POA: Diagnosis not present

## 2016-09-19 DIAGNOSIS — Z471 Aftercare following joint replacement surgery: Secondary | ICD-10-CM | POA: Diagnosis not present

## 2016-09-20 ENCOUNTER — Telehealth (INDEPENDENT_AMBULATORY_CARE_PROVIDER_SITE_OTHER): Payer: Self-pay | Admitting: Orthopaedic Surgery

## 2016-09-20 DIAGNOSIS — Z471 Aftercare following joint replacement surgery: Secondary | ICD-10-CM | POA: Diagnosis not present

## 2016-09-20 DIAGNOSIS — I10 Essential (primary) hypertension: Secondary | ICD-10-CM | POA: Diagnosis not present

## 2016-09-20 DIAGNOSIS — Z96652 Presence of left artificial knee joint: Secondary | ICD-10-CM | POA: Diagnosis not present

## 2016-09-20 DIAGNOSIS — E119 Type 2 diabetes mellitus without complications: Secondary | ICD-10-CM | POA: Diagnosis not present

## 2016-09-20 DIAGNOSIS — M109 Gout, unspecified: Secondary | ICD-10-CM | POA: Diagnosis not present

## 2016-09-20 DIAGNOSIS — D649 Anemia, unspecified: Secondary | ICD-10-CM | POA: Diagnosis not present

## 2016-09-20 NOTE — Telephone Encounter (Signed)
[place] no answer, LMOM

## 2016-09-20 NOTE — Telephone Encounter (Signed)
Mikle Bosworth PT w/ [facility] called and states pt fell yesterday onto the couch and states she thinks the cause of him falling was bc pt took too many meds. Took nausea medicine even though he didn't need it and leg gave out and fell. Wife would like patient to go to a Rehab center. Therapist thinks he doesn't need to he just needs to manage/ take meds the correct way.   Therapist would like verbal orders to see patient 2 more visits.   Please advise. Thanks.

## 2016-09-20 NOTE — Telephone Encounter (Signed)
Pt wife clara stated pt needs to be in rehab not in home care. Pt not doing good at all. She requested a call back to discuss asap.  [name]- [phone]

## 2016-09-21 DIAGNOSIS — I10 Essential (primary) hypertension: Secondary | ICD-10-CM | POA: Diagnosis not present

## 2016-09-21 DIAGNOSIS — Z471 Aftercare following joint replacement surgery: Secondary | ICD-10-CM | POA: Diagnosis not present

## 2016-09-21 DIAGNOSIS — M109 Gout, unspecified: Secondary | ICD-10-CM | POA: Diagnosis not present

## 2016-09-21 DIAGNOSIS — E119 Type 2 diabetes mellitus without complications: Secondary | ICD-10-CM | POA: Diagnosis not present

## 2016-09-21 DIAGNOSIS — Z96652 Presence of left artificial knee joint: Secondary | ICD-10-CM | POA: Diagnosis not present

## 2016-09-21 DIAGNOSIS — D649 Anemia, unspecified: Secondary | ICD-10-CM | POA: Diagnosis not present

## 2016-09-21 NOTE — Telephone Encounter (Signed)
yes

## 2016-09-21 NOTE — Discharge Summary (Signed)
Physician Discharge Summary      Patient ID: Damon Snyder. MRN: [id number] DOB/AGE: 05-26-1928 82 y.o.  Admit date: 09/14/2016 Discharge date: 09/21/2016  Admission Diagnoses:  <principal problem not specified>  Discharge Diagnoses:  Active Problems:   Primary osteoarthritis of left knee   Total knee replacement status   Past Medical History:  Diagnosis Date  . Anemia    h/o   . Arthritis    "left knee; shoulders" (09/14/2016)  . Colon cancer ([place])     tx : chemo)   . Difficulty in walking(719.7)   . Dyspnea    relative to pain in her leg  . Foot ulcer ([place])   . Gout   . High cholesterol   . History of blood transfusion    "that's when they found out about the colon cancer"  . Hypertension   . Lower GI bleed   . Prostate cancer [facility])     treated with radiation   . Type II diabetes mellitus ([place])     Surgeries: Procedure(s): LEFT TOTAL KNEE ARTHROPLASTY on 09/14/2016   Consultants (if any):   Discharged Condition: Improved  Hospital Course: Davyon Brosz. is an 82 y.o. male who was admitted 09/14/2016 with a diagnosis of <principal problem not specified> and went to the operating room on 09/14/2016 and underwent the above named procedures.    He was given perioperative antibiotics:  Anti-infectives    Start     Dose/Rate Route Frequency Ordered Stop   09/15/16 0600  ceFAZolin (ANCEF) IVPB 2g/100 mL premix     2 g 200 mL/hr over 30 Minutes Intravenous On call to O.R. 09/14/16 1244 09/15/16 0430   09/14/16 2200  ceFAZolin (ANCEF) IVPB 2g/100 mL premix     2 g 200 mL/hr over 30 Minutes Intravenous Every 6 hours 09/14/16 2004 09/15/16 0346   09/14/16 1247  ceFAZolin (ANCEF) 2-4 GM/100ML-% IVPB    Comments:  Schonewitz, Leigh   : cabinet override      09/14/16 1247 09/14/16 1541    .  He was given sequential compression devices, early ambulation, and aspirin for DVT prophylaxis.  He benefited maximally from the hospital stay and there were no  complications.    Recent vital signs:  Vitals:   09/16/16 0434 09/16/16 0927  BP: (!) 173/66 (!) 139/56  Pulse:    Resp:    Temp:      Recent laboratory studies:  Lab Results  Component Value Date   HGB 10.6 (L) 09/15/2016   HGB 12.0 (L) 09/07/2016   HGB 12.9 (L) 09/22/2013   Lab Results  Component Value Date   WBC 10.3 09/15/2016   PLT 195 09/15/2016   Lab Results  Component Value Date   INR 1.05 09/07/2016   Lab Results  Component Value Date   NA 140 09/15/2016   K 4.6 09/15/2016   CL 107 09/15/2016   CO2 26 09/15/2016   BUN 19 09/15/2016   CREATININE 1.46 (H) 09/15/2016   GLUCOSE 159 (H) 09/15/2016    Discharge Medications:   Allergies as of 09/16/2016   No Known Allergies     Medication List    STOP taking these medications   aspirin 81 MG tablet Replaced by:  aspirin EC 325 MG tablet     TAKE these medications   allopurinol 100 MG tablet Commonly known as:  ZYLOPRIM Take 200 mg by mouth daily as needed.   amLODipine 10 MG tablet Commonly known as:  NORVASC  Take 10 mg by mouth daily.   aspirin EC 325 MG tablet Take 1 tablet (325 mg total) by mouth 2 (two) times daily. Replaces:  aspirin 81 MG tablet   atorvastatin 20 MG tablet Commonly known as:  LIPITOR Take 20 mg by mouth daily.   benazepril-hydrochlorthiazide 20-12.5 MG tablet Commonly known as:  LOTENSIN HCT Take 1 tablet by mouth daily.   BYSTOLIC 20 MG Tabs Generic drug:  Nebivolol HCl Take 20 mg by mouth daily.   colchicine 0.6 MG tablet Take 0.6 mg by mouth every 4 (four) hours as needed (gout).   diphenhydrAMINE 25 mg capsule Commonly known as:  BENADRYL Take 25 mg by mouth every 6 (six) hours as needed for itching or allergies.   LANTUS SOLOSTAR 100 UNIT/ML Solostar Pen Generic drug:  Insulin Glargine Inject 60 Units into the skin at bedtime.   methocarbamol 750 MG tablet Commonly known as:  ROBAXIN Take 1 tablet (750 mg total) by mouth 2 (two) times daily as needed  for muscle spasms.   NOVOFINE 32G X 6 MM Misc Generic drug:  Insulin Pen Needle   ondansetron 4 MG tablet Commonly known as:  ZOFRAN Take 1-2 tablets (4-8 mg total) by mouth every 8 (eight) hours as needed for nausea or vomiting.   oxyCODONE 5 MG immediate release tablet Commonly known as:  Oxy IR/ROXICODONE Take 1-3 tablets (5-15 mg total) by mouth every 4 (four) hours as needed.   oxyCODONE 10 mg 12 hr tablet Commonly known as:  OXYCONTIN Take 1 tablet (10 mg total) by mouth every 12 (twelve) hours.   promethazine 25 MG tablet Commonly known as:  PHENERGAN Take 1 tablet (25 mg total) by mouth every 6 (six) hours as needed for nausea.   senna-docusate 8.6-50 MG tablet Commonly known as:  SENOKOT S Take 1 tablet by mouth at bedtime as needed.   sodium chloride 0.65 % Soln nasal spray Commonly known as:  OCEAN Place 1 spray into both nostrils as needed for congestion.       Diagnostic Studies: Dg Knee Left Port  Result Date: 09/14/2016 CLINICAL DATA:  Total knee replacement EXAM: PORTABLE LEFT KNEE - 1-2 VIEW COMPARISON:  None. FINDINGS: Patient is status post left knee replacement with normal alignment. Small amount of soft tissue gas distal anterior thigh, felt postoperative. Dense vascular calcifications present posteriorly. IMPRESSION: Status post left knee replacement with expected postsurgical changes Electronically Signed   By: Donavan Foil M.D.   On: 09/14/2016 19:18    Disposition: 01-Home or Self Care    Follow-up Information    Eduard Roux, MD Follow up in 2 week(s).   Specialty:  Orthopedic Surgery Why:  For suture removal, For wound re-check Contact information: [place] [address] [phone]        [facility] Follow up.   Why:  [facility] will provide home health physical therapy Contact information: [address] [place] [address] [phone]             Signed: Eduard Roux 09/21/2016, 7:53 PM

## 2016-09-22 DIAGNOSIS — E119 Type 2 diabetes mellitus without complications: Secondary | ICD-10-CM | POA: Diagnosis not present

## 2016-09-22 DIAGNOSIS — D649 Anemia, unspecified: Secondary | ICD-10-CM | POA: Diagnosis not present

## 2016-09-22 DIAGNOSIS — Z471 Aftercare following joint replacement surgery: Secondary | ICD-10-CM | POA: Diagnosis not present

## 2016-09-22 DIAGNOSIS — M109 Gout, unspecified: Secondary | ICD-10-CM | POA: Diagnosis not present

## 2016-09-22 DIAGNOSIS — I10 Essential (primary) hypertension: Secondary | ICD-10-CM | POA: Diagnosis not present

## 2016-09-22 DIAGNOSIS — Z96652 Presence of left artificial knee joint: Secondary | ICD-10-CM | POA: Diagnosis not present

## 2016-09-26 DIAGNOSIS — Z471 Aftercare following joint replacement surgery: Secondary | ICD-10-CM | POA: Diagnosis not present

## 2016-09-26 DIAGNOSIS — E119 Type 2 diabetes mellitus without complications: Secondary | ICD-10-CM | POA: Diagnosis not present

## 2016-09-26 DIAGNOSIS — Z96652 Presence of left artificial knee joint: Secondary | ICD-10-CM | POA: Diagnosis not present

## 2016-09-26 DIAGNOSIS — I10 Essential (primary) hypertension: Secondary | ICD-10-CM | POA: Diagnosis not present

## 2016-09-26 DIAGNOSIS — M109 Gout, unspecified: Secondary | ICD-10-CM | POA: Diagnosis not present

## 2016-09-26 DIAGNOSIS — D649 Anemia, unspecified: Secondary | ICD-10-CM | POA: Diagnosis not present

## 2016-09-28 ENCOUNTER — Telehealth (INDEPENDENT_AMBULATORY_CARE_PROVIDER_SITE_OTHER): Payer: Self-pay | Admitting: Orthopaedic Surgery

## 2016-09-28 DIAGNOSIS — M109 Gout, unspecified: Secondary | ICD-10-CM | POA: Diagnosis not present

## 2016-09-28 DIAGNOSIS — D649 Anemia, unspecified: Secondary | ICD-10-CM | POA: Diagnosis not present

## 2016-09-28 DIAGNOSIS — E119 Type 2 diabetes mellitus without complications: Secondary | ICD-10-CM | POA: Diagnosis not present

## 2016-09-28 DIAGNOSIS — I10 Essential (primary) hypertension: Secondary | ICD-10-CM | POA: Diagnosis not present

## 2016-09-28 DIAGNOSIS — Z96652 Presence of left artificial knee joint: Secondary | ICD-10-CM | POA: Diagnosis not present

## 2016-09-28 DIAGNOSIS — Z471 Aftercare following joint replacement surgery: Secondary | ICD-10-CM | POA: Diagnosis not present

## 2016-09-28 NOTE — Telephone Encounter (Signed)
Rachel Moulds,  Phys therapist, wants to discuss pt swelling in his leg today with you. Requested call at [phone]

## 2016-09-28 NOTE — Telephone Encounter (Signed)
Called and no answer. LMOM to return my call. I am in the triage desk this afternoon. Thanks.

## 2016-09-29 ENCOUNTER — Encounter (INDEPENDENT_AMBULATORY_CARE_PROVIDER_SITE_OTHER): Payer: Self-pay | Admitting: Orthopaedic Surgery

## 2016-09-29 ENCOUNTER — Ambulatory Visit (INDEPENDENT_AMBULATORY_CARE_PROVIDER_SITE_OTHER): Payer: Medicare Other | Admitting: Orthopaedic Surgery

## 2016-09-29 DIAGNOSIS — M1712 Unilateral primary osteoarthritis, left knee: Secondary | ICD-10-CM

## 2016-09-29 NOTE — Telephone Encounter (Signed)
Called therapist back since she did not call me back yesterday. Therapist states patient has not been compliant. She states he has not been using compression socks as much and has not been doing as much either. She thinks it  would be better if he goes out for PT. He does well when someone helps him but it is a struggle when he does the exercises on his own. Patient has a scheduled appt today. She just wanted to give [place] an [place]

## 2016-09-29 NOTE — Progress Notes (Signed)
Patient is 2 weeks status post left total knee replacement. He is doing well. He does have some mild pain. He is not been wearing his compression hose. Physical exam shows well-healed incision. No signs of infection. He does have significant swelling of the operative extremity. His left calf is nontender. At this point I want him to wear his TED hose more regularly. Continue with physical therapy. His range of motion is progressing appropriately. I'll see him back in 4 weeks with 2 view x-rays of the left knee

## 2016-09-29 NOTE — Telephone Encounter (Signed)
Ok i'm seeing him today anyways

## 2016-10-03 DIAGNOSIS — M109 Gout, unspecified: Secondary | ICD-10-CM | POA: Diagnosis not present

## 2016-10-03 DIAGNOSIS — Z471 Aftercare following joint replacement surgery: Secondary | ICD-10-CM | POA: Diagnosis not present

## 2016-10-03 DIAGNOSIS — Z96652 Presence of left artificial knee joint: Secondary | ICD-10-CM | POA: Diagnosis not present

## 2016-10-03 DIAGNOSIS — D649 Anemia, unspecified: Secondary | ICD-10-CM | POA: Diagnosis not present

## 2016-10-03 DIAGNOSIS — I10 Essential (primary) hypertension: Secondary | ICD-10-CM | POA: Diagnosis not present

## 2016-10-03 DIAGNOSIS — E119 Type 2 diabetes mellitus without complications: Secondary | ICD-10-CM | POA: Diagnosis not present

## 2016-10-05 DIAGNOSIS — E119 Type 2 diabetes mellitus without complications: Secondary | ICD-10-CM | POA: Diagnosis not present

## 2016-10-05 DIAGNOSIS — Z471 Aftercare following joint replacement surgery: Secondary | ICD-10-CM | POA: Diagnosis not present

## 2016-10-05 DIAGNOSIS — M109 Gout, unspecified: Secondary | ICD-10-CM | POA: Diagnosis not present

## 2016-10-05 DIAGNOSIS — D649 Anemia, unspecified: Secondary | ICD-10-CM | POA: Diagnosis not present

## 2016-10-05 DIAGNOSIS — I10 Essential (primary) hypertension: Secondary | ICD-10-CM | POA: Diagnosis not present

## 2016-10-05 DIAGNOSIS — Z96652 Presence of left artificial knee joint: Secondary | ICD-10-CM | POA: Diagnosis not present

## 2016-10-06 ENCOUNTER — Telehealth (INDEPENDENT_AMBULATORY_CARE_PROVIDER_SITE_OTHER): Payer: Self-pay | Admitting: Orthopaedic Surgery

## 2016-10-06 NOTE — Telephone Encounter (Signed)
yes

## 2016-10-06 NOTE — Telephone Encounter (Signed)
Cathy (nurse) with [employer] called needing an order to admit the patient to [facility] Patient is not doing well at home even with [facility]) patient is still not doing well. The number to contact T[place] is [phone]

## 2016-10-06 NOTE — Telephone Encounter (Signed)
Please advise 

## 2016-10-09 DIAGNOSIS — D500 Iron deficiency anemia secondary to blood loss (chronic): Secondary | ICD-10-CM | POA: Diagnosis not present

## 2016-10-09 DIAGNOSIS — Z471 Aftercare following joint replacement surgery: Secondary | ICD-10-CM | POA: Diagnosis not present

## 2016-10-09 DIAGNOSIS — I10 Essential (primary) hypertension: Secondary | ICD-10-CM | POA: Diagnosis not present

## 2016-10-09 DIAGNOSIS — Z7982 Long term (current) use of aspirin: Secondary | ICD-10-CM | POA: Diagnosis not present

## 2016-10-09 DIAGNOSIS — Z96652 Presence of left artificial knee joint: Secondary | ICD-10-CM | POA: Diagnosis not present

## 2016-10-09 DIAGNOSIS — Z9889 Other specified postprocedural states: Secondary | ICD-10-CM | POA: Diagnosis not present

## 2016-10-09 DIAGNOSIS — R2689 Other abnormalities of gait and mobility: Secondary | ICD-10-CM | POA: Diagnosis not present

## 2016-10-09 DIAGNOSIS — M109 Gout, unspecified: Secondary | ICD-10-CM | POA: Diagnosis not present

## 2016-10-09 DIAGNOSIS — E785 Hyperlipidemia, unspecified: Secondary | ICD-10-CM | POA: Diagnosis not present

## 2016-10-09 DIAGNOSIS — Z794 Long term (current) use of insulin: Secondary | ICD-10-CM | POA: Diagnosis not present

## 2016-10-09 DIAGNOSIS — E119 Type 2 diabetes mellitus without complications: Secondary | ICD-10-CM | POA: Diagnosis not present

## 2016-10-09 DIAGNOSIS — D649 Anemia, unspecified: Secondary | ICD-10-CM | POA: Diagnosis not present

## 2016-10-09 DIAGNOSIS — M6281 Muscle weakness (generalized): Secondary | ICD-10-CM | POA: Diagnosis not present

## 2016-10-09 NOTE — Telephone Encounter (Signed)
Pt wife called wanting a call back about this. CB: [phone]

## 2016-10-09 NOTE — Telephone Encounter (Signed)
Called wife back and I advised that Cathy(Nurse) did no answer, went straight to [employer]. I asked wife if they needed the order to be faxed or if they can take a verbal order. She said she will find out and give [place] a call. She will call me back. I am in triage this morning.

## 2016-10-09 NOTE — Telephone Encounter (Signed)
FAXED ORDER TO Damon Snyder

## 2016-10-10 ENCOUNTER — Telehealth (INDEPENDENT_AMBULATORY_CARE_PROVIDER_SITE_OTHER): Payer: Self-pay | Admitting: *Deleted

## 2016-10-10 NOTE — Telephone Encounter (Signed)
Sage from [facility] is calling for orthopedic notes, pt [facility] [phone]

## 2016-10-10 NOTE — Telephone Encounter (Signed)
Faxed notes to [phone]

## 2016-10-13 DIAGNOSIS — Z9889 Other specified postprocedural states: Secondary | ICD-10-CM | POA: Diagnosis not present

## 2016-10-13 DIAGNOSIS — E119 Type 2 diabetes mellitus without complications: Secondary | ICD-10-CM | POA: Diagnosis not present

## 2016-10-13 DIAGNOSIS — D500 Iron deficiency anemia secondary to blood loss (chronic): Secondary | ICD-10-CM | POA: Diagnosis not present

## 2016-10-13 DIAGNOSIS — I10 Essential (primary) hypertension: Secondary | ICD-10-CM | POA: Diagnosis not present

## 2016-10-24 DIAGNOSIS — I10 Essential (primary) hypertension: Secondary | ICD-10-CM | POA: Diagnosis not present

## 2016-10-24 DIAGNOSIS — E119 Type 2 diabetes mellitus without complications: Secondary | ICD-10-CM | POA: Diagnosis not present

## 2016-10-24 DIAGNOSIS — Z471 Aftercare following joint replacement surgery: Secondary | ICD-10-CM | POA: Diagnosis not present

## 2016-10-24 DIAGNOSIS — M109 Gout, unspecified: Secondary | ICD-10-CM | POA: Diagnosis not present

## 2016-10-24 DIAGNOSIS — Z96652 Presence of left artificial knee joint: Secondary | ICD-10-CM | POA: Diagnosis not present

## 2016-10-24 DIAGNOSIS — D649 Anemia, unspecified: Secondary | ICD-10-CM | POA: Diagnosis not present

## 2016-10-27 DIAGNOSIS — M109 Gout, unspecified: Secondary | ICD-10-CM | POA: Diagnosis not present

## 2016-10-27 DIAGNOSIS — Z471 Aftercare following joint replacement surgery: Secondary | ICD-10-CM | POA: Diagnosis not present

## 2016-10-27 DIAGNOSIS — E119 Type 2 diabetes mellitus without complications: Secondary | ICD-10-CM | POA: Diagnosis not present

## 2016-10-27 DIAGNOSIS — I10 Essential (primary) hypertension: Secondary | ICD-10-CM | POA: Diagnosis not present

## 2016-10-27 DIAGNOSIS — D649 Anemia, unspecified: Secondary | ICD-10-CM | POA: Diagnosis not present

## 2016-10-27 DIAGNOSIS — Z96652 Presence of left artificial knee joint: Secondary | ICD-10-CM | POA: Diagnosis not present

## 2016-10-28 DIAGNOSIS — I10 Essential (primary) hypertension: Secondary | ICD-10-CM | POA: Diagnosis not present

## 2016-10-28 DIAGNOSIS — Z471 Aftercare following joint replacement surgery: Secondary | ICD-10-CM | POA: Diagnosis not present

## 2016-10-28 DIAGNOSIS — D649 Anemia, unspecified: Secondary | ICD-10-CM | POA: Diagnosis not present

## 2016-10-28 DIAGNOSIS — M109 Gout, unspecified: Secondary | ICD-10-CM | POA: Diagnosis not present

## 2016-10-28 DIAGNOSIS — E119 Type 2 diabetes mellitus without complications: Secondary | ICD-10-CM | POA: Diagnosis not present

## 2016-10-28 DIAGNOSIS — Z96652 Presence of left artificial knee joint: Secondary | ICD-10-CM | POA: Diagnosis not present

## 2016-10-30 DIAGNOSIS — M109 Gout, unspecified: Secondary | ICD-10-CM | POA: Diagnosis not present

## 2016-10-30 DIAGNOSIS — D649 Anemia, unspecified: Secondary | ICD-10-CM | POA: Diagnosis not present

## 2016-10-30 DIAGNOSIS — Z96652 Presence of left artificial knee joint: Secondary | ICD-10-CM | POA: Diagnosis not present

## 2016-10-30 DIAGNOSIS — I10 Essential (primary) hypertension: Secondary | ICD-10-CM | POA: Diagnosis not present

## 2016-10-30 DIAGNOSIS — Z471 Aftercare following joint replacement surgery: Secondary | ICD-10-CM | POA: Diagnosis not present

## 2016-10-30 DIAGNOSIS — E119 Type 2 diabetes mellitus without complications: Secondary | ICD-10-CM | POA: Diagnosis not present

## 2016-10-31 ENCOUNTER — Ambulatory Visit (INDEPENDENT_AMBULATORY_CARE_PROVIDER_SITE_OTHER): Payer: Medicare Other

## 2016-10-31 ENCOUNTER — Encounter (INDEPENDENT_AMBULATORY_CARE_PROVIDER_SITE_OTHER): Payer: Self-pay | Admitting: Orthopaedic Surgery

## 2016-10-31 ENCOUNTER — Ambulatory Visit (INDEPENDENT_AMBULATORY_CARE_PROVIDER_SITE_OTHER): Payer: Medicare Other | Admitting: Orthopaedic Surgery

## 2016-10-31 DIAGNOSIS — M1712 Unilateral primary osteoarthritis, left knee: Secondary | ICD-10-CM | POA: Diagnosis not present

## 2016-10-31 DIAGNOSIS — D649 Anemia, unspecified: Secondary | ICD-10-CM | POA: Diagnosis not present

## 2016-10-31 DIAGNOSIS — I10 Essential (primary) hypertension: Secondary | ICD-10-CM | POA: Diagnosis not present

## 2016-10-31 DIAGNOSIS — Z471 Aftercare following joint replacement surgery: Secondary | ICD-10-CM | POA: Diagnosis not present

## 2016-10-31 DIAGNOSIS — Z96652 Presence of left artificial knee joint: Secondary | ICD-10-CM | POA: Diagnosis not present

## 2016-10-31 DIAGNOSIS — M109 Gout, unspecified: Secondary | ICD-10-CM | POA: Diagnosis not present

## 2016-10-31 DIAGNOSIS — E119 Type 2 diabetes mellitus without complications: Secondary | ICD-10-CM | POA: Diagnosis not present

## 2016-10-31 NOTE — Progress Notes (Signed)
Patient is 7 weeks s/p left TKA.  Doing well with HHPT.  Pain well controlled.  Scar is well healed with slight warmth no signs of infection.  ROM 5-95 degrees.  xrays are stable.  Referral to outpatient PT to PT and hand specialist.  F/u 2 months.  No xrays needed.  May drive and d/c aspirin

## 2016-11-01 DIAGNOSIS — D649 Anemia, unspecified: Secondary | ICD-10-CM | POA: Diagnosis not present

## 2016-11-01 DIAGNOSIS — Z471 Aftercare following joint replacement surgery: Secondary | ICD-10-CM | POA: Diagnosis not present

## 2016-11-01 DIAGNOSIS — I10 Essential (primary) hypertension: Secondary | ICD-10-CM | POA: Diagnosis not present

## 2016-11-01 DIAGNOSIS — M109 Gout, unspecified: Secondary | ICD-10-CM | POA: Diagnosis not present

## 2016-11-01 DIAGNOSIS — Z96652 Presence of left artificial knee joint: Secondary | ICD-10-CM | POA: Diagnosis not present

## 2016-11-01 DIAGNOSIS — E119 Type 2 diabetes mellitus without complications: Secondary | ICD-10-CM | POA: Diagnosis not present

## 2016-11-02 DIAGNOSIS — Z471 Aftercare following joint replacement surgery: Secondary | ICD-10-CM | POA: Diagnosis not present

## 2016-11-02 DIAGNOSIS — E119 Type 2 diabetes mellitus without complications: Secondary | ICD-10-CM | POA: Diagnosis not present

## 2016-11-02 DIAGNOSIS — I10 Essential (primary) hypertension: Secondary | ICD-10-CM | POA: Diagnosis not present

## 2016-11-02 DIAGNOSIS — D649 Anemia, unspecified: Secondary | ICD-10-CM | POA: Diagnosis not present

## 2016-11-02 DIAGNOSIS — M109 Gout, unspecified: Secondary | ICD-10-CM | POA: Diagnosis not present

## 2016-11-02 DIAGNOSIS — Z96652 Presence of left artificial knee joint: Secondary | ICD-10-CM | POA: Diagnosis not present

## 2016-11-06 DIAGNOSIS — E119 Type 2 diabetes mellitus without complications: Secondary | ICD-10-CM | POA: Diagnosis not present

## 2016-11-06 DIAGNOSIS — Z471 Aftercare following joint replacement surgery: Secondary | ICD-10-CM | POA: Diagnosis not present

## 2016-11-06 DIAGNOSIS — Z96652 Presence of left artificial knee joint: Secondary | ICD-10-CM | POA: Diagnosis not present

## 2016-11-06 DIAGNOSIS — M109 Gout, unspecified: Secondary | ICD-10-CM | POA: Diagnosis not present

## 2016-11-06 DIAGNOSIS — D649 Anemia, unspecified: Secondary | ICD-10-CM | POA: Diagnosis not present

## 2016-11-06 DIAGNOSIS — I10 Essential (primary) hypertension: Secondary | ICD-10-CM | POA: Diagnosis not present

## 2016-11-07 DIAGNOSIS — I10 Essential (primary) hypertension: Secondary | ICD-10-CM | POA: Diagnosis not present

## 2016-11-07 DIAGNOSIS — Z471 Aftercare following joint replacement surgery: Secondary | ICD-10-CM | POA: Diagnosis not present

## 2016-11-07 DIAGNOSIS — Z96652 Presence of left artificial knee joint: Secondary | ICD-10-CM | POA: Diagnosis not present

## 2016-11-07 DIAGNOSIS — D649 Anemia, unspecified: Secondary | ICD-10-CM | POA: Diagnosis not present

## 2016-11-07 DIAGNOSIS — M109 Gout, unspecified: Secondary | ICD-10-CM | POA: Diagnosis not present

## 2016-11-07 DIAGNOSIS — E119 Type 2 diabetes mellitus without complications: Secondary | ICD-10-CM | POA: Diagnosis not present

## 2016-11-08 DIAGNOSIS — D649 Anemia, unspecified: Secondary | ICD-10-CM | POA: Diagnosis not present

## 2016-11-08 DIAGNOSIS — I10 Essential (primary) hypertension: Secondary | ICD-10-CM | POA: Diagnosis not present

## 2016-11-08 DIAGNOSIS — M109 Gout, unspecified: Secondary | ICD-10-CM | POA: Diagnosis not present

## 2016-11-08 DIAGNOSIS — Z471 Aftercare following joint replacement surgery: Secondary | ICD-10-CM | POA: Diagnosis not present

## 2016-11-08 DIAGNOSIS — E119 Type 2 diabetes mellitus without complications: Secondary | ICD-10-CM | POA: Diagnosis not present

## 2016-11-08 DIAGNOSIS — Z96652 Presence of left artificial knee joint: Secondary | ICD-10-CM | POA: Diagnosis not present

## 2016-11-09 DIAGNOSIS — D649 Anemia, unspecified: Secondary | ICD-10-CM | POA: Diagnosis not present

## 2016-11-09 DIAGNOSIS — M109 Gout, unspecified: Secondary | ICD-10-CM | POA: Diagnosis not present

## 2016-11-09 DIAGNOSIS — Z471 Aftercare following joint replacement surgery: Secondary | ICD-10-CM | POA: Diagnosis not present

## 2016-11-09 DIAGNOSIS — I10 Essential (primary) hypertension: Secondary | ICD-10-CM | POA: Diagnosis not present

## 2016-11-09 DIAGNOSIS — E119 Type 2 diabetes mellitus without complications: Secondary | ICD-10-CM | POA: Diagnosis not present

## 2016-11-09 DIAGNOSIS — Z96652 Presence of left artificial knee joint: Secondary | ICD-10-CM | POA: Diagnosis not present

## 2016-11-14 DIAGNOSIS — E1122 Type 2 diabetes mellitus with diabetic chronic kidney disease: Secondary | ICD-10-CM | POA: Diagnosis not present

## 2016-11-14 DIAGNOSIS — N183 Chronic kidney disease, stage 3 (moderate): Secondary | ICD-10-CM | POA: Diagnosis not present

## 2016-11-14 DIAGNOSIS — E782 Mixed hyperlipidemia: Secondary | ICD-10-CM | POA: Diagnosis not present

## 2016-11-14 DIAGNOSIS — I739 Peripheral vascular disease, unspecified: Secondary | ICD-10-CM | POA: Diagnosis not present

## 2016-11-14 DIAGNOSIS — Z96659 Presence of unspecified artificial knee joint: Secondary | ICD-10-CM | POA: Diagnosis not present

## 2016-11-14 DIAGNOSIS — E669 Obesity, unspecified: Secondary | ICD-10-CM | POA: Diagnosis not present

## 2016-11-14 DIAGNOSIS — I130 Hypertensive heart and chronic kidney disease with heart failure and stage 1 through stage 4 chronic kidney disease, or unspecified chronic kidney disease: Secondary | ICD-10-CM | POA: Diagnosis not present

## 2016-11-14 DIAGNOSIS — I5030 Unspecified diastolic (congestive) heart failure: Secondary | ICD-10-CM | POA: Diagnosis not present

## 2016-11-14 DIAGNOSIS — C61 Malignant neoplasm of prostate: Secondary | ICD-10-CM | POA: Diagnosis not present

## 2016-11-14 DIAGNOSIS — R05 Cough: Secondary | ICD-10-CM | POA: Diagnosis not present

## 2016-11-14 DIAGNOSIS — E1151 Type 2 diabetes mellitus with diabetic peripheral angiopathy without gangrene: Secondary | ICD-10-CM | POA: Diagnosis not present

## 2016-11-15 DIAGNOSIS — E1151 Type 2 diabetes mellitus with diabetic peripheral angiopathy without gangrene: Secondary | ICD-10-CM | POA: Diagnosis not present

## 2016-11-16 ENCOUNTER — Ambulatory Visit: Payer: Medicare Other | Admitting: Podiatry

## 2016-11-23 DIAGNOSIS — N179 Acute kidney failure, unspecified: Secondary | ICD-10-CM | POA: Diagnosis not present

## 2016-11-23 DIAGNOSIS — N183 Chronic kidney disease, stage 3 (moderate): Secondary | ICD-10-CM | POA: Diagnosis not present

## 2016-12-07 ENCOUNTER — Encounter: Payer: Self-pay | Admitting: Podiatry

## 2016-12-07 ENCOUNTER — Ambulatory Visit (INDEPENDENT_AMBULATORY_CARE_PROVIDER_SITE_OTHER): Payer: Medicare Other | Admitting: Podiatry

## 2016-12-07 DIAGNOSIS — M79671 Pain in right foot: Secondary | ICD-10-CM | POA: Diagnosis not present

## 2016-12-07 DIAGNOSIS — I739 Peripheral vascular disease, unspecified: Secondary | ICD-10-CM

## 2016-12-07 DIAGNOSIS — M79672 Pain in left foot: Secondary | ICD-10-CM

## 2016-12-07 DIAGNOSIS — B351 Tinea unguium: Secondary | ICD-10-CM

## 2016-12-07 NOTE — Progress Notes (Signed)
Subjective: 83year old male presents for diabetic foot care for painful toe nails. Had left knee replacement surgery on 09/14/16 and been doing well with Rehab. Doing well with blood sugar.   Objective: Thick dystrophic nails both great toes.  Thick broad circular callus under 5th MPJ bilateral. Pedal pulses are not palpable on both feet. Severe digital contracture 2nd bilateral.  No edema or erythema noted.   Subjectitve: Onychomycosis bilateral.  Keratoma sub 5 bilateral PVD.  Painful feet.   Plan: Debrided all calluses and nails.  Return in 3 months or as needed

## 2016-12-07 NOTE — Patient Instructions (Signed)
Seen for hypertrophic nails. All nails and calluses debrided. Return in 3 months or as needed.  

## 2017-02-14 ENCOUNTER — Encounter: Payer: Self-pay | Admitting: Family

## 2017-02-23 ENCOUNTER — Ambulatory Visit (INDEPENDENT_AMBULATORY_CARE_PROVIDER_SITE_OTHER): Payer: Medicare Other | Admitting: Family

## 2017-02-23 ENCOUNTER — Encounter: Payer: Self-pay | Admitting: Family

## 2017-02-23 ENCOUNTER — Ambulatory Visit (HOSPITAL_COMMUNITY)
Admission: RE | Admit: 2017-02-23 | Discharge: 2017-02-23 | Disposition: A | Discharge status: Home / Self Care | Payer: Medicare Other | Source: Ambulatory Visit | Attending: Vascular Surgery | Admitting: Vascular Surgery

## 2017-02-23 VITALS — BP 175/77 | HR 60 | Resp 20 | Ht 76.0 in | Wt 250.4 lb

## 2017-02-23 DIAGNOSIS — I779 Disorder of arteries and arterioles, unspecified: Secondary | ICD-10-CM

## 2017-02-23 DIAGNOSIS — I70213 Atherosclerosis of native arteries of extremities with intermittent claudication, bilateral legs: Secondary | ICD-10-CM | POA: Diagnosis not present

## 2017-02-23 DIAGNOSIS — I739 Peripheral vascular disease, unspecified: Secondary | ICD-10-CM

## 2017-02-23 DIAGNOSIS — R9439 Abnormal result of other cardiovascular function study: Secondary | ICD-10-CM | POA: Diagnosis not present

## 2017-02-23 NOTE — Progress Notes (Signed)
[facility] [place]   CC: Follow up peripheral artery occlusive disease  History of Present Illness Damon Snyder. is a 83 y.o. male whom Dr. Bridgett Larsson has been monitoring for PAOD.   Pt was last evaluated by Dr. Bridgett Larsson and K. Trinh, PA-C, on 08-25-16 preoperatively to left knee replacement. At that time pt had lateral knee pain, left worse than right and bilateral moderate PAD The patient's ABIs had improved from his studies a year prior.  He had moderate PAD in L leg with strong monophasic flow in his pedal arteries. Dr. Bridgett Larsson indicated it was reasonable to proceed with L TKR from a vascular viewpoint. Pt was encouraged to continue to stay active using the stationary bike and to optimize his medical conditions. He was to follow-up in 6 months with ABIs. Pt had the left TKA on 09-14-16.   Pt states he had what sounds like an angioplasty and/or a stent placed in in left leg about 2013 in [place].  Pt denies non healing wounds. He denies rest pain. He uses a stationary bike, 30 minutes daily, 5 days/week.   Pt denies any history of stroke or TIA, denies any history of MI or cardiac procedures.   Pt Diabetic: Yes, last A1C result on file was 5.8 on 09-07-16 (review of records) Pt smoker: non-smoker  Pt meds include: Statin :Yes Betablocker: Yes ASA: Yes, 325 mg daily Other anticoagulants/antiplatelets: no  Past Medical History:  Diagnosis Date  . Anemia    h/o   . Arthritis    "left knee; shoulders" (09/14/2016)  . Colon cancer ([place])     tx : chemo)   . Difficulty in walking(719.7)   . Dyspnea    relative to pain in her leg  . Foot ulcer ([place])   . Gout   . High cholesterol   . History of blood transfusion    "that's when they found out about the colon cancer"  . Hypertension   . Lower GI bleed   . Prostate cancer [facility])     treated with radiation   . Type II diabetes mellitus ([place])     Social History Social History  Substance Use Topics  .  Smoking status: Never Smoker  . Smokeless tobacco: Never Used  . Alcohol use No    Family History Family History  Problem Relation Age of Onset  . Hypertension Mother   . Cancer Sister        Ovarian  . Hypertension Father   . Heart disease Brother        After age 86  . Kidney disease Sister     Past Surgical History:  Procedure Laterality Date  . CATARACT EXTRACTION W/ INTRAOCULAR LENS  IMPLANT, BILATERAL Bilateral   . COLECTOMY    . JOINT REPLACEMENT    . PROSTATE BIOPSY    . TOTAL KNEE ARTHROPLASTY Left 09/14/2016  . TOTAL KNEE ARTHROPLASTY Left 09/14/2016   Procedure: LEFT TOTAL KNEE ARTHROPLASTY;  Surgeon: Leandrew Koyanagi, MD;  Location: [place];  Service: Orthopedics;  Laterality: Left;    No Known Allergies  Current Outpatient Prescriptions  Medication Sig Dispense Refill  . allopurinol (ZYLOPRIM) 100 MG tablet Take 200 mg by mouth daily as needed.     Marland Kitchen amLODipine (NORVASC) 10 MG tablet Take 10 mg by mouth daily.    Marland Kitchen aspirin EC 325 MG tablet Take 1 tablet (325 mg total) by mouth 2 (two) times daily. 84 tablet 0  . atorvastatin (  LIPITOR) 20 MG tablet Take 20 mg by mouth daily.    . benazepril-hydrochlorthiazide (LOTENSIN HCT) 20-12.5 MG per tablet Take 1 tablet by mouth daily.     . colchicine 0.6 MG tablet Take 0.6 mg by mouth every 4 (four) hours as needed (gout).     Marland Kitchen diphenhydrAMINE (BENADRYL) 25 mg capsule Take 25 mg by mouth every 6 (six) hours as needed for itching or allergies.    Marland Kitchen LANTUS SOLOSTAR 100 UNIT/ML Solostar Pen Inject 60 Units into the skin at bedtime.     . Nebivolol HCl (BYSTOLIC) 20 MG TABS Take 20 mg by mouth daily.    Marland Kitchen NOVOFINE 32G X 6 MM MISC     . sodium chloride (OCEAN) 0.65 % SOLN nasal spray Place 1 spray into both nostrils as needed for congestion.     No current facility-administered medications for this visit.     ROS: See HPI for pertinent positives and negatives.   Physical Examination  Vitals:   02/23/17 1345 02/23/17 1351   BP: (!) 164/76 (!) 175/77  Pulse: 61 60  Resp: 20   SpO2: 98%   Weight: 250 lb 7 oz (113.6 kg)   Height: 6\' 4"  (1.93 m)    Body mass index is 30.48 kg/m.  General: A&O x 3, WD, WN obese elderly male  Pulmonary: Sym exp,respirations are non labored, good air movt, CTAB, no rales, rhonchi, & wheezing  Cardiac: Regular rhythm and rate, no appreciable murmur  Vascular: Vessel Right Left  Radial Palpable Palpable  Brachial Palpable Palpable  Carotid Palpable, without bruit Palpable, without bruit  Aorta Not palpable N/A  Femoral Palpable Palpable  Popliteal Not palpable Not palpable  PT Not Palpable Not Palpable  DP Not Palpable Not Palpable   Gastrointestinal: soft, NTND, -G/R, - HSM, - palpable masses, - CVAT B  Musculoskeletal: M/S 5/5 throughout , Extremities without ischemic changes   Neurologic: Pain and light touch intact in extremities , Motor exam as listed above      Non-Invasive Vascular Imaging:  08-25-16: Right: 0.81 with monophasic waveforms, TBI: 0.55 Left: 0.61 with monophasic waveforms, TBI: 0.27  Previously on 05/21/2015, right: 0.83, left: 0.41  ABI (Date: 02/23/2017)  R:   ABI: 0.89 (0.81, 08-25-16),   PT: mono  DP: mono  TBI:  0.33 (was 0.55 on 08-25-16)   L:   ABI: 0.60 (was 0.61),   PT: mono  DP: mono  TBI: 0.28 (was 0.27)    ASSESSMENT: Damon Snyder. is a 83 y.o. male who has PAOD, does not report claudication symptoms with walking, he finished physical therapy a couple of weeks ago s/p left TKA.  He is now walking without a cane, resumed his stationary bike use. There are no signs of ischemia in his feet or legs.  His ABI remain stable bilaterally.  His DM is in excellent control, he has never used tobacco.   PLAN:  Based on the patient's vascular studies and examination, pt will return to clinic in 6 months with ABI's. I advised him and his wife to notify us if he develops concerns re  the circulation in his feet or legs.   I discussed in depth with the patient the nature of atherosclerosis, and emphasized the importance of maximal medical management including strict control of blood pressure, blood glucose, and lipid levels, obtaining regular exercise, and continued cessation of smoking.  The patient is aware that without maximal medical management the underlying atherosclerotic disease process will progress,  limiting the benefit of any interventions.  The patient was given information about PAD including signs, symptoms, treatment, what symptoms should prompt the patient to seek immediate medical care, and risk reduction measures to take.  Clemon Chambers, RN, MSN, FNP-C [facility] Phone: [phone]  Clinic MD: Bridgett Larsson  02/23/17 2:02 PM

## 2017-02-23 NOTE — Patient Instructions (Addendum)

## 2017-03-05 NOTE — Addendum Note (Signed)
Addended by: Lianne Cure A on: 03/05/2017 04:03 PM   Modules accepted: Orders

## 2017-03-08 ENCOUNTER — Ambulatory Visit: Payer: Medicare Other | Admitting: Podiatry

## 2017-03-15 ENCOUNTER — Ambulatory Visit (INDEPENDENT_AMBULATORY_CARE_PROVIDER_SITE_OTHER): Payer: Medicare Other | Admitting: Podiatry

## 2017-03-15 ENCOUNTER — Encounter: Payer: Self-pay | Admitting: Podiatry

## 2017-03-15 DIAGNOSIS — B351 Tinea unguium: Secondary | ICD-10-CM

## 2017-03-15 DIAGNOSIS — M79671 Pain in right foot: Secondary | ICD-10-CM

## 2017-03-15 DIAGNOSIS — I739 Peripheral vascular disease, unspecified: Secondary | ICD-10-CM | POA: Diagnosis not present

## 2017-03-15 DIAGNOSIS — M79672 Pain in left foot: Secondary | ICD-10-CM | POA: Diagnosis not present

## 2017-03-15 NOTE — Patient Instructions (Signed)
Seen for hypertrophic nails. All nails debrided. Return in 3 months or as needed.  

## 2017-03-15 NOTE — Progress Notes (Signed)
Subjective: 83year old male presents for diabetic foot care for painful toe nails. Had swelling problem but got better after taking Gout medication. Taking Colchicine 0.6 mg q 4-6 hrs.  HPI:  Had left knee replacement surgery on 09/14/16 and been doing well with Rehab. Doing well with blood sugar.   Objective: Thick dystrophic nails both great toes.  Thick broad circular callus under 5th MPJ bilateral. Pedal pulses are not palpable on both feet. Severe digital contracture 2nd bilateral.  No edema or erythema noted.   Subjectitve: Onychomycosis bilateral.  Keratoma sub 5 bilateral PVD.  Painful feet.   Plan: Debrided all calluses and nails.  Return in 3 months or as needed

## 2017-05-09 DIAGNOSIS — Z794 Long term (current) use of insulin: Secondary | ICD-10-CM | POA: Diagnosis not present

## 2017-05-09 DIAGNOSIS — M109 Gout, unspecified: Secondary | ICD-10-CM | POA: Diagnosis not present

## 2017-05-09 DIAGNOSIS — N183 Chronic kidney disease, stage 3 (moderate): Secondary | ICD-10-CM | POA: Diagnosis not present

## 2017-05-09 DIAGNOSIS — E1122 Type 2 diabetes mellitus with diabetic chronic kidney disease: Secondary | ICD-10-CM | POA: Diagnosis not present

## 2017-05-09 DIAGNOSIS — M179 Osteoarthritis of knee, unspecified: Secondary | ICD-10-CM | POA: Diagnosis not present

## 2017-05-09 DIAGNOSIS — I5030 Unspecified diastolic (congestive) heart failure: Secondary | ICD-10-CM | POA: Diagnosis not present

## 2017-05-09 DIAGNOSIS — E669 Obesity, unspecified: Secondary | ICD-10-CM | POA: Diagnosis not present

## 2017-05-09 DIAGNOSIS — E1151 Type 2 diabetes mellitus with diabetic peripheral angiopathy without gangrene: Secondary | ICD-10-CM | POA: Diagnosis not present

## 2017-05-09 DIAGNOSIS — E782 Mixed hyperlipidemia: Secondary | ICD-10-CM | POA: Diagnosis not present

## 2017-05-09 DIAGNOSIS — Z0000 Encounter for general adult medical examination without abnormal findings: Secondary | ICD-10-CM | POA: Diagnosis not present

## 2017-05-09 DIAGNOSIS — C61 Malignant neoplasm of prostate: Secondary | ICD-10-CM | POA: Diagnosis not present

## 2017-05-09 DIAGNOSIS — I130 Hypertensive heart and chronic kidney disease with heart failure and stage 1 through stage 4 chronic kidney disease, or unspecified chronic kidney disease: Secondary | ICD-10-CM | POA: Diagnosis not present

## 2017-06-14 ENCOUNTER — Encounter: Payer: Self-pay | Admitting: Podiatry

## 2017-06-14 ENCOUNTER — Ambulatory Visit (INDEPENDENT_AMBULATORY_CARE_PROVIDER_SITE_OTHER): Payer: Medicare Other | Admitting: Podiatry

## 2017-06-14 DIAGNOSIS — B351 Tinea unguium: Secondary | ICD-10-CM

## 2017-06-14 DIAGNOSIS — L600 Ingrowing nail: Secondary | ICD-10-CM | POA: Diagnosis not present

## 2017-06-14 DIAGNOSIS — I739 Peripheral vascular disease, unspecified: Secondary | ICD-10-CM

## 2017-06-14 NOTE — Patient Instructions (Signed)
Seen for hypertrophic nails and ingrown nail on right great toe. All nails debrided. Right great toe ingrown nail removed and betadine dressing applied. Return in 3 months or as needed.

## 2017-06-14 NOTE — Progress Notes (Signed)
Subjective: 83year old male presents complainign of painful nails.  HPI:  Had left knee replacement surgery on 09/14/16 and been doing well with Rehab. Doing well with blood sugar.   Objective: Thick dystrophic nails x 10. Ingrown hallucal nail lateral border right great toe without infection.  Pedal pulses are not palpable on both feet. Severe digital contracture 2nd bilateral.  No edema or erythema noted.   Subjectitve: Onychomycosis bilateral.  Ingrown nail right great toe painful. PVD.  Painful feet.   Plan: Debrided all nails.  Ingrown right great toe nail removed and betadine dressing applied. Return in 3 months or as needed

## 2017-07-10 DIAGNOSIS — E119 Type 2 diabetes mellitus without complications: Secondary | ICD-10-CM | POA: Diagnosis not present

## 2017-07-10 DIAGNOSIS — Z961 Presence of intraocular lens: Secondary | ICD-10-CM | POA: Diagnosis not present

## 2017-07-10 DIAGNOSIS — H04123 Dry eye syndrome of bilateral lacrimal glands: Secondary | ICD-10-CM | POA: Diagnosis not present

## 2017-07-10 DIAGNOSIS — H35351 Cystoid macular degeneration, right eye: Secondary | ICD-10-CM | POA: Diagnosis not present

## 2017-07-10 DIAGNOSIS — H35371 Puckering of macula, right eye: Secondary | ICD-10-CM | POA: Diagnosis not present

## 2017-08-31 DIAGNOSIS — M25551 Pain in right hip: Secondary | ICD-10-CM | POA: Diagnosis not present

## 2017-09-07 ENCOUNTER — Ambulatory Visit (INDEPENDENT_AMBULATORY_CARE_PROVIDER_SITE_OTHER): Payer: Medicare Other | Admitting: Family

## 2017-09-07 ENCOUNTER — Ambulatory Visit (HOSPITAL_COMMUNITY)
Admission: RE | Admit: 2017-09-07 | Discharge: 2017-09-07 | Disposition: A | Discharge status: Home / Self Care | Payer: Medicare Other | Source: Ambulatory Visit | Attending: Vascular Surgery | Admitting: Vascular Surgery

## 2017-09-07 ENCOUNTER — Encounter: Payer: Self-pay | Admitting: Family

## 2017-09-07 VITALS — BP 198/73 | HR 68 | Temp 98.4°F | Resp 18 | Wt 252.0 lb

## 2017-09-07 DIAGNOSIS — I10 Essential (primary) hypertension: Secondary | ICD-10-CM | POA: Diagnosis not present

## 2017-09-07 DIAGNOSIS — I779 Disorder of arteries and arterioles, unspecified: Secondary | ICD-10-CM | POA: Diagnosis not present

## 2017-09-07 NOTE — Patient Instructions (Signed)

## 2017-09-07 NOTE — Progress Notes (Signed)
[facility] [place]   CC: Follow up peripheral artery occlusive disease  History of Present Illness Damon Snyder. is a 83 y.o. male whom Dr. Bridgett Larsson has been monitoring for PAOD.   Pt was last evaluated by Dr. Bridgett Larsson and K. Trinh, PA-C, on 08-25-16 preoperatively to left knee replacement. At that time pt had lateral knee pain, left worse than right and bilateral moderate PAD The patient's ABIs had improved from his studies a year prior.  He had moderate PAD in L leg with strong monophasic flow in his pedal arteries. Dr. Bridgett Larsson indicated it was reasonable to proceed with L TKR from a vascular viewpoint. Pt was encouraged to continue to stay active using the stationary bike and to optimize his medical conditions. He was to follow-up in 6 months with ABIs. Pt had the left TKA on 09-14-16.   Pt states he had what sounds like an angioplasty and/or a stent placed in in left leg about 2013 in [place].  Pt denies non healing wounds. He denies rest pain. He is no longer using his stationary bike, 30 minutes daily, 5 days/week, states he has intentions to resume this.   Pt denies any history of stroke or TIA, denies any history of MI or cardiac procedures.   He has felt dizzy for about 2 weeks. He saw his PCP a week ago for hip pain, gave him some medication for hip pain.  He denies dyspnea, denies chest pain, denies headache.   Pt Diabetic: Yes, last A1C result on file was 5.8 on 09-07-16 (review of records) Pt smoker: non-smoker  Pt meds include: Statin :Yes Betablocker: Yes ASA: Yes, 325 mg daily Other anticoagulants/antiplatelets: no    Past Medical History:  Diagnosis Date  . Anemia    h/o   . Arthritis    "left knee; shoulders" (09/14/2016)  . Colon cancer ([place])     tx : chemo)   . Difficulty in walking(719.7)   . Dyspnea    relative to pain in her leg  . Foot ulcer ([place])   . Gout   . High cholesterol   . History of blood transfusion    "that's  when they found out about the colon cancer"  . Hypertension   . Lower GI bleed   . Prostate cancer [facility]     treated with radiation   . Type II diabetes mellitus ([place])     Social History Social History   Tobacco Use  . Smoking status: Never Smoker  . Smokeless tobacco: Never Used  Substance Use Topics  . Alcohol use: No    Alcohol/week: 0.0 oz  . Drug use: No    Family History Family History  Problem Relation Age of Onset  . Hypertension Mother   . Cancer Sister        Ovarian  . Hypertension Father   . Heart disease Brother        After age 33  . Kidney disease Sister     Past Surgical History:  Procedure Laterality Date  . CATARACT EXTRACTION W/ INTRAOCULAR LENS  IMPLANT, BILATERAL Bilateral   . COLECTOMY    . JOINT REPLACEMENT    . PROSTATE BIOPSY    . TOTAL KNEE ARTHROPLASTY Left 09/14/2016  . TOTAL KNEE ARTHROPLASTY Left 09/14/2016   Procedure: LEFT TOTAL KNEE ARTHROPLASTY;  Surgeon: Leandrew Koyanagi, MD;  Location: [place];  Service: Orthopedics;  Laterality: Left;    No Known Allergies  Current Outpatient Medications  Medication Sig Dispense Refill  . allopurinol (ZYLOPRIM) 100 MG tablet Take 200 mg by mouth daily as needed.     Marland Kitchen amLODipine (NORVASC) 10 MG tablet Take 10 mg by mouth daily.    Marland Kitchen aspirin EC 325 MG tablet Take 1 tablet (325 mg total) by mouth 2 (two) times daily. 84 tablet 0  . atorvastatin (LIPITOR) 20 MG tablet Take 20 mg by mouth daily.    . benazepril-hydrochlorthiazide (LOTENSIN HCT) 20-12.5 MG per tablet Take 1 tablet by mouth daily.     . colchicine 0.6 MG tablet Take 0.6 mg by mouth every 4 (four) hours as needed (gout).     Marland Kitchen diphenhydrAMINE (BENADRYL) 25 mg capsule Take 25 mg by mouth every 6 (six) hours as needed for itching or allergies.    Marland Kitchen LANTUS SOLOSTAR 100 UNIT/ML Solostar Pen Inject 60 Units into the skin at bedtime.     . Nebivolol HCl (BYSTOLIC) 20 MG TABS Take 20 mg by mouth daily.    Marland Kitchen NOVOFINE 32G X 6 MM MISC     .  sodium chloride (OCEAN) 0.65 % SOLN nasal spray Place 1 spray into both nostrils as needed for congestion.     No current facility-administered medications for this visit.     ROS: See HPI for pertinent positives and negatives.   Physical Examination  Vitals:   09/07/17 1445 09/07/17 1448  BP: (!) 213/82 (!) 198/73  Pulse: 68   Resp: 18   Temp: 98.4 F (36.9 C)   TempSrc: Oral   SpO2: 97%   Weight: 252 lb (114.3 kg)    Body mass index is 30.67 kg/m.  General: A&O x 3, WD, WN obese elderly male  Pulmonary: Sym exp,respirations are non labored, good air movt, CTAB, no rales, rhonchi, & wheezing  Cardiac: Regular rhythm and rate, no appreciable murmur  Vascular: Vessel Right Left  Radial Palpable Palpable  Brachial Palpable Palpable  Carotid Palpable, without bruit Palpable, without bruit  Aorta Not palpable N/A  Femoral Palpable Palpable  Popliteal Not palpable Not palpable  PT Not Palpable Not Palpable  DP Not Palpable Not Palpable   Gastrointestinal: soft, NTND, -G/R, - HSM, - palpable masses, - CVAT B  Musculoskeletal: M/S 5/5 throughout , Extremities without ischemic changes   Skin: No rash, no cellulitis, no ulcers noted.   Neurologic: Pain and light touch intact in extremities , Motor exam as listed above     ASSESSMENT: Damon Snyder. is a 83 y.o. male who has PAOD, does not report claudication symptoms with walking, he has not been walking much due to feeling unsteady on his feet.  There are no signs of ischemia in his feet or legs.  His ABI remain stable bilaterally.  His DM is in excellent control, he has never used tobacco.   I advised pt to notify his PCP today re his uncontrolled blood pressure. He denies dyspnea, denies chest pain, denies headache, but has been feeling dizzy for a couple of weeks.    DATA  ABI (Date: 09/07/2017):  R:   ABI: 0.99 (was 0.89 on 02-23-17),   PT: no waveform morphology  documented  DP: no waveform morphology documented  TBI:  0.36 (0.33)  L:   ABI: 0.61 (was 0.60),   PT: no waveform morphology documented  DP: no waveform morphology documented  TBI: 0.26 (was 0.28)   PLAN:  Based on the patient's vascular studies and examination, pt will return to clinic in 6 months with  ABI's. I advised him and his wife to notify us if he develops concerns re the circulation in his feet or legs.   I discussed in depth with the patient the nature of atherosclerosis, and emphasized the importance of maximal medical management including strict control of blood pressure, blood glucose, and lipid levels, obtaining regular exercise, and continued cessation of smoking.  The patient is aware that without maximal medical management the underlying atherosclerotic disease process will progress, limiting the benefit of any interventions.  The patient was given information about PAD including signs, symptoms, treatment, what symptoms should prompt the patient to seek immediate medical care, and risk reduction measures to take.  Clemon Chambers, RN, MSN, FNP-C [facility] Phone: [phone]  Clinic MD: Chen/Cain  09/07/17 3:07 PM

## 2017-09-13 ENCOUNTER — Ambulatory Visit: Payer: Medicare Other | Admitting: Podiatry

## 2017-09-26 NOTE — Addendum Note (Signed)
Addended by: Lianne Cure A on: 09/26/2017 04:04 PM   Modules accepted: Orders

## 2017-11-14 DIAGNOSIS — E782 Mixed hyperlipidemia: Secondary | ICD-10-CM | POA: Diagnosis not present

## 2017-11-14 DIAGNOSIS — E669 Obesity, unspecified: Secondary | ICD-10-CM | POA: Diagnosis not present

## 2017-11-14 DIAGNOSIS — E1151 Type 2 diabetes mellitus with diabetic peripheral angiopathy without gangrene: Secondary | ICD-10-CM | POA: Diagnosis not present

## 2017-11-14 DIAGNOSIS — I130 Hypertensive heart and chronic kidney disease with heart failure and stage 1 through stage 4 chronic kidney disease, or unspecified chronic kidney disease: Secondary | ICD-10-CM | POA: Diagnosis not present

## 2017-11-14 DIAGNOSIS — Z23 Encounter for immunization: Secondary | ICD-10-CM | POA: Diagnosis not present

## 2017-11-14 DIAGNOSIS — Z794 Long term (current) use of insulin: Secondary | ICD-10-CM | POA: Diagnosis not present

## 2017-11-14 DIAGNOSIS — E1122 Type 2 diabetes mellitus with diabetic chronic kidney disease: Secondary | ICD-10-CM | POA: Diagnosis not present

## 2017-11-14 DIAGNOSIS — I5030 Unspecified diastolic (congestive) heart failure: Secondary | ICD-10-CM | POA: Diagnosis not present

## 2017-11-14 DIAGNOSIS — N183 Chronic kidney disease, stage 3 (moderate): Secondary | ICD-10-CM | POA: Diagnosis not present

## 2017-11-14 DIAGNOSIS — I739 Peripheral vascular disease, unspecified: Secondary | ICD-10-CM | POA: Diagnosis not present

## 2017-11-14 DIAGNOSIS — C61 Malignant neoplasm of prostate: Secondary | ICD-10-CM | POA: Diagnosis not present

## 2017-11-14 DIAGNOSIS — Z6834 Body mass index (BMI) 34.0-34.9, adult: Secondary | ICD-10-CM | POA: Diagnosis not present

## 2018-03-15 ENCOUNTER — Ambulatory Visit (HOSPITAL_COMMUNITY)
Admission: RE | Admit: 2018-03-15 | Discharge: 2018-03-15 | Disposition: A | Discharge status: Home / Self Care | Payer: Medicare Other | Source: Ambulatory Visit | Attending: Family | Admitting: Family

## 2018-03-15 ENCOUNTER — Ambulatory Visit (INDEPENDENT_AMBULATORY_CARE_PROVIDER_SITE_OTHER): Payer: Medicare Other | Admitting: Family

## 2018-03-15 ENCOUNTER — Other Ambulatory Visit: Payer: Self-pay

## 2018-03-15 ENCOUNTER — Encounter: Payer: Self-pay | Admitting: Family

## 2018-03-15 VITALS — BP 150/70 | HR 65 | Temp 97.2°F | Resp 16 | Ht 76.0 in | Wt 254.0 lb

## 2018-03-15 DIAGNOSIS — I10 Essential (primary) hypertension: Secondary | ICD-10-CM | POA: Diagnosis not present

## 2018-03-15 DIAGNOSIS — I779 Disorder of arteries and arterioles, unspecified: Secondary | ICD-10-CM

## 2018-03-15 DIAGNOSIS — E785 Hyperlipidemia, unspecified: Secondary | ICD-10-CM | POA: Diagnosis not present

## 2018-03-15 DIAGNOSIS — E1151 Type 2 diabetes mellitus with diabetic peripheral angiopathy without gangrene: Secondary | ICD-10-CM | POA: Insufficient documentation

## 2018-03-15 NOTE — Patient Instructions (Signed)

## 2018-03-15 NOTE — Progress Notes (Signed)
Vitals:   03/15/18 1429  BP: (!) 158/72  Pulse: 65  Resp: 16  Temp: (!) 97.2 F (36.2 C)  TempSrc: Oral  SpO2: 98%  Weight: 254 lb (115.2 kg)  Height: 6\' 4"  (1.93 m)

## 2018-03-15 NOTE — Progress Notes (Signed)
[facility] [place]   CC: Follow up peripheral artery occlusive disease  History of Present Illness Damon Snyder. is a 84 y.o. male whom Dr. Bridgett Larsson has been monitoring for PAOD.   Pt was last evaluated by Dr. Bridgett Larsson and K. Trinh, PA-C, on 08-25-16 preoperatively to left knee replacement. At that time pt had lateral knee pain, left worse than rightand bilateralmoderate PAD The patient's ABIs hadimproved from his studies a year prior.  Hehadmoderate PAD in L leg with strong monophasic flow in his pedal arteries. Dr. Bridgett Larsson indicatedit was reasonable to proceed with L TKR from a vascular viewpoint. Pt was encouragedto continue to stay active using the stationary bike and to optimize his medical conditions. He was tofollow-up in 6 months with ABIs. Pt had the left TKA on 09-14-16.  Pt states he had what sounds like an angioplasty and/or a stent placed in in left leg about 2013 in [place].  Pt denies non healing wounds. He denies rest pain. He is using his stationary bike, 30 minutes daily, 5 days/week.  After walking about 1 block his right leg feels tired.   Pt denies any history of stroke or TIA, denies any history of MI or cardiac procedures.   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   Pt meds include: Statin :Yes Betablocker:Yes ASA:Yes, 81 mg daily Other anticoagulants/antiplatelets:no     Past Medical History:  Diagnosis Date  . Anemia    h/o   . Arthritis    "left knee; shoulders" (09/14/2016)  . Colon cancer ([place])     tx : chemo)   . Difficulty in walking(719.7)   . Dyspnea    relative to pain in her leg  . Foot ulcer ([place])   . Gout   . High cholesterol   . History of blood transfusion    "that's when they found out about the colon cancer"  . Hypertension   . Lower GI bleed   . Prostate cancer [facility])      treated with radiation   . Type II diabetes mellitus ([place])     Social History Social History   Tobacco Use  . Smoking status: Never Smoker  . Smokeless tobacco: Never Used  Substance Use Topics  . Alcohol use: No    Alcohol/week: 0.0 oz  . Drug use: No    Family History Family History  Problem Relation Age of Onset  . Hypertension Mother   . Cancer Sister        Ovarian  . Hypertension Father   . Heart disease Brother        After age 46  . Kidney disease Sister     Past Surgical History:  Procedure Laterality Date  . CATARACT EXTRACTION W/ INTRAOCULAR LENS  IMPLANT, BILATERAL Bilateral   . COLECTOMY    . JOINT REPLACEMENT    . PROSTATE BIOPSY    . TOTAL KNEE ARTHROPLASTY Left 09/14/2016  . TOTAL KNEE ARTHROPLASTY Left 09/14/2016   Procedure: LEFT TOTAL KNEE ARTHROPLASTY;  Surgeon: Leandrew Koyanagi, MD;  Location: [place];  Service: Orthopedics;  Laterality: Left;    No Known Allergies  Current Outpatient Medications  Medication Sig Dispense Refill  . allopurinol (ZYLOPRIM) 100 MG tablet Take 200 mg by mouth daily as needed.     Marland Kitchen amLODipine (NORVASC) 10 MG tablet Take 10 mg by mouth daily.    Marland Kitchen  aspirin EC 325 MG tablet Take 1 tablet (325 mg total) by mouth 2 (two) times daily. 84 tablet 0  . atorvastatin (LIPITOR) 20 MG tablet Take 20 mg by mouth daily.    . benazepril-hydrochlorthiazide (LOTENSIN HCT) 20-12.5 MG per tablet Take 1 tablet by mouth daily.     . colchicine 0.6 MG tablet Take 0.6 mg by mouth every 4 (four) hours as needed (gout).     Marland Kitchen diphenhydrAMINE (BENADRYL) 25 mg capsule Take 25 mg by mouth every 6 (six) hours as needed for itching or allergies.    . hydrochlorothiazide (HYDRODIURIL) 25 MG tablet TAKE 1 TABLET BY MOUTH EVERY DAY IN THE MORNING  5  . LANTUS SOLOSTAR 100 UNIT/ML Solostar Pen Inject 60 Units into the skin at bedtime.     . Nebivolol HCl (BYSTOLIC) 20 MG TABS Take 20 mg by mouth daily.    Marland Kitchen NOVOFINE 32G X 6 MM MISC     . sodium chloride  (OCEAN) 0.65 % SOLN nasal spray Place 1 spray into both nostrils as needed for congestion.     No current facility-administered medications for this visit.     ROS: See HPI for pertinent positives and negatives.   Physical Examination  Vitals:   03/15/18 1429 03/15/18 1433  BP: (!) 158/72 (!) 150/70  Pulse: 65 65  Resp: 16   Temp: (!) 97.2 F (36.2 C)   TempSrc: Oral   SpO2: 98%   Weight: 254 lb (115.2 kg)   Height: 6\' 4"  (1.93 m)    Body mass index is 30.92 kg/m.  General: A&O x 3, WD, WNobese elderly male Gait: slow, deliberate, steady HEENT: WNL, no gross abnormalities  Pulmonary: Sym exp,respirations are non labored,good air movement in all fields, CTAB, no rales, rhonchi, or wheezing Cardiac: Regular rhythm and rate,no appreciable murmur  Vascular: Vessel Right Left  Radial Palpable Palpable  Brachial Palpable Palpable  Carotid Palpable, without bruit Palpable, without bruit  Aorta Not palpable N/A  Femoral 2+Palpable 2+Palpable  Popliteal Not palpable Not palpable  PT Not Palpable Not Palpable  DP Not Palpable Not Palpable   Gastrointestinal: soft, NTND, -G/R, - HSM, - palpable masses, - CVAT B Musculoskeletal: M/S 5/5 throughout , Extremities without ischemic changes. Hammertoes both 2nd toes.  Skin: No rash, no cellulitis, no ulcers noted. Several thick toenails.  Neurologic: A&O X 3; appropriate affect, Sensation is normal; MOTOR FUNCTION:  moving all extremities equally, motor strength 5/5 throughout. Speech is fluent/normal. CN 2-12 intact. Psychiatric: Thought content is normal, mood appropriate for clinical situation.    ASSESSMENT: Merdith Snyder. is a 84 y.o. male who has PAOD. After walking 1 block his right leg feels tired. He also has pain in his right knee.  He has not been walking much due to feeling unsteady on his feet. ABI's show calcified vessels with monophasic waveforms and very low toe pressure in the  right great toe.   There are no signs of ischemia in his feet or legs.   His DM is in excellent control, he has never used tobacco, but he processed tobacco for several years, tobacco dust was in that environment.  He takes a daily statin and ASA.    DATA  ABI (Date: 03/15/2018):  R:   ABI: 1.01 (was 0.99 on 09-07-17),   PT: mono  DP: mono  TBI:  0.17, toe pressure 28  (was 0.36)  L:   ABI: 0.87 (was 0.61),   PT: mono  DP: mono  TBI: 0.24, toe pressure 40 (was 0.26)   Bilateral ABI's are falsely elevated due to arterial wall calcification, monophasic waveform morphology does not correspond to normal and mild disease. Very low right TBI and toe pressure.       PLAN:  Pt requests referral to [facility] for regular DM foot exams and to trim his thick toenails.  He also has PAD.   Daily seated leg exercises discussed and demonstrated.   Based on the patient's vascular studies and examination, pt will return to clinic in62monthswith ABI's.  I advised him and his wife to notify us if he develops concerns re the circulation in his feet or legs.    I discussed in depth with the patient the nature of atherosclerosis, and emphasized the importance of maximal medical management including strict control of blood pressure, blood glucose, and lipid levels, obtaining regular exercise, and continued cessation of smoking.  The patient is aware that without maximal medical management the underlying atherosclerotic disease process will progress, limiting the benefit of any interventions.  The patient was given information about PAD including signs, symptoms, treatment, what symptoms should prompt the patient to seek immediate medical care, and risk reduction measures to take.  Clemon Chambers, RN, MSN, FNP-C [facility] Phone: [phone]  Clinic MD: Donzetta Matters  03/15/18 2:35 PM

## 2018-04-04 ENCOUNTER — Other Ambulatory Visit: Payer: Self-pay

## 2018-04-04 DIAGNOSIS — I779 Disorder of arteries and arterioles, unspecified: Secondary | ICD-10-CM

## 2018-04-04 DIAGNOSIS — I739 Peripheral vascular disease, unspecified: Secondary | ICD-10-CM

## 2018-04-04 DIAGNOSIS — I70213 Atherosclerosis of native arteries of extremities with intermittent claudication, bilateral legs: Secondary | ICD-10-CM

## 2018-04-12 ENCOUNTER — Encounter: Payer: Self-pay | Admitting: Podiatry

## 2018-04-12 ENCOUNTER — Ambulatory Visit (INDEPENDENT_AMBULATORY_CARE_PROVIDER_SITE_OTHER): Payer: Medicare Other | Admitting: Podiatry

## 2018-04-12 VITALS — BP 172/71 | HR 63

## 2018-04-12 DIAGNOSIS — E1151 Type 2 diabetes mellitus with diabetic peripheral angiopathy without gangrene: Secondary | ICD-10-CM

## 2018-04-12 DIAGNOSIS — M79675 Pain in left toe(s): Secondary | ICD-10-CM

## 2018-04-12 DIAGNOSIS — L84 Corns and callosities: Secondary | ICD-10-CM | POA: Diagnosis not present

## 2018-04-12 DIAGNOSIS — B351 Tinea unguium: Secondary | ICD-10-CM

## 2018-04-12 DIAGNOSIS — M79674 Pain in right toe(s): Secondary | ICD-10-CM

## 2018-04-12 NOTE — Patient Instructions (Addendum)

## 2018-04-14 NOTE — Progress Notes (Signed)
Subjective: Damon Snyder is an 84 yo AAM who presents today with diabetes, diabetic neuropathy and cc of painful, discolored, thick toenails which interfere with daily activities. In particular, he has severely ingrown toenails b/l great toes. He had history of abscess to left great toe secondary to paronychia in 2013 treated at [facility].    Per review of chart, he has had previous foot care with Podiatry Team in [facility].  Medical History    Date Unknown Anemia   Date Unknown Arthritis   Date Unknown Colon cancer [facility])   Date Unknown Difficulty in walking(719.7)  Date Unknown Dyspnea   Date Unknown Foot ulcer ([place])  Date Unknown Gout  Date Unknown High cholesterol  Date Unknown History of blood transfusion   Date Unknown Hypertension  Date Unknown Lower GI bleed  Date Unknown Prostate cancer [facility])   Date Unknown Type II diabetes mellitus ([place])   Problem List   Cardiovascular and Mediastinum   Chronic diastolic heart failure ([place])   PVD (peripheral vascular disease) ([place])   Near syncope   Essential hypertension, benign  Endocrine   Type II or unspecified type diabetes mellitus without mention of complication, uncontrolled  Nervous and Auditory   Bilateral leg weakness  Musculoskeletal and Integument   Onychomycosis due to dermatophyte   Primary osteoarthritis of left knee  Other   Pain in joint, ankle and foot   Pain in lower limb   Atherosclerosis of native arteries of extremity with intermittent claudication ([facility])   Total knee replacement status   Surgical History   09/14/2016 Total knee arthroplasty (Left)  09/14/2016 Total knee arthroplasty (Left)   Date Unknown Cataract extraction w/ intraocular lens implant, bilateral (Bilateral)  Date Unknown Colectomy  Date Unknown Joint replacement  Date Unknown Prostate biopsy   Medications    allopurinol (ZYLOPRIM) 100 MG tablet    amLODipine (NORVASC) 10 MG tablet    aspirin EC 325 MG tablet    atorvastatin (LIPITOR) 20 MG tablet    hydrochlorothiazide (HYDRODIURIL) 25 MG tablet    LANTUS SOLOSTAR 100 UNIT/ML Solostar Pen    Nebivolol HCl (BYSTOLIC) 20 MG TABS    NOVOFINE 32G X 6 MM MISC    sodium chloride (OCEAN) 0.65 % SOLN nasal spray    benazepril-hydrochlorthiazide (LOTENSIN HCT) 20-12.5 MG per tablet    colchicine 0.6 MG tablet    diphenhydrAMINE (BENADRYL) 25 mg capsule    Allergies      No Known Allergies   Tobacco History   Smoking Status  Never Smoker  Smokeless Tobacco Status  Never Used   Family History Collapse by Default  Collapse by Default      Mother (Deceased at age 50) Hypertension         Sister (Deceased) Cancer          Father (Deceased at age 26) Hypertension         Brother Heart disease          Sister (Deceased) Kidney disease    ROS: Per HPI unless specifically indicated in ROS section   Objective: Vitals:   04/12/18 1410  BP: (!) 172/71  Pulse: 63   Vascular Examination: Capillary refill time <3 seconds x 10 digits Dorsalis pedis pulses absent b/l Posterior tibial pulses absent b/l No digital hair x 10 digits Skin temperature warm to cool b/l  Dermatological Examination: Pedal skin thin, shiny and atrophic b/l  Toenails 1-5 b/l discolored, thick, dystrophic with subungual debris and pain with palpation  to nailbeds due to thickness of nails.   Bilateral hallux nails severely incurvated at medial and lateral nail borders with noted keratotic debris at borders. Evidence of stage 1 pressure ulcer at medial border evidenced by subcutaneous hemorrhage. No flocculence, no edema, no purulence.  Hyperkeratotic lesion noted  submetatarsal head 5 right foot  Musculoskeletal: Muscle strength 5/5 to all LE muscle groups Hammertoe deformity b/l 2nd digit  Neurological: Sensation diminished with 10 gram monofilament. Vibratory sensation diminished  Assessment: 1. Painful onychomycosis toenails 1-5 b/l 2. Callus  submetatarsal head 5 right foot 3. NIDDM with Peripheral arterial disease 4. Hammertoe deformity b/l 2nd digit  Plan: 1. Discuss diabetic foot care principles. Literature dispensed. 2. Toenails 1-5 b/l were debrided in length and girth. Pinpoint bleeding right great toe lateral nail border treated with Lumicain hemostatic solution. Antibiotic cream applied to both borders of both great toes. No further treatment required by patient. 3. Hyperkeratotic lesion debrided submetatarsal head 5 right foot. 4. Patient to continue soft, supportive shoe gear 5. Patient to report any pedal injuries to medical professional  6. Follow up 3 months.  7. Will schedule him for eval/measurement for diabetic shoes with Velora Heckler. Qualifying diagnoses:       NIDDM with PAD, hammertoe deformity, callus       submetatarsal head 5 right foot. 8. Patient/POA to call should there be a concern in the interim.

## 2018-04-15 ENCOUNTER — Ambulatory Visit: Payer: Medicare Other | Admitting: Orthotics

## 2018-04-15 DIAGNOSIS — E1151 Type 2 diabetes mellitus with diabetic peripheral angiopathy without gangrene: Secondary | ICD-10-CM

## 2018-04-15 DIAGNOSIS — I739 Peripheral vascular disease, unspecified: Secondary | ICD-10-CM

## 2018-04-15 DIAGNOSIS — L84 Corns and callosities: Secondary | ICD-10-CM

## 2018-04-15 NOTE — Progress Notes (Signed)
Patient presents today for diabetic shoe measurement and foam casting.  Goals of diabetic shoes/inserts to offer protection from conditions secondary to DM2, offer relief from sheer forces that could lead to ulcerations, protect the foot, and offer greater stability. Patient is under supervision of DPM [facility] Physician managing patients DM2: [place] sHAW  Patient has following documented conditions to qualify for diabetic shoes/inserts: Patient measured with brannock device:12 W

## 2018-05-16 ENCOUNTER — Telehealth: Payer: Self-pay | Admitting: Podiatry

## 2018-05-16 NOTE — Telephone Encounter (Signed)
Called and spoke to pts wife about his diabetic shoe paperwork. Pt was seen by Dr Mayra Neer in february and it has to be within 6 months. Pts wife stated pt has an appt on 8.27 and I told her I would refax the paperwork to Dr Brigitte Pulse on [phone]

## 2018-05-21 DIAGNOSIS — M109 Gout, unspecified: Secondary | ICD-10-CM | POA: Diagnosis not present

## 2018-05-21 DIAGNOSIS — E669 Obesity, unspecified: Secondary | ICD-10-CM | POA: Diagnosis not present

## 2018-05-21 DIAGNOSIS — Z0000 Encounter for general adult medical examination without abnormal findings: Secondary | ICD-10-CM | POA: Diagnosis not present

## 2018-05-21 DIAGNOSIS — E1151 Type 2 diabetes mellitus with diabetic peripheral angiopathy without gangrene: Secondary | ICD-10-CM | POA: Diagnosis not present

## 2018-05-21 DIAGNOSIS — E782 Mixed hyperlipidemia: Secondary | ICD-10-CM | POA: Diagnosis not present

## 2018-05-21 DIAGNOSIS — I130 Hypertensive heart and chronic kidney disease with heart failure and stage 1 through stage 4 chronic kidney disease, or unspecified chronic kidney disease: Secondary | ICD-10-CM | POA: Diagnosis not present

## 2018-05-21 DIAGNOSIS — M179 Osteoarthritis of knee, unspecified: Secondary | ICD-10-CM | POA: Diagnosis not present

## 2018-05-21 DIAGNOSIS — C61 Malignant neoplasm of prostate: Secondary | ICD-10-CM | POA: Diagnosis not present

## 2018-05-21 DIAGNOSIS — N183 Chronic kidney disease, stage 3 (moderate): Secondary | ICD-10-CM | POA: Diagnosis not present

## 2018-05-21 DIAGNOSIS — Z23 Encounter for immunization: Secondary | ICD-10-CM | POA: Diagnosis not present

## 2018-05-21 DIAGNOSIS — I5030 Unspecified diastolic (congestive) heart failure: Secondary | ICD-10-CM | POA: Diagnosis not present

## 2018-05-21 DIAGNOSIS — E1122 Type 2 diabetes mellitus with diabetic chronic kidney disease: Secondary | ICD-10-CM | POA: Diagnosis not present

## 2018-05-29 ENCOUNTER — Ambulatory Visit: Payer: Medicare Other | Admitting: Orthotics

## 2018-05-29 DIAGNOSIS — I739 Peripheral vascular disease, unspecified: Secondary | ICD-10-CM

## 2018-05-29 DIAGNOSIS — M2042 Other hammer toe(s) (acquired), left foot: Secondary | ICD-10-CM | POA: Diagnosis not present

## 2018-05-29 DIAGNOSIS — E1151 Type 2 diabetes mellitus with diabetic peripheral angiopathy without gangrene: Secondary | ICD-10-CM

## 2018-05-29 DIAGNOSIS — M2041 Other hammer toe(s) (acquired), right foot: Secondary | ICD-10-CM | POA: Diagnosis not present

## 2018-05-29 DIAGNOSIS — L84 Corns and callosities: Secondary | ICD-10-CM

## 2018-05-31 NOTE — Progress Notes (Signed)
Patient had appointment today for definitive and final diabetic shoe fitting and delivery.  Patient was seen by Betha, C.Ped, O[facility].   The inserts fit well and accomplished full contact with the plantar surface of the foot bilateral; the shoes fit well and offered forefoot freedom, no noticible heel slippage, and good toe clearance w/ the insert in place.  Patient was advised to monitor of any skin irritation, breakdown.  Patient was satisfied with fit and function. 

## 2018-07-10 DIAGNOSIS — E119 Type 2 diabetes mellitus without complications: Secondary | ICD-10-CM | POA: Diagnosis not present

## 2018-07-10 DIAGNOSIS — H40013 Open angle with borderline findings, low risk, bilateral: Secondary | ICD-10-CM | POA: Diagnosis not present

## 2018-07-10 DIAGNOSIS — Z961 Presence of intraocular lens: Secondary | ICD-10-CM | POA: Diagnosis not present

## 2018-07-10 DIAGNOSIS — H04123 Dry eye syndrome of bilateral lacrimal glands: Secondary | ICD-10-CM | POA: Diagnosis not present

## 2018-07-10 DIAGNOSIS — H35351 Cystoid macular degeneration, right eye: Secondary | ICD-10-CM | POA: Diagnosis not present

## 2018-07-15 ENCOUNTER — Encounter: Payer: Self-pay | Admitting: Podiatry

## 2018-07-15 ENCOUNTER — Ambulatory Visit (INDEPENDENT_AMBULATORY_CARE_PROVIDER_SITE_OTHER): Payer: Medicare Other | Admitting: Podiatry

## 2018-07-15 DIAGNOSIS — B351 Tinea unguium: Secondary | ICD-10-CM

## 2018-07-15 DIAGNOSIS — M79674 Pain in right toe(s): Secondary | ICD-10-CM

## 2018-07-15 DIAGNOSIS — L84 Corns and callosities: Secondary | ICD-10-CM

## 2018-07-15 DIAGNOSIS — E1151 Type 2 diabetes mellitus with diabetic peripheral angiopathy without gangrene: Secondary | ICD-10-CM

## 2018-07-15 DIAGNOSIS — M79675 Pain in left toe(s): Secondary | ICD-10-CM

## 2018-07-15 DIAGNOSIS — I70219 Atherosclerosis of native arteries of extremities with intermittent claudication, unspecified extremity: Secondary | ICD-10-CM

## 2018-07-24 MED ORDER — CLOTRIMAZOLE 1 % EX CREA: TOPICAL_CREAM | CUTANEOUS | 1 refills | Status: DC

## 2018-07-24 NOTE — Progress Notes (Addendum)
Subjective: [name] presents to clinic today for foot care follow-up of painful mycotic toenails.  He relates pain when wearing enclosed shoe gear.  He does have history of type 2 diabetes.  He also has history of peripheral arterial disease with intermittent claudication and is followed by vascular for this.  Objective: Vascular examination: Nonpalpable pedal pulses b/l +Intermittent claudication No signs of ischemia   No evidence of wounds.  No evidence of interdigital maceration.  Muscle strength examination was 5 out of 5 to all muscle groups.  Toenails 1 through 5 bilaterally are noted to be thick dystrophic discolored with subungual subungual debris with tenderness to palpation.  There is no edema, erythema, or drainage to any digit.  Hyperkeratotic lesions noted submetatarsal head 5 right foot.  There is no surrounding erythema, edema, flocculence, or impending wound noted.  Assessment: 1.  Painful onychomycosis toenails 1 through 5 bilaterally and patient with diabetes and peripheral vascular disease. 2.  Callus submetatarsal head 5 right foot  Plan: 1.  Toenails 1 through 5 bilaterally were debrided in length and girth without complication. 2.  Callus submetatarsal head 5 right foot  pared utilizing sterile sharp debridement. 3.  [name] is to continue soft supportive shoe gear daily. 4.   [name] is to report any pedal injuries to a medical professional immediately. 5.   Follow-up 3 months for routine diabetic foot care.  Patient to call should there be a concern in the interim.

## 2018-07-24 NOTE — Addendum Note (Signed)
Addended by: Marzetta Board on: 07/24/2018 10:23 PM   Modules accepted: Orders

## 2018-08-29 DIAGNOSIS — I130 Hypertensive heart and chronic kidney disease with heart failure and stage 1 through stage 4 chronic kidney disease, or unspecified chronic kidney disease: Secondary | ICD-10-CM | POA: Diagnosis not present

## 2018-08-29 DIAGNOSIS — M25512 Pain in left shoulder: Secondary | ICD-10-CM | POA: Diagnosis not present

## 2018-09-05 ENCOUNTER — Telehealth (HOSPITAL_COMMUNITY): Payer: Self-pay | Admitting: Surgery

## 2018-09-05 NOTE — Telephone Encounter (Signed)
Attempted to contact patient to confirm appointments for 09/06/2018 [facility]

## 2018-09-06 ENCOUNTER — Encounter: Payer: Self-pay | Admitting: Family

## 2018-09-06 ENCOUNTER — Other Ambulatory Visit: Payer: Self-pay

## 2018-09-06 ENCOUNTER — Ambulatory Visit (INDEPENDENT_AMBULATORY_CARE_PROVIDER_SITE_OTHER): Payer: Medicare Other | Admitting: Family

## 2018-09-06 ENCOUNTER — Ambulatory Visit (HOSPITAL_COMMUNITY)
Admission: RE | Admit: 2018-09-06 | Discharge: 2018-09-06 | Disposition: A | Discharge status: Home / Self Care | Payer: Medicare Other | Source: Ambulatory Visit | Attending: Family | Admitting: Family

## 2018-09-06 VITALS — BP 177/77 | HR 55 | Temp 96.5°F | Resp 18 | Ht 76.0 in | Wt 252.0 lb

## 2018-09-06 DIAGNOSIS — I779 Disorder of arteries and arterioles, unspecified: Secondary | ICD-10-CM

## 2018-09-06 DIAGNOSIS — I739 Peripheral vascular disease, unspecified: Secondary | ICD-10-CM | POA: Diagnosis not present

## 2018-09-06 DIAGNOSIS — I70213 Atherosclerosis of native arteries of extremities with intermittent claudication, bilateral legs: Secondary | ICD-10-CM | POA: Insufficient documentation

## 2018-09-06 NOTE — Progress Notes (Signed)
[facility] [place]   CC: Follow up peripheral artery occlusive disease  History of Present Illness [name]. is a 84 y.o. male whom Dr. Bridgett Larsson had been monitoring for PAOD.   Pt was evaluated by Dr. Bridgett Larsson and K. Trinh, PA-C, on 08-25-16 preoperatively to left knee replacement. At that time pt had lateral knee pain, left worse than rightand bilateralmoderate PAD The patient's ABIs hadimproved from his studies a year prior.  Hehadmoderate PAD in L leg with strong monophasic flow in his pedal arteries. Dr. Bridgett Larsson indicatedit was reasonable to proceed with L TKR from a vascular viewpoint. Pt was encouragedto continue to stay active using the stationary bike and to optimize his medical conditions. He was tofollow-up in 6 months with ABIs. Pt had the left TKA on 09-14-16.  Pt states he had what sounds like an angioplasty and/or a stent placed in in left leg about 2013 in [place].  Pt denies non healing wounds. He denies rest pain. Heis using hisstationary bike, 30 minutes daily, 5 days/week.  After walking about 1 block his right leg feels tired.   Pt denies any history of stroke or TIA, denies any history of MI or cardiac procedures.  He is seeing [facility] for regular DM foot exams and to trim his thick toenails.    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   Pt meds include: Statin :Yes Betablocker:Yes ASA:Yes, 81 mg daily Other anticoagulants/antiplatelets:no    Past Medical History:  Diagnosis Date  . Anemia    h/o   . Arthritis    "left knee; shoulders" (09/14/2016)  . Colon cancer ([place])     tx : chemo)   . Difficulty in walking(719.7)   . Dyspnea    relative to pain in her leg  . Foot ulcer ([place])   . Gout   . High cholesterol   . History of blood transfusion    "that's when they  found out about the colon cancer"  . Hypertension   . Lower GI bleed   . Prostate cancer [facility])     treated with radiation   . Type II diabetes mellitus ([place])     Social History Social History   Tobacco Use  . Smoking status: Never Smoker  . Smokeless tobacco: Never Used  Substance Use Topics  . Alcohol use: No    Alcohol/week: 0.0 standard drinks  . Drug use: No    Family History Family History  Problem Relation Age of Onset  . Hypertension Mother   . Cancer Sister        Ovarian  . Hypertension Father   . Heart disease Brother        After age 59  . Kidney disease Sister     Past Surgical History:  Procedure Laterality Date  . CATARACT EXTRACTION W/ INTRAOCULAR LENS  IMPLANT, BILATERAL Bilateral   . COLECTOMY    . JOINT REPLACEMENT    . PROSTATE BIOPSY    . TOTAL KNEE ARTHROPLASTY Left 09/14/2016  . TOTAL KNEE ARTHROPLASTY Left 09/14/2016   Procedure: LEFT TOTAL KNEE ARTHROPLASTY;  Surgeon: Leandrew Koyanagi, MD;  Location: [place] [facility];  Service: Orthopedics;  Laterality: Left;    No Known Allergies  Current Outpatient Medications  Medication Sig Dispense Refill  . allopurinol (ZYLOPRIM) 100 MG tablet Take 200 mg by mouth daily as needed.     Marland Kitchen  amLODipine (NORVASC) 10 MG tablet Take 10 mg by mouth daily.    Marland Kitchen aspirin EC 325 MG tablet Take 1 tablet (325 mg total) by mouth 2 (two) times daily. 84 tablet 0  . atorvastatin (LIPITOR) 20 MG tablet Take 20 mg by mouth daily.    . benazepril-hydrochlorthiazide (LOTENSIN HCT) 20-12.5 MG per tablet Take 1 tablet by mouth daily.     . colchicine 0.6 MG tablet Take 0.6 mg by mouth every 4 (four) hours as needed (gout).     Marland Kitchen diphenhydrAMINE (BENADRYL) 25 mg capsule Take 25 mg by mouth every 6 (six) hours as needed for itching or allergies.    . hydrochlorothiazide (HYDRODIURIL) 25 MG tablet TAKE 1 TABLET BY MOUTH EVERY DAY IN THE MORNING  5  . LANTUS SOLOSTAR 100 UNIT/ML Solostar Pen Inject 60 Units into the skin at bedtime.     .  Nebivolol HCl (BYSTOLIC) 20 MG TABS Take 20 mg by mouth daily.    Marland Kitchen NOVOFINE 32G X 6 MM MISC     . sodium chloride (OCEAN) 0.65 % SOLN nasal spray Place 1 spray into both nostrils as needed for congestion.     No current facility-administered medications for this visit.     ROS: See HPI for pertinent positives and negatives.   Physical Examination  Vitals:   09/06/18 1523  BP: (!) 177/77  Pulse: (!) 55  Resp: 18  Temp: (!) 96.5 F (35.8 C)  TempSrc: Oral  SpO2: 98%  Weight: 252 lb (114.3 kg)  Height: 6\' 4"  (1.93 m)   Body mass index is 30.67 kg/m.  General: A&O x 3, WD, WNobese elderly male Gait: slow, deliberate, steady HEENT: WNL, no gross abnormalities  Pulmonary: Sym exp,respirations are non labored,good air movement in all fields, CTAB, no rales, rhonchi, or wheezing Cardiac: Regular rhythm, bradycardic (on a beta blocker),no appreciable murmur  Vascular: Vessel Right Left  Radial Palpable Palpable  Brachial Palpable Palpable  Carotid Palpable, without bruit Palpable, without bruit  Aorta Not palpable N/A  Femoral 2+Palpable 2+Palpable  Popliteal Not palpable Not palpable  PT Not Palpable Not Palpable  DP Not Palpable Not Palpable   Gastrointestinal: soft, NTND, -G/R, - HSM, - palpable masses, - CVAT B Musculoskeletal: M/S 5/5 throughout , Extremities without ischemic changes. Hammertoes both 2nd toes.  Skin: No rash, no cellulitis, no ulcers noted.Several thick trimmed toenails.  Neurologic: A&O X 3; appropriate affect, Sensation is normal; MOTOR FUNCTION:  moving all extremities equally, motor strength 5/5 throughout. Speech is fluent/normal. CN 2-12 intact. Psychiatric: Thought content is normal, mood appropriate for clinical situation    ASSESSMENT: Damon Snyder. is a 84 y.o. male who has PAOD. After walking 1 block his right leg feels tired. He also has pain in his right knee.  Hehas not been walking much due to his  right foot arch bothering him.   ABI's show calcified vessels with monophasic waveforms in the right, mono and biphasic in the left.  There are no signs of ischemia in his feet or legs.   I advised him to discuss his right foot arch pain with his podiatrist.   His DM is in excellent control, he has never used tobacco, but he processed tobacco for several years, tobacco dust was in that environment.  He takes a daily statin and ASA.    DATA  ABI Findings (09-06-18):  +---------+------------------+-----+----------+--------+  Right  Rt Pressure (mmHg)IndexWaveform Comment   +---------+------------------+-----+----------+--------+  Brachial 187  triphasic       +---------+------------------+-----+----------+--------+  PTA   162        0.87 monophasic      +---------+------------------+-----+----------+--------+  DP    123        0.66 monophasic      +---------+------------------+-----+----------+--------+  Great Toe73        0.39 Abnormal       +---------+------------------+-----+----------+--------+    +---------+------------------+-----+----------+----------------------+  Left   Lt Pressure (mmHg)IndexWaveform Comment          +---------+------------------+-----+----------+----------------------+  Brachial 181           triphasic              +---------+------------------+-----+----------+----------------------+  PTA   74        0.40 biphasic              +---------+------------------+-----+----------+----------------------+  PERO   84        0.45 monophasic             +---------+------------------+-----+----------+----------------------+  Great Toe            Abnormal >255; Non-compressible  +---------+------------------+-----+----------+----------------------+     +-------+-----------+-----------+------------+------------+  ABI/TBIToday's ABIToday's TBIPrevious ABIPrevious TBI  +-------+-----------+-----------+------------+------------+  Right 0.87    0.39    1.01    0.17      +-------+-----------+-----------+------------+------------+  Left  0.45    [place]     0.87    0.24      +-------+-----------+-----------+------------+------------+      Arterial wall calcification precludes accurate ankle pressures and ABIs.  Bilateral ABIs appear decreased compared to prior study on 03/15/2018.    Summary:  Right: Resting right ankle-brachial index indicates mild right lower extremity arterial disease. The right toe-brachial index is abnormal. ABIs are unreliable.    Left: Resting left ankle-brachial index indicates severe left lower extremity arterial disease. The left toe-brachial index is abnormal. ABIs are unreliable.      PLAN:  Daily seated leg exercises discussed and demonstrated.  Ride stationary bike daily that he has.   Based on the patient's vascular studies and examination, pt will return to clinic in33monthswith ABI's.  I advised him and his wife to notify us if he develops concerns re the circulation in his feet or legs.    I discussed in depth with the patient the nature of atherosclerosis, and emphasized the importance of maximal medical management including strict control of blood pressure, blood glucose, and lipid levels, obtaining regular exercise, and continued cessation of smoking.  The patient is aware that without maximal medical management the underlying atherosclerotic disease process will progress, limiting the benefit of any interventions.  The patient was given information about PAD including signs, symptoms, treatment, what symptoms should prompt the patient to seek immediate medical care, and risk reduction measures to take.  Clemon Chambers, RN, MSN, FNP-C [facility] Phone: [phone]  Clinic MD: Donzetta Matters  09/06/18 3:45 PM

## 2018-09-06 NOTE — Patient Instructions (Signed)

## 2018-10-14 ENCOUNTER — Ambulatory Visit (INDEPENDENT_AMBULATORY_CARE_PROVIDER_SITE_OTHER): Payer: Medicare Other | Admitting: Podiatry

## 2018-10-14 DIAGNOSIS — B351 Tinea unguium: Secondary | ICD-10-CM

## 2018-10-14 DIAGNOSIS — M79674 Pain in right toe(s): Secondary | ICD-10-CM | POA: Diagnosis not present

## 2018-10-14 DIAGNOSIS — M79675 Pain in left toe(s): Secondary | ICD-10-CM

## 2018-10-14 DIAGNOSIS — L84 Corns and callosities: Secondary | ICD-10-CM | POA: Diagnosis not present

## 2018-10-14 DIAGNOSIS — E1151 Type 2 diabetes mellitus with diabetic peripheral angiopathy without gangrene: Secondary | ICD-10-CM | POA: Diagnosis not present

## 2018-10-14 NOTE — Patient Instructions (Signed)

## 2018-10-28 ENCOUNTER — Encounter: Payer: Self-pay | Admitting: Podiatry

## 2018-10-28 NOTE — Progress Notes (Signed)
Subjective: Damon Snyder presents today with diabetes and cc of painful, discolored, thick toenails which interfere with daily activities. Pain is aggravated when wearing enclosed shoe gear. Pain is relieved with periodic professional debridement.  He did receive his diabetic shoes in September.  He relates no problems with them.  He states he last saw Dr. Brigitte Pulse about 3 months ago.  He is closely followed by and did see vascular on last month regarding his peripheral arterial disease.  He is to go back in about 6 months provided he does not have any problems before then.  He does have less than 1 block claudication and symptomatic right lower extremity.  Patient relates foot swelling and burning on the bottom of his feet.  States this is been going on for years and usually occurs in the nighttime.  Mayra Neer, MD is his primary care physician.   Current Outpatient Medications:  .  allopurinol (ZYLOPRIM) 100 MG tablet, Take 200 mg by mouth daily as needed. , Disp: , Rfl:  .  amLODipine (NORVASC) 10 MG tablet, Take 10 mg by mouth daily., Disp: , Rfl:  .  aspirin EC 325 MG tablet, Take 1 tablet (325 mg total) by mouth 2 (two) times daily., Disp: 84 tablet, Rfl: 0 .  atorvastatin (LIPITOR) 20 MG tablet, Take 20 mg by mouth daily., Disp: , Rfl:  .  benazepril-hydrochlorthiazide (LOTENSIN HCT) 20-12.5 MG per tablet, Take 1 tablet by mouth daily. , Disp: , Rfl:  .  colchicine 0.6 MG tablet, Take 0.6 mg by mouth every 4 (four) hours as needed (gout). , Disp: , Rfl:  .  diphenhydrAMINE (BENADRYL) 25 mg capsule, Take 25 mg by mouth every 6 (six) hours as needed for itching or allergies., Disp: , Rfl:  .  hydrochlorothiazide (HYDRODIURIL) 25 MG tablet, TAKE 1 TABLET BY MOUTH EVERY DAY IN THE MORNING, Disp: , Rfl: 5 .  LANTUS SOLOSTAR 100 UNIT/ML Solostar Pen, Inject 60 Units into the skin at bedtime. , Disp: , Rfl:  .  Nebivolol HCl (BYSTOLIC) 20 MG TABS, Take 20 mg by mouth daily., Disp: , Rfl:  .   NOVOFINE 32G X 6 MM MISC, , Disp: , Rfl:  .  ONETOUCH VERIO test strip, 1 STRIP TWICE A DAY FOR BLOOD SUGAR DX E11.22 FINGERSTICK, Disp: , Rfl:  .  sodium chloride (OCEAN) 0.65 % SOLN nasal spray, Place 1 spray into both nostrils as needed for congestion., Disp: , Rfl:    No Known Allergies   Objective:  Vascular Examination: Capillary refill time delayed x10 digits Dorsalis pedis pulses nonpalpable bilaterally Posterior tibial pulses nonpalpable bilaterally No digital hair x 10 digits No signs of acute ischemia  Dermatological Examination: Skin with normal turgor texture and tone bilaterally  No open wounds bilaterally  No interdigital macerations noted  Toenails 1-5 b/l discolored, thick, dystrophic with subungual debris and pain with palpation to nailbeds due to thickness of nails.  Hyperkeratotic lesions noted submetatarsal head 5 right foot.  There is no erythema, no edema, no drainage, no flocculence noted.  No impending wound.  Musculoskeletal: Muscle strength 5/5 to all LE muscle groups  Hammertoe deformity bilateral second digit  Neurological: Sensation diminished with 10 gram monofilament. Vibratory sensation diminished.  ABI results December 2019: ABI Findings: +---------+------------------+-----+----------+--------+ Right  Rt Pressure (mmHg)IndexWaveform Comment  +---------+------------------+-----+----------+--------+ Brachial 187           triphasic      +---------+------------------+-----+----------+--------+ PTA   162  0.87 monophasic     +---------+------------------+-----+----------+--------+ DP    123        0.66 monophasic     +---------+------------------+-----+----------+--------+ Great Toe73        0.39 Abnormal      +---------+------------------+-----+----------+--------+  +---------+------------------+-----+----------+----------------------+ Left    Lt Pressure (mmHg)IndexWaveform Comment         +---------+------------------+-----+----------+----------------------+ Brachial 181           triphasic             +---------+------------------+-----+----------+----------------------+ PTA   74        0.40 biphasic             +---------+------------------+-----+----------+----------------------+ PERO   84        0.45 monophasic            +---------+------------------+-----+----------+----------------------+ Great Toe            Abnormal >255; Non-compressible +---------+------------------+-----+----------+----------------------+  +-------+-----------+-----------+------------+------------+ ABI/TBIToday's ABIToday's TBIPrevious ABIPrevious TBI +-------+-----------+-----------+------------+------------+ Right 0.87    0.39    1.01    0.17     +-------+-----------+-----------+------------+------------+ Left  0.45    [place]     0.87    0.24     +-------+-----------+-----------+------------+------------+   Arterial wall calcification precludes accurate ankle pressures and ABIs. Bilateral ABIs appear decreased compared to prior study on 03/15/2018.  Summary: Right: Resting right ankle-brachial index indicates mild right lower extremity arterial disease. The right toe-brachial index is abnormal. ABIs are unreliable.  Left: Resting left ankle-brachial index indicates severe left lower extremity arterial disease. The left toe-brachial index is abnormal. ABIs are unreliable.  Assessment: 1. Painful onychomycosis toenails 1-5 b/l 2. Callus submetatarsal head 5 right foot 3. NIDDM with Peripheral arterial disease  Plan: 1. Toenails 1-5 b/l were debrided in length and girth without iatrogenic bleeding. 2. Regarding his foot pain, this could be some form of neuropathic pain or rest pain as  it only occurs at night when he is in bed.  He is currently not on any medication for neuropathy nor is he on a blood thinner. 3. Patient to continue soft, supportive shoe gear 4. Patient to report any pedal injuries to medical professional  5. Follow up 3 months. Patient/POA to call should there be a concern in the interim.

## 2018-11-27 DIAGNOSIS — I739 Peripheral vascular disease, unspecified: Secondary | ICD-10-CM | POA: Diagnosis not present

## 2018-11-27 DIAGNOSIS — N183 Chronic kidney disease, stage 3 (moderate): Secondary | ICD-10-CM | POA: Diagnosis not present

## 2018-11-27 DIAGNOSIS — E1151 Type 2 diabetes mellitus with diabetic peripheral angiopathy without gangrene: Secondary | ICD-10-CM | POA: Diagnosis not present

## 2018-11-27 DIAGNOSIS — I130 Hypertensive heart and chronic kidney disease with heart failure and stage 1 through stage 4 chronic kidney disease, or unspecified chronic kidney disease: Secondary | ICD-10-CM | POA: Diagnosis not present

## 2018-11-27 DIAGNOSIS — Z794 Long term (current) use of insulin: Secondary | ICD-10-CM | POA: Diagnosis not present

## 2018-11-27 DIAGNOSIS — Z6833 Body mass index (BMI) 33.0-33.9, adult: Secondary | ICD-10-CM | POA: Diagnosis not present

## 2018-11-27 DIAGNOSIS — E782 Mixed hyperlipidemia: Secondary | ICD-10-CM | POA: Diagnosis not present

## 2018-11-27 DIAGNOSIS — E669 Obesity, unspecified: Secondary | ICD-10-CM | POA: Diagnosis not present

## 2018-11-27 DIAGNOSIS — I5030 Unspecified diastolic (congestive) heart failure: Secondary | ICD-10-CM | POA: Diagnosis not present

## 2019-01-13 ENCOUNTER — Encounter: Payer: Self-pay | Admitting: Podiatry

## 2019-01-13 ENCOUNTER — Ambulatory Visit (INDEPENDENT_AMBULATORY_CARE_PROVIDER_SITE_OTHER): Payer: Medicare Other | Admitting: Podiatry

## 2019-01-13 ENCOUNTER — Other Ambulatory Visit: Payer: Self-pay

## 2019-01-13 VITALS — Temp 97.5°F

## 2019-01-13 DIAGNOSIS — L84 Corns and callosities: Secondary | ICD-10-CM

## 2019-01-13 DIAGNOSIS — E1151 Type 2 diabetes mellitus with diabetic peripheral angiopathy without gangrene: Secondary | ICD-10-CM | POA: Diagnosis not present

## 2019-01-13 DIAGNOSIS — B351 Tinea unguium: Secondary | ICD-10-CM | POA: Diagnosis not present

## 2019-01-13 DIAGNOSIS — M79675 Pain in left toe(s): Secondary | ICD-10-CM | POA: Diagnosis not present

## 2019-01-13 DIAGNOSIS — M79674 Pain in right toe(s): Secondary | ICD-10-CM

## 2019-01-13 NOTE — Progress Notes (Signed)
Subjective: Patient presents today for preventative diabetic foot care. He also has h/o PAD. He has thick, elongated, tender mycotic toenails b/l and callus right foot.  He voices no new pedal concerns on today's visit.  Mayra Neer, MD is his PCP.   Current Outpatient Medications:  .  allopurinol (ZYLOPRIM) 100 MG tablet, Take 200 mg by mouth daily as needed. , Disp: , Rfl:  .  amLODipine (NORVASC) 10 MG tablet, Take 10 mg by mouth daily., Disp: , Rfl:  .  aspirin EC 325 MG tablet, Take 1 tablet (325 mg total) by mouth 2 (two) times daily., Disp: 84 tablet, Rfl: 0 .  atorvastatin (LIPITOR) 20 MG tablet, Take 20 mg by mouth daily., Disp: , Rfl:  .  benazepril-hydrochlorthiazide (LOTENSIN HCT) 20-12.5 MG per tablet, Take 1 tablet by mouth daily. , Disp: , Rfl:  .  colchicine 0.6 MG tablet, Take 0.6 mg by mouth every 4 (four) hours as needed (gout). , Disp: , Rfl:  .  diphenhydrAMINE (BENADRYL) 25 mg capsule, Take 25 mg by mouth every 6 (six) hours as needed for itching or allergies., Disp: , Rfl:  .  hydrochlorothiazide (HYDRODIURIL) 25 MG tablet, TAKE 1 TABLET BY MOUTH EVERY DAY IN THE MORNING, Disp: , Rfl: 5 .  LANTUS SOLOSTAR 100 UNIT/ML Solostar Pen, Inject 60 Units into the skin at bedtime. , Disp: , Rfl:  .  Nebivolol HCl (BYSTOLIC) 20 MG TABS, Take 20 mg by mouth daily., Disp: , Rfl:  .  NOVOFINE 32G X 6 MM MISC, , Disp: , Rfl:  .  ONETOUCH VERIO test strip, 1 STRIP TWICE A DAY FOR BLOOD SUGAR DX E11.22 FINGERSTICK, Disp: , Rfl:  .  sodium chloride (OCEAN) 0.65 % SOLN nasal spray, Place 1 spray into both nostrils as needed for congestion., Disp: , Rfl:    No Known Allergies   Objective:  Vascular Examination: Capillary refill time delayed x 10 digits.  Dorsalis pedis pulses nonpalpable b/l.  Posterior tibial pulses nonpalpable b/l.  Digital hair absent x 10 digits.  Skin temperature gradient WNL b/l.  No ischemic changes noted b/l.  Dermatological Examination: Skin  with normal turgor, texture and tone b/l.  Toenails 1-5 b/l discolored, thick, dystrophic with subungual debris and pain with palpation to nailbeds due to thickness of nails.  Hyperkeratotic lesion submet head 5 right foot. No erythema, no edema, no drainage, no flocculence noted.  Musculoskeletal: Muscle strength 5/5 to all LE muscle groups.  Hammertoe deformity b/l 2nd digits.  Neurological: Sensation diminished with 10 gram monofilament.  Vibratory sensation diminished b/l.  Assessment: 1. Painful onychomycosis toenails 1-5 b/l 2. Callus submet head 5 right foot 3. NIDDM with Peripheral arterial disease  Plan: 1. Toenails 1-5 b/l were debrided in length and girth without iatrogenic bleeding. Calluses pared submetatarsal head(s) 5 right foot utilizing sterile scalpel blade without incident. 2. Patient to continue soft, supportive shoe gear daily. 3. Patient to report any pedal injuries to medical professional immediately. 4. Follow up 3 months. 5. Patient/POA to call should there be a concern in the interim.

## 2019-01-14 ENCOUNTER — Ambulatory Visit: Payer: Medicare Other | Admitting: Podiatry

## 2019-03-07 ENCOUNTER — Other Ambulatory Visit: Payer: Self-pay

## 2019-03-07 DIAGNOSIS — I779 Disorder of arteries and arterioles, unspecified: Secondary | ICD-10-CM

## 2019-03-10 ENCOUNTER — Ambulatory Visit (INDEPENDENT_AMBULATORY_CARE_PROVIDER_SITE_OTHER): Payer: Medicare Other | Admitting: Family

## 2019-03-10 ENCOUNTER — Ambulatory Visit (HOSPITAL_COMMUNITY)
Admission: RE | Admit: 2019-03-10 | Discharge: 2019-03-10 | Disposition: A | Discharge status: Home / Self Care | Payer: Medicare Other | Source: Ambulatory Visit | Attending: Vascular Surgery | Admitting: Vascular Surgery

## 2019-03-10 ENCOUNTER — Other Ambulatory Visit: Payer: Self-pay

## 2019-03-10 ENCOUNTER — Encounter: Payer: Self-pay | Admitting: Family

## 2019-03-10 VITALS — BP 157/69 | HR 60 | Temp 97.1°F | Resp 18 | Ht 76.0 in | Wt 237.0 lb

## 2019-03-10 DIAGNOSIS — I779 Disorder of arteries and arterioles, unspecified: Secondary | ICD-10-CM

## 2019-03-10 NOTE — Patient Instructions (Signed)
Peripheral Vascular Disease  Peripheral vascular disease (PVD) is a disease of the blood vessels that are not part of your heart and brain. A simple term for PVD is poor circulation. In most cases, PVD narrows the blood vessels that carry blood from your heart to the rest of your body. This can reduce the supply of blood to your arms, legs, and internal organs, like your stomach or kidneys. However, PVD most often affects a person's lower legs and feet. Without treatment, PVD tends to get worse. PVD can also lead to acute ischemic limb. This is when an arm or leg suddenly cannot get enough blood. This is a medical emergency. Follow these instructions at home: Lifestyle  Do not use any products that contain nicotine or tobacco, such as cigarettes and e-cigarettes. If you need help quitting, ask your doctor.  Lose weight if you are overweight. Or, stay at a healthy weight as told by your doctor.  Eat a diet that is low in fat and cholesterol. If you need help, ask your doctor.  Exercise regularly. Ask your doctor for activities that are right for you. General instructions  Take over-the-counter and prescription medicines only as told by your doctor.  Take good care of your feet: ? Wear comfortable shoes that fit well. ? Check your feet often for any cuts or sores.  Keep all follow-up visits as told by your doctor This is important. Contact a doctor if:  You have cramps in your legs when you walk.  You have leg pain when you are at rest.  You have coldness in a leg or foot.  Your skin changes.  You are unable to get or have an erection (erectile dysfunction).  You have cuts or sores on your feet that do not heal. Get help right away if:  Your arm or leg turns cold, numb, and blue.  Your arms or legs become red, warm, swollen, painful, or numb.  You have chest pain.  You have trouble breathing.  You suddenly have weakness in your face, arm, or leg.  You become very  confused or you cannot speak.  You suddenly have a very bad headache.  You suddenly cannot see. Summary  Peripheral vascular disease (PVD) is a disease of the blood vessels.  A simple term for PVD is poor circulation. Without treatment, PVD tends to get worse.  Treatment may include exercise, low fat and low cholesterol diet, and quitting smoking. This information is not intended to replace advice given to you by your health care provider. Make sure you discuss any questions you have with your health care provider. Document Released: 12/06/2009 Document Revised: 10/19/2016 Document Reviewed: 10/19/2016 Elsevier Interactive Patient Education  2019 [employer]  

## 2019-03-10 NOTE — Progress Notes (Signed)
[facility] [place]   CC: Follow up peripheral artery occlusive disease  History of Present Illness Damon Snyder. is a 85 y.o. male whom Dr. Bridgett Larsson had been monitoring for PAOD.   Pt was evaluated by Dr. Bridgett Larsson and K. Trinh, PA-C, on 08-25-16 preoperatively to left knee replacement. At that time pt had lateral knee pain, left worse than rightand bilateralmoderate PAD The patient's ABIs hadimproved from his studies a year prior.  Hehadmoderate PAD in L leg with strong monophasic flow in his pedal arteries. Dr. Bridgett Larsson indicatedit was reasonable to proceed with L TKR from a vascular viewpoint. Pt was encouragedto continue to stay active using the stationary bike and to optimize his medical conditions. He was tofollow-up in 6 months with ABIs. Pt had the left TKA on 09-14-16.  Pt states he had what sounds like an angioplasty and/or a stent placed in in left leg about 2013 in [place].  Pt denies non healing wounds. He denies rest pain. Heis using hisstationary bike, 30 minutes daily, 5 days/week.  His walking seems limited by heel pain and right knee pain, he also has heel pain at rest, no non healing wounds.  Pt denies any history of stroke or TIA, denies any history of MI or cardiac procedures.  He is seeing [facility] for regular DM foot exams and to trim his thick toenails.    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  Pt meds include: Statin :Yes Betablocker:no ASA:Yes,81mg  daily Other anticoagulants/antiplatelets:no  Past Medical History:  Diagnosis Date  . Anemia    h/o   . Arthritis    "left knee; shoulders" (09/14/2016)  . Colon cancer ([place])     tx : chemo)   . Difficulty in walking(719.7)   . Dyspnea    relative to pain in her leg  . Foot ulcer ([place])   . Gout   . High cholesterol    . History of blood transfusion    "that's when they found out about the colon cancer"  . Hypertension   . Lower GI bleed   . Prostate cancer [facility])     treated with radiation   . Type II diabetes mellitus ([place])     Social History Social History   Tobacco Use  . Smoking status: Never Smoker  . Smokeless tobacco: Never Used  Substance Use Topics  . Alcohol use: No    Alcohol/week: 0.0 standard drinks  . Drug use: No    Family History Family History  Problem Relation Age of Onset  . Hypertension Mother   . Cancer Sister        Ovarian  . Hypertension Father   . Heart disease Brother        After age 63  . Kidney disease Sister     Past Surgical History:  Procedure Laterality Date  . CATARACT EXTRACTION W/ INTRAOCULAR LENS  IMPLANT, BILATERAL Bilateral   . COLECTOMY    . JOINT REPLACEMENT    . PROSTATE BIOPSY    . TOTAL KNEE ARTHROPLASTY Left 09/14/2016  . TOTAL KNEE ARTHROPLASTY Left 09/14/2016   Procedure: LEFT TOTAL KNEE ARTHROPLASTY;  Surgeon: Leandrew Koyanagi, MD;  Location: [facility];  Service: Orthopedics;  Laterality: Left;    No Known Allergies  Current Outpatient Medications  Medication Sig Dispense Refill  . allopurinol (ZYLOPRIM) 100 MG tablet Take 200  mg by mouth daily as needed.     Marland Kitchen amLODipine (NORVASC) 10 MG tablet Take 10 mg by mouth daily.    Marland Kitchen aspirin EC 325 MG tablet Take 1 tablet (325 mg total) by mouth 2 (two) times daily. 84 tablet 0  . atorvastatin (LIPITOR) 20 MG tablet Take 20 mg by mouth daily.    . benazepril-hydrochlorthiazide (LOTENSIN HCT) 20-12.5 MG per tablet Take 1 tablet by mouth daily.     . colchicine 0.6 MG tablet Take 0.6 mg by mouth every 4 (four) hours as needed (gout).     Marland Kitchen diphenhydrAMINE (BENADRYL) 25 mg capsule Take 25 mg by mouth every 6 (six) hours as needed for itching or allergies.    . hydrochlorothiazide (HYDRODIURIL) 25 MG tablet TAKE 1 TABLET BY MOUTH EVERY DAY IN THE MORNING  5  . LANTUS SOLOSTAR 100 UNIT/ML Solostar  Pen Inject 60 Units into the skin at bedtime.     . Nebivolol HCl (BYSTOLIC) 20 MG TABS Take 20 mg by mouth daily.    Marland Kitchen NOVOFINE 32G X 6 MM MISC     . ONETOUCH VERIO test strip 1 STRIP TWICE A DAY FOR BLOOD SUGAR DX E11.22 FINGERSTICK    . sodium chloride (OCEAN) 0.65 % SOLN nasal spray Place 1 spray into both nostrils as needed for congestion.     No current facility-administered medications for this visit.     ROS: See HPI for pertinent positives and negatives.   Physical Examination  Vitals:   03/10/19 1440  BP: (!) 157/69  Pulse: 60  Resp: 18  Temp: (!) 97.1 F (36.2 C)  TempSrc: Temporal  SpO2: 99%  Weight: 237 lb (107.5 kg)  Height: 6\' 4"  (1.93 m)   Body mass index is 28.85 kg/m.  General: A&O x 3, WDWN, elderly male. Gait: slow, shuffling HENT: No gross abnormalities.  Eyes: Pupils are equal Pulmonary: Respirations are non labored, CTAB, good air movement in all fields Cardiac: regular rhythm, no detected murmur.         Carotid Bruits Right Left   positive Negative   Radial pulses are 1+ palpable bilaterally   Adominal aortic pulse is not palpable                         VASCULAR EXAM: Extremities without ischemic changes, without Gangrene; without open wounds. Thick trimmed toenails. Hammertoes both 2nd toes.                                                                                                          LE Pulses Right Left       FEMORAL  not palpable  not palpable        POPLITEAL  not palpable   not palpable       POSTERIOR TIBIAL  not  palpable   not palpable        DORSALIS PEDIS      ANTERIOR TIBIAL not palpable  not palpable    Abdomen: soft, NT, no palpable masses.  Skin: no rashes, no cellulitis, no ulcers noted. Musculoskeletal: no muscle wasting or atrophy. See Extremities.   Neurologic: A&O X 3; appropriate affect, Sensation is normal; MOTOR FUNCTION:  moving all extremities equally, motor strength 5/5 throughout. Speech is  fluent/normal. CN 2-12 intact. Psychiatric: Thought content is normal, mood appropriate for clinical situation.     ASSESSMENT: Damon Snyder. is a 85 y.o. male who has PAOD.  His walking is limited by right knee pain and bilateral heel pain; he also has bilateral heel pain at rest, no signs of ischemia in his feet or legs.   ABI's show calcified vessels in the right with monophasic waveforms bilaterally. Non compressible vessels in the right, moderate disease in the left.   I advised him to discuss his bilateral heel pain with his podiatrist, Dr. Adah Perl.    His DM is in excellent control, he has never used tobacco, but he processed tobacco for several years, tobacco dust was in that environment. He is riding his stationary bile daily and walking as much as he can.  He takes a daily statin and ASA.  Right carotid bruit, remote hx of TIA's per pt, most recent carotid duplex in 2014: Bilateral: 1-39% ICA stenosis. Vertebral artery flow is  antegrade.  Will duplex extracranial carotid arteries on his return in 6 months.    DATA  ABI (Date: 03/10/2019): ABI Findings: +---------+------------------+-----+----------+--------+ Right    Rt Pressure (mmHg)IndexWaveform  Comment  +---------+------------------+-----+----------+--------+ Brachial 185                                       +---------+------------------+-----+----------+--------+ PTA      170               0.92 monophasic         +---------+------------------+-----+----------+--------+ DP                              monophasic         +---------+------------------+-----+----------+--------+ Great Toe103               0.56                    +---------+------------------+-----+----------+--------+  +--------+------------------+-----+----------+-------+ Left    Lt Pressure (mmHg)IndexWaveform  Comment +--------+------------------+-----+----------+-------+ [id number]                                       +--------+------------------+-----+----------+-------+ PTA     105               0.57 monophasic (was bi) +--------+------------------+-----+----------+-------+ DP      95                0.51 monophasic        +--------+------------------+-----+----------+-------+  +-------+-----------+-----------+------------+------------+ ABI/TBIToday's ABIToday's TBIPrevious ABIPrevious TBI +-------+-----------+-----------+------------+------------+ Right  0.92       0.56       0.87        0.39         +-------+-----------+-----------+------------+------------+ Left   0.57       [place]         0.45                   +-------+-----------+-----------+------------+------------+   Arterial wall calcification precludes accurate ankle pressures  and ABIs.   Summary: Right: Resting right ankle-brachial index indicates noncompressible right lower extremity arteries.The right toe-brachial index is abnormal.  Left: Resting left ankle-brachial index indicates moderate left lower extremity arterial disease.   PLAN: Daily seated leg exercises discussed and demonstrated. Continue to ride stationary bike daily. Based on the patient's vascular studies and examination, pt will return to clinic in89monthswith ABI's and carotid duplex. I advised him to notify us if he develops concerns re the circulation in his feet or legs.   I discussed in depth with the patient the nature of atherosclerosis, and emphasized the importance of maximal medical management including strict control of blood pressure, blood glucose, and lipid levels, obtaining regular exercise, and continued cessation of smoking.  The patient is aware that without maximal medical management the underlying atherosclerotic disease process will progress, limiting the benefit of any interventions.  The patient was given information about PAD including signs, symptoms, treatment, what symptoms  should prompt the patient to seek immediate medical care, and risk reduction measures to take.  Clemon Chambers, RN, MSN, FNP-C [facility] Phone: [phone]  Clinic MD: Trula Slade  03/10/19 2:54 PM

## 2019-04-15 ENCOUNTER — Ambulatory Visit: Payer: Medicare Other | Admitting: Podiatry

## 2019-04-17 ENCOUNTER — Ambulatory Visit (INDEPENDENT_AMBULATORY_CARE_PROVIDER_SITE_OTHER): Payer: Medicare Other

## 2019-04-17 ENCOUNTER — Ambulatory Visit (INDEPENDENT_AMBULATORY_CARE_PROVIDER_SITE_OTHER): Payer: Medicare Other | Admitting: Orthopaedic Surgery

## 2019-04-17 ENCOUNTER — Other Ambulatory Visit: Payer: Self-pay

## 2019-04-17 ENCOUNTER — Encounter: Payer: Self-pay | Admitting: Orthopaedic Surgery

## 2019-04-17 DIAGNOSIS — I779 Disorder of arteries and arterioles, unspecified: Secondary | ICD-10-CM | POA: Diagnosis not present

## 2019-04-17 DIAGNOSIS — Z96652 Presence of left artificial knee joint: Secondary | ICD-10-CM

## 2019-04-17 DIAGNOSIS — M1612 Unilateral primary osteoarthritis, left hip: Secondary | ICD-10-CM | POA: Diagnosis not present

## 2019-04-17 NOTE — Progress Notes (Signed)
Office Visit Note   Patient: Damon Snyder.           Date of Birth: 1927/12/24           MRN: [id number] Visit Date: 04/17/2019              Requested by: Mayra Neer, MD [address] [place],  [place] [address] PCP: Mayra Neer, MD   Assessment & Plan: Visit Diagnoses:  1. S/P TKR (total knee replacement), left   2. Unilateral primary osteoarthritis, left hip     Plan: Impression is left hip arthritis contributing to the left knee pain.  We will refer the patient to Dr. Junius Roads for an ultrasound-guided intra-articular cortisone injection to the left hip.  He will follow-up with [place] as needed.  Follow-Up Instructions: Return for with Dr. Junius Roads for left hip cortisone injection.   Orders:  Orders Placed This Encounter  Procedures  . XR Knee 1-2 Views Left  . XR HIP UNILAT W OR W/O PELVIS 2-3 VIEWS LEFT   No orders of the defined types were placed in this encounter.     Procedures: No procedures performed   Clinical Data: No additional findings.   Subjective: Chief Complaint  Patient presents with  . Left Knee - Pain    HPI patient is a pleasant 85 year old gentleman who presents our clinic today with left knee pain.  He is status post left total knee replacement back in 2014.  Doing well until approximately 3 weeks ago.  No specific injury but he does note that he started exercising more.  The pain initially began in the groin and radiated down his medial thigh and into his knee.  He has had associated weakness to the left lower extremity.  Pain is worse with standing as well as ambulation.  He has tried Tylenol Extra Strength with mild to moderate relief of symptoms.  No new numbness, tingling or burning to the left lower extremity.  He does have a history of peripheral neuropathy.  He has been using a walker to help with ambulation over the past week.  Review of Systems as detailed in HPI.  All others reviewed and are negative.   Objective:  Vital Signs: There were no vitals taken for this visit.  Physical Exam well-developed well-nourished gentleman no acute distress.  Alert and oriented x3.  Ortho Exam examination of the left knee shows no effusion.  Range of motion 0 to 115 degrees.  Stable valgus varus stress.  5 out of 5 strength with resisted straight leg raise.  Left hip exam shows a painless logroll with very little rotation.  Negative straight leg raise.  He is neurovascular intact distally.  Specialty Comments:  No specialty comments available.  Imaging: Xr Hip Unilat W Or W/o Pelvis 2-3 Views Left  Result Date: 04/17/2019 X-rays demonstrate mild degenerative changes with paratubular spurring  Xr Knee 1-2 Views Left  Result Date: 04/17/2019 X-rays reveal a well-seated prosthesis without complication    PMFS History: Patient Active Problem List   Diagnosis Date Noted  . Total knee replacement status 09/14/2016  . Primary osteoarthritis of left knee   . Bilateral leg weakness 05/21/2015  . Atherosclerosis of native arteries of extremity with intermittent claudication ([place]) 11/13/2014  . Pain in lower limb 01/09/2014  . Chronic diastolic heart failure ([place]) 09/23/2013  . Near syncope 09/22/2013  . Essential hypertension, benign 09/22/2013  . Type II or unspecified type diabetes mellitus without mention of  complication, uncontrolled 09/22/2013  . Pain in joint, ankle and foot 04/01/2013  . PVD (peripheral vascular disease) ([place]) 12/31/2012  . Onychomycosis due to dermatophyte 12/31/2012   Past Medical History:  Diagnosis Date  . Anemia    h/o   . Arthritis    "left knee; shoulders" (09/14/2016)  . Colon cancer ([place])     tx : chemo)   . Difficulty in walking(719.7)   . Dyspnea    relative to pain in her leg  . Foot ulcer ([place])   . Gout   . High cholesterol   . History of blood transfusion    "that's when they found out about the colon cancer"  . Hypertension   . Lower GI bleed   . Prostate  cancer [facility])     treated with radiation   . Type II diabetes mellitus ([facility])     Family History  Problem Relation Age of Onset  . Hypertension Mother   . Cancer Sister        Ovarian  . Hypertension Father   . Heart disease Brother        After age 34  . Kidney disease Sister     Past Surgical History:  Procedure Laterality Date  . CATARACT EXTRACTION W/ INTRAOCULAR LENS  IMPLANT, BILATERAL Bilateral   . COLECTOMY    . JOINT REPLACEMENT    . PROSTATE BIOPSY    . TOTAL KNEE ARTHROPLASTY Left 09/14/2016  . TOTAL KNEE ARTHROPLASTY Left 09/14/2016   Procedure: LEFT TOTAL KNEE ARTHROPLASTY;  Surgeon: Leandrew Koyanagi, MD;  Location: [facility];  Service: Orthopedics;  Laterality: Left;   Social History   Occupational History  . Not on file  Tobacco Use  . Smoking status: Never Smoker  . Smokeless tobacco: Never Used  Substance and Sexual Activity  . Alcohol use: No    Alcohol/week: 0.0 standard drinks  . Drug use: No  . Sexual activity: Never

## 2019-04-21 ENCOUNTER — Ambulatory Visit (INDEPENDENT_AMBULATORY_CARE_PROVIDER_SITE_OTHER): Payer: Medicare Other | Admitting: Family Medicine

## 2019-04-21 ENCOUNTER — Encounter: Payer: Self-pay | Admitting: Family Medicine

## 2019-04-21 DIAGNOSIS — M1612 Unilateral primary osteoarthritis, left hip: Secondary | ICD-10-CM

## 2019-04-21 MED ORDER — DICLOFENAC SODIUM 1 % TD GEL: 4.0000 g | Freq: Four times a day (QID) | TRANSDERMAL | 6 refills | Status: AC | PRN

## 2019-04-21 NOTE — Progress Notes (Signed)
Subjective: He is here for a planned ultrasound-guided left hip injection.  He has had pain from the posterior lateral hip toward the knee for the past few weeks with no injury.  Since his last visit he has actually made significant progress.  Pain seems to be much better.  Objective: No significant tenderness over the greater trochanter.  He has good range of motion and no pain with passive hip flexion and internal/external rotation.  He is tender to palpation over the rectus femoris.  Impression: Improved left hip pain with underlying arthritis, but pain seems to be more muscular today.  Plan: We will refer him to physical therapy.  Trial of Voltaren gel.  If his pain worsens again he will come back for intra-articular injection.

## 2019-05-03 ENCOUNTER — Emergency Department (HOSPITAL_COMMUNITY): Payer: Medicare Other

## 2019-05-03 ENCOUNTER — Other Ambulatory Visit: Payer: Self-pay

## 2019-05-03 ENCOUNTER — Inpatient Hospital Stay (HOSPITAL_COMMUNITY)
Admission: EM | Admit: 2019-05-03 | Discharge: 2019-05-07 | DRG: 436 | Disposition: A | Discharge status: Home Health | Payer: Medicare Other | Attending: Internal Medicine | Admitting: Internal Medicine

## 2019-05-03 DIAGNOSIS — S79912A Unspecified injury of left hip, initial encounter: Secondary | ICD-10-CM | POA: Diagnosis not present

## 2019-05-03 DIAGNOSIS — R945 Abnormal results of liver function studies: Secondary | ICD-10-CM

## 2019-05-03 DIAGNOSIS — Z85038 Personal history of other malignant neoplasm of large intestine: Secondary | ICD-10-CM

## 2019-05-03 DIAGNOSIS — Z96652 Presence of left artificial knee joint: Secondary | ICD-10-CM | POA: Diagnosis present

## 2019-05-03 DIAGNOSIS — R29898 Other symptoms and signs involving the musculoskeletal system: Secondary | ICD-10-CM | POA: Diagnosis present

## 2019-05-03 DIAGNOSIS — R932 Abnormal findings on diagnostic imaging of liver and biliary tract: Secondary | ICD-10-CM | POA: Diagnosis not present

## 2019-05-03 DIAGNOSIS — Z9221 Personal history of antineoplastic chemotherapy: Secondary | ICD-10-CM

## 2019-05-03 DIAGNOSIS — K828 Other specified diseases of gallbladder: Secondary | ICD-10-CM | POA: Diagnosis not present

## 2019-05-03 DIAGNOSIS — S0990XA Unspecified injury of head, initial encounter: Secondary | ICD-10-CM | POA: Diagnosis not present

## 2019-05-03 DIAGNOSIS — I5032 Chronic diastolic (congestive) heart failure: Secondary | ICD-10-CM | POA: Diagnosis present

## 2019-05-03 DIAGNOSIS — K831 Obstruction of bile duct: Secondary | ICD-10-CM | POA: Diagnosis not present

## 2019-05-03 DIAGNOSIS — E11649 Type 2 diabetes mellitus with hypoglycemia without coma: Secondary | ICD-10-CM | POA: Diagnosis present

## 2019-05-03 DIAGNOSIS — Z9842 Cataract extraction status, left eye: Secondary | ICD-10-CM | POA: Diagnosis not present

## 2019-05-03 DIAGNOSIS — K838 Other specified diseases of biliary tract: Secondary | ICD-10-CM | POA: Diagnosis not present

## 2019-05-03 DIAGNOSIS — M25552 Pain in left hip: Secondary | ICD-10-CM | POA: Diagnosis not present

## 2019-05-03 DIAGNOSIS — Z6827 Body mass index (BMI) 27.0-27.9, adult: Secondary | ICD-10-CM

## 2019-05-03 DIAGNOSIS — N183 Chronic kidney disease, stage 3 (moderate): Secondary | ICD-10-CM | POA: Diagnosis present

## 2019-05-03 DIAGNOSIS — Y92009 Unspecified place in unspecified non-institutional (private) residence as the place of occurrence of the external cause: Secondary | ICD-10-CM

## 2019-05-03 DIAGNOSIS — K869 Disease of pancreas, unspecified: Secondary | ICD-10-CM | POA: Diagnosis not present

## 2019-05-03 DIAGNOSIS — E785 Hyperlipidemia, unspecified: Secondary | ICD-10-CM | POA: Diagnosis present

## 2019-05-03 DIAGNOSIS — M25562 Pain in left knee: Secondary | ICD-10-CM | POA: Diagnosis not present

## 2019-05-03 DIAGNOSIS — D3500 Benign neoplasm of unspecified adrenal gland: Secondary | ICD-10-CM | POA: Diagnosis not present

## 2019-05-03 DIAGNOSIS — R748 Abnormal levels of other serum enzymes: Secondary | ICD-10-CM | POA: Diagnosis not present

## 2019-05-03 DIAGNOSIS — Z961 Presence of intraocular lens: Secondary | ICD-10-CM | POA: Diagnosis present

## 2019-05-03 DIAGNOSIS — E7800 Pure hypercholesterolemia, unspecified: Secondary | ICD-10-CM | POA: Diagnosis present

## 2019-05-03 DIAGNOSIS — S299XXA Unspecified injury of thorax, initial encounter: Secondary | ICD-10-CM | POA: Diagnosis not present

## 2019-05-03 DIAGNOSIS — C7800 Secondary malignant neoplasm of unspecified lung: Secondary | ICD-10-CM | POA: Diagnosis present

## 2019-05-03 DIAGNOSIS — I130 Hypertensive heart and chronic kidney disease with heart failure and stage 1 through stage 4 chronic kidney disease, or unspecified chronic kidney disease: Secondary | ICD-10-CM | POA: Diagnosis not present

## 2019-05-03 DIAGNOSIS — E1151 Type 2 diabetes mellitus with diabetic peripheral angiopathy without gangrene: Secondary | ICD-10-CM | POA: Diagnosis present

## 2019-05-03 DIAGNOSIS — C787 Secondary malignant neoplasm of liver and intrahepatic bile duct: Secondary | ICD-10-CM | POA: Diagnosis present

## 2019-05-03 DIAGNOSIS — C801 Malignant (primary) neoplasm, unspecified: Secondary | ICD-10-CM | POA: Diagnosis not present

## 2019-05-03 DIAGNOSIS — K8051 Calculus of bile duct without cholangitis or cholecystitis with obstruction: Secondary | ICD-10-CM | POA: Diagnosis present

## 2019-05-03 DIAGNOSIS — R7401 Elevation of levels of liver transaminase levels: Secondary | ICD-10-CM

## 2019-05-03 DIAGNOSIS — C250 Malignant neoplasm of head of pancreas: Secondary | ICD-10-CM | POA: Diagnosis not present

## 2019-05-03 DIAGNOSIS — I739 Peripheral vascular disease, unspecified: Secondary | ICD-10-CM | POA: Diagnosis not present

## 2019-05-03 DIAGNOSIS — E119 Type 2 diabetes mellitus without complications: Secondary | ICD-10-CM | POA: Diagnosis not present

## 2019-05-03 DIAGNOSIS — Z841 Family history of disorders of kidney and ureter: Secondary | ICD-10-CM

## 2019-05-03 DIAGNOSIS — R627 Adult failure to thrive: Secondary | ICD-10-CM | POA: Diagnosis present

## 2019-05-03 DIAGNOSIS — D490 Neoplasm of unspecified behavior of digestive system: Secondary | ICD-10-CM | POA: Diagnosis not present

## 2019-05-03 DIAGNOSIS — Z923 Personal history of irradiation: Secondary | ICD-10-CM

## 2019-05-03 DIAGNOSIS — S8992XA Unspecified injury of left lower leg, initial encounter: Secondary | ICD-10-CM | POA: Diagnosis not present

## 2019-05-03 DIAGNOSIS — K7689 Other specified diseases of liver: Secondary | ICD-10-CM | POA: Diagnosis not present

## 2019-05-03 DIAGNOSIS — Z20828 Contact with and (suspected) exposure to other viral communicable diseases: Secondary | ICD-10-CM | POA: Diagnosis present

## 2019-05-03 DIAGNOSIS — Z794 Long term (current) use of insulin: Secondary | ICD-10-CM | POA: Diagnosis not present

## 2019-05-03 DIAGNOSIS — R5381 Other malaise: Secondary | ICD-10-CM | POA: Diagnosis present

## 2019-05-03 DIAGNOSIS — E1122 Type 2 diabetes mellitus with diabetic chronic kidney disease: Secondary | ICD-10-CM | POA: Diagnosis present

## 2019-05-03 DIAGNOSIS — M1612 Unilateral primary osteoarthritis, left hip: Secondary | ICD-10-CM | POA: Diagnosis present

## 2019-05-03 DIAGNOSIS — K8020 Calculus of gallbladder without cholecystitis without obstruction: Secondary | ICD-10-CM | POA: Diagnosis not present

## 2019-05-03 DIAGNOSIS — N179 Acute kidney failure, unspecified: Secondary | ICD-10-CM | POA: Diagnosis present

## 2019-05-03 DIAGNOSIS — K8689 Other specified diseases of pancreas: Secondary | ICD-10-CM | POA: Diagnosis present

## 2019-05-03 DIAGNOSIS — Z8546 Personal history of malignant neoplasm of prostate: Secondary | ICD-10-CM

## 2019-05-03 DIAGNOSIS — Z9841 Cataract extraction status, right eye: Secondary | ICD-10-CM | POA: Diagnosis not present

## 2019-05-03 DIAGNOSIS — R740 Nonspecific elevation of levels of transaminase and lactic acid dehydrogenase [LDH]: Secondary | ICD-10-CM | POA: Diagnosis not present

## 2019-05-03 DIAGNOSIS — N289 Disorder of kidney and ureter, unspecified: Secondary | ICD-10-CM | POA: Diagnosis not present

## 2019-05-03 DIAGNOSIS — Z7982 Long term (current) use of aspirin: Secondary | ICD-10-CM

## 2019-05-03 DIAGNOSIS — Z79899 Other long term (current) drug therapy: Secondary | ICD-10-CM

## 2019-05-03 DIAGNOSIS — W19XXXA Unspecified fall, initial encounter: Secondary | ICD-10-CM | POA: Diagnosis present

## 2019-05-03 DIAGNOSIS — K449 Diaphragmatic hernia without obstruction or gangrene: Secondary | ICD-10-CM | POA: Diagnosis not present

## 2019-05-03 DIAGNOSIS — Z8249 Family history of ischemic heart disease and other diseases of the circulatory system: Secondary | ICD-10-CM

## 2019-05-03 DIAGNOSIS — N281 Cyst of kidney, acquired: Secondary | ICD-10-CM | POA: Diagnosis not present

## 2019-05-03 DIAGNOSIS — I10 Essential (primary) hypertension: Secondary | ICD-10-CM | POA: Diagnosis not present

## 2019-05-03 DIAGNOSIS — R531 Weakness: Secondary | ICD-10-CM | POA: Diagnosis not present

## 2019-05-03 DIAGNOSIS — R160 Hepatomegaly, not elsewhere classified: Secondary | ICD-10-CM | POA: Diagnosis not present

## 2019-05-03 DIAGNOSIS — R7989 Other specified abnormal findings of blood chemistry: Secondary | ICD-10-CM | POA: Diagnosis present

## 2019-05-03 DIAGNOSIS — Z9049 Acquired absence of other specified parts of digestive tract: Secondary | ICD-10-CM

## 2019-05-03 LAB — CBC WITH DIFFERENTIAL/PLATELET
Abs Immature Granulocytes: 0.02 10*3/uL (ref 0.00–0.07)
Basophils Absolute: 0.1 10*3/uL (ref 0.0–0.1)
Basophils Relative: 1 %
Eosinophils Absolute: 0.2 10*3/uL (ref 0.0–0.5)
Eosinophils Relative: 4 %
HCT: 37.7 % — ABNORMAL LOW (ref 39.0–52.0)
Hemoglobin: 12.1 g/dL — ABNORMAL LOW (ref 13.0–17.0)
Immature Granulocytes: 0 %
Lymphocytes Relative: 11 %
Lymphs Abs: 0.7 10*3/uL (ref 0.7–4.0)
MCH: 28.6 pg (ref 26.0–34.0)
MCHC: 32.1 g/dL (ref 30.0–36.0)
MCV: 89.1 fL (ref 80.0–100.0)
Monocytes Absolute: 0.6 10*3/uL (ref 0.1–1.0)
Monocytes Relative: 10 %
Neutro Abs: 4.6 10*3/uL (ref 1.7–7.7)
Neutrophils Relative %: 74 %
Platelets: 196 10*3/uL (ref 150–400)
RBC: 4.23 MIL/uL (ref 4.22–5.81)
RDW: 14.3 % (ref 11.5–15.5)
WBC: 6.2 10*3/uL (ref 4.0–10.5)
nRBC: 0 % (ref 0.0–0.2)

## 2019-05-03 LAB — CBG MONITORING, ED: Glucose-Capillary: 97 mg/dL (ref 70–99)

## 2019-05-03 LAB — HEMOGLOBIN A1C
Hgb A1c MFr Bld: 6 % — ABNORMAL HIGH (ref 4.8–5.6)
Mean Plasma Glucose: 125.5 mg/dL

## 2019-05-03 LAB — COMPREHENSIVE METABOLIC PANEL
ALT: 221 U/L — ABNORMAL HIGH (ref 0–44)
AST: 154 U/L — ABNORMAL HIGH (ref 15–41)
Albumin: 3.4 g/dL — ABNORMAL LOW (ref 3.5–5.0)
Alkaline Phosphatase: 588 U/L — ABNORMAL HIGH (ref 38–126)
Anion gap: 12 (ref 5–15)
BUN: 33 mg/dL — ABNORMAL HIGH (ref 8–23)
CO2: 19 mmol/L — ABNORMAL LOW (ref 22–32)
Calcium: 9.3 mg/dL (ref 8.9–10.3)
Chloride: 106 mmol/L (ref 98–111)
Creatinine, Ser: 2.12 mg/dL — ABNORMAL HIGH (ref 0.61–1.24)
GFR calc Af Amer: 31 mL/min — ABNORMAL LOW (ref >60)
GFR calc non Af Amer: 27 mL/min — ABNORMAL LOW (ref >60)
Glucose, Bld: 106 mg/dL — ABNORMAL HIGH (ref 70–99)
Potassium: 3.4 mmol/L — ABNORMAL LOW (ref 3.5–5.1)
Sodium: 137 mmol/L (ref 135–145)
Total Bilirubin: 5.9 mg/dL — ABNORMAL HIGH (ref 0.3–1.2)
Total Protein: 7.3 g/dL (ref 6.5–8.1)

## 2019-05-03 LAB — ETHANOL: Alcohol, Ethyl (B): <10 mg/dL (ref <10)

## 2019-05-03 LAB — GLUCOSE, CAPILLARY
Glucose-Capillary: 46 mg/dL — ABNORMAL LOW (ref 70–99)
Glucose-Capillary: 84 mg/dL (ref 70–99)

## 2019-05-03 LAB — URINALYSIS, ROUTINE W REFLEX MICROSCOPIC
Bilirubin Urine: NEGATIVE
Glucose, UA: NEGATIVE mg/dL
Ketones, ur: NEGATIVE mg/dL
Leukocytes,Ua: NEGATIVE
Nitrite: NEGATIVE
Protein, ur: 30 mg/dL — AB
Specific Gravity, Urine: 1.013 (ref 1.005–1.030)
pH: 5 (ref 5.0–8.0)

## 2019-05-03 LAB — TROPONIN I (HIGH SENSITIVITY)
Troponin I (High Sensitivity): 30 ng/L — ABNORMAL HIGH (ref <18)
Troponin I (High Sensitivity): 31 ng/L — ABNORMAL HIGH (ref <18)

## 2019-05-03 LAB — ACETAMINOPHEN LEVEL: Acetaminophen (Tylenol), Serum: <10 ug/mL — ABNORMAL LOW (ref 10–30)

## 2019-05-03 LAB — LIPASE, BLOOD: Lipase: 122 U/L — ABNORMAL HIGH (ref 11–51)

## 2019-05-03 LAB — SARS CORONAVIRUS 2 BY RT PCR (HOSPITAL ORDER, PERFORMED IN ~~LOC~~ HOSPITAL LAB): SARS Coronavirus 2: NEGATIVE

## 2019-05-03 LAB — PSA: Prostatic Specific Antigen: 0.35 ng/mL (ref 0.00–4.00)

## 2019-05-03 MED ORDER — HYDROCHLOROTHIAZIDE 25 MG PO TABS: 25.0000 mg | ORAL_TABLET | Freq: Every day | ORAL | Status: DC

## 2019-05-03 MED ORDER — ASPIRIN EC 325 MG PO TBEC
325.0000 mg | DELAYED_RELEASE_TABLET | Freq: Two times a day (BID) | ORAL | Status: DC
  Administered 2019-05-03: 23:00:00 325 mg via ORAL
  Filled 2019-05-03: qty 1

## 2019-05-03 MED ORDER — ONDANSETRON HCL 4 MG/2ML IJ SOLN: 4.0000 mg | Freq: Four times a day (QID) | INTRAMUSCULAR | Status: DC | PRN

## 2019-05-03 MED ORDER — INSULIN GLARGINE 100 UNIT/ML ~~LOC~~ SOLN
30.0000 [IU] | Freq: Every day | SUBCUTANEOUS | Status: DC
  Filled 2019-05-03 (×2): qty 0.3

## 2019-05-03 MED ORDER — SODIUM CHLORIDE 0.9 % IV BOLUS
1000.0000 mL | Freq: Once | INTRAVENOUS | Status: AC
  Administered 2019-05-03: 17:00:00 1000 mL via INTRAVENOUS

## 2019-05-03 MED ORDER — NEBIVOLOL HCL 10 MG PO TABS
20.0000 mg | ORAL_TABLET | Freq: Every day | ORAL | Status: DC
  Administered 2019-05-04 – 2019-05-07 (×4): 20 mg via ORAL
  Filled 2019-05-03 (×4): qty 2

## 2019-05-03 MED ORDER — AMLODIPINE BESYLATE 10 MG PO TABS
10.0000 mg | ORAL_TABLET | Freq: Every day | ORAL | Status: DC
  Administered 2019-05-04 – 2019-05-05 (×2): 10 mg via ORAL
  Filled 2019-05-03 (×2): qty 1

## 2019-05-03 MED ORDER — INSULIN ASPART 100 UNIT/ML ~~LOC~~ SOLN
0.0000 [IU] | Freq: Three times a day (TID) | SUBCUTANEOUS | Status: DC
  Administered 2019-05-05 – 2019-05-07 (×5): 3 [IU] via SUBCUTANEOUS

## 2019-05-03 MED ORDER — ONDANSETRON HCL 4 MG PO TABS: 4.0000 mg | ORAL_TABLET | Freq: Four times a day (QID) | ORAL | Status: DC | PRN

## 2019-05-03 MED ORDER — ATORVASTATIN CALCIUM 10 MG PO TABS
20.0000 mg | ORAL_TABLET | Freq: Every day | ORAL | Status: DC
  Administered 2019-05-05 – 2019-05-07 (×3): 20 mg via ORAL
  Filled 2019-05-03 (×3): qty 2

## 2019-05-03 MED ORDER — SODIUM CHLORIDE 0.9 % IV SOLN
INTRAVENOUS | Status: DC
  Administered 2019-05-03 – 2019-05-04 (×2): via INTRAVENOUS

## 2019-05-03 MED ORDER — ENOXAPARIN SODIUM 40 MG/0.4ML ~~LOC~~ SOLN
40.0000 mg | Freq: Every day | SUBCUTANEOUS | Status: DC
  Administered 2019-05-03 – 2019-05-06 (×4): 40 mg via SUBCUTANEOUS
  Filled 2019-05-03 (×4): qty 0.4

## 2019-05-03 MED ORDER — INSULIN ASPART 100 UNIT/ML ~~LOC~~ SOLN
0.0000 [IU] | Freq: Every day | SUBCUTANEOUS | Status: DC
  Administered 2019-05-05: 22:00:00 3 [IU] via SUBCUTANEOUS

## 2019-05-03 NOTE — ED Notes (Signed)
ED TO INPATIENT HANDOFF REPORT  ED Nurse Name and Phone #:  [phone]  S Name/Age/Gender [name]. 85 y.o. male Room/Bed: 039C/039C  Code Status   Code Status: Prior  Home/SNF/Other Home Patient oriented to: self, place, time and situation Is this baseline? Yes   Triage Complete: Triage complete  Chief Complaint fell hip pain  Triage Note Pt reports weakness tried to reach for something and fell. Pt reports pain to L hip and leg x 3 weeks.    Allergies No Known Allergies  Level of Care/Admitting Diagnosis ED Disposition    ED Disposition Condition [facility] Area: [place] [[id number]]  Level of Care: Med-Surg [16]  Covid Evaluation: Asymptomatic Screening Protocol (No Symptoms)  Diagnosis: Elevated LFTs [376283]  Admitting Physician: Truett Mainland [[id number]]  Attending Physician: Truett Mainland [[id number]]  Estimated length of stay: past midnight tomorrow  Certification:: I certify this patient will need inpatient services for at least 2 midnights  PT Class (Do Not Modify): Inpatient [101]  PT Acc Code (Do Not Modify): Private [1]       B Medical/Surgery History Past Medical History:  Diagnosis Date  . Anemia    h/o   . Arthritis    "left knee; shoulders" (09/14/2016)  . Colon cancer ([place])     tx : chemo)   . Difficulty in walking(719.7)   . Dyspnea    relative to pain in her leg  . Foot ulcer ([place])   . Gout   . High cholesterol   . History of blood transfusion    "that's when they found out about the colon cancer"  . Hypertension   . Lower GI bleed   . Prostate cancer [facility])     treated with radiation   . Type II diabetes mellitus ([place])    Past Surgical History:  Procedure Laterality Date  . CATARACT EXTRACTION W/ INTRAOCULAR LENS  IMPLANT, BILATERAL Bilateral   . COLECTOMY    . JOINT REPLACEMENT    . PROSTATE BIOPSY    . TOTAL KNEE ARTHROPLASTY Left 09/14/2016  . TOTAL KNEE ARTHROPLASTY Left 09/14/2016    Procedure: LEFT TOTAL KNEE ARTHROPLASTY;  Surgeon: Leandrew Koyanagi, MD;  Location: [place];  Service: Orthopedics;  Laterality: Left;     A IV Location/Drains/Wounds Patient Lines/Drains/Airways Status   Active Line/Drains/Airways    Name:   Placement date:   Placement time:   Site:   Days:   Peripheral IV 05/03/19 Right Antecubital   05/03/19    1504    Antecubital   less than 1   Incision (Closed) 09/14/16 Knee Left   09/14/16    1638     961          Intake/Output Last 24 hours No intake or output data in the 24 hours ending 05/03/19 2140  Labs/Imaging Results for orders placed or performed during the hospital encounter of 05/03/19 (from the past 48 hour(s))  POC CBG, ED     Status: None   Collection Time: 05/03/19  1:28 PM  Result Value Ref Range   Glucose-Capillary 97 70 - 99 mg/dL   Comment 1 Notify RN    Comment 2 Document in Chart   Comprehensive metabolic panel     Status: Abnormal   Collection Time: 05/03/19  3:03 PM  Result Value Ref Range   Sodium 137 135 - 145 mmol/L   Potassium 3.4 (L) 3.5 - 5.1 mmol/L  Chloride 106 98 - 111 mmol/L   CO2 19 (L) 22 - 32 mmol/L   Glucose, Bld 106 (H) 70 - 99 mg/dL   BUN 33 (H) 8 - 23 mg/dL   Creatinine, Ser 2.12 (H) 0.61 - 1.24 mg/dL   Calcium 9.3 8.9 - 10.3 mg/dL   Total Protein 7.3 6.5 - 8.1 g/dL   Albumin 3.4 (L) 3.5 - 5.0 g/dL   AST 154 (H) 15 - 41 U/L   ALT 221 (H) 0 - 44 U/L   Alkaline Phosphatase 588 (H) 38 - 126 U/L   Total Bilirubin 5.9 (H) 0.3 - 1.2 mg/dL   GFR calc non Af Amer 27 (L) >60 mL/min   GFR calc Af Amer 31 (L) >60 mL/min   Anion gap 12 5 - 15    Comment: Performed at [facility] [address] [place], [place] [address]  CBC with Differential     Status: Abnormal   Collection Time: 05/03/19  3:03 PM  Result Value Ref Range   WBC 6.2 4.0 - 10.5 K/uL   RBC 4.23 4.22 - 5.81 MIL/uL   Hemoglobin 12.1 (L) 13.0 - 17.0 g/dL   HCT 37.7 (L) 39.0 - 52.0 %   MCV 89.1 80.0 - 100.0 fL   MCH 28.6 26.0 -  34.0 pg   MCHC 32.1 30.0 - 36.0 g/dL   RDW 14.3 11.5 - 15.5 %   Platelets 196 150 - 400 K/uL   nRBC 0.0 0.0 - 0.2 %   Neutrophils Relative % 74 %   Neutro Abs 4.6 1.7 - 7.7 K/uL   Lymphocytes Relative 11 %   Lymphs Abs 0.7 0.7 - 4.0 K/uL   Monocytes Relative 10 %   Monocytes Absolute 0.6 0.1 - 1.0 K/uL   Eosinophils Relative 4 %   Eosinophils Absolute 0.2 0.0 - 0.5 K/uL   Basophils Relative 1 %   Basophils Absolute 0.1 0.0 - 0.1 K/uL   Immature Granulocytes 0 %   Abs Immature Granulocytes 0.02 0.00 - 0.07 K/uL    Comment: Performed at [facility] [address] [place], [place] [address]  Troponin I (High Sensitivity)     Status: Abnormal   Collection Time: 05/03/19  3:03 PM  Result Value Ref Range   Troponin I (High Sensitivity) 30 (H) <18 ng/L    Comment: (NOTE) Elevated high sensitivity troponin I (hsTnI) values and significant  changes across serial measurements may suggest ACS but many other  chronic and acute conditions are known to elevate hsTnI results.  Refer to the "Links" section for chest pain algorithms and additional  guidance. Performed at [facility], [place] [address] [place], [place] [address]   Acetaminophen level     Status: Abnormal   Collection Time: 05/03/19  3:03 PM  Result Value Ref Range   Acetaminophen (Tylenol), Serum <10 (L) 10 - 30 ug/mL    Comment: (NOTE) Therapeutic concentrations vary significantly. A range of 10-30 ug/mL  may be an effective concentration for many patients. However, some  are best treated at concentrations outside of this range. Acetaminophen concentrations >150 ug/mL at 4 hours after ingestion  and >50 ug/mL at 12 hours after ingestion are often associated with  toxic reactions. Performed at [facility], [place] [address] [place], [place] [address]   Ethanol     Status: None   Collection Time: 05/03/19  3:03 PM  Result Value Ref Range   Alcohol, Ethyl (B) <  10 <10 mg/dL    Comment: (NOTE) Lowest  detectable limit for serum alcohol is 10 mg/dL. For medical purposes only. Performed at [facility], [place] [address], [place], [place] [address]   Urinalysis, Routine w reflex microscopic     Status: Abnormal   Collection Time: 05/03/19  4:09 PM  Result Value Ref Range   Color, Urine AMBER (A) YELLOW    Comment: BIOCHEMICALS MAY BE AFFECTED BY COLOR   APPearance HAZY (A) CLEAR   Specific Gravity, Urine 1.013 1.005 - 1.030   pH 5.0 5.0 - 8.0   Glucose, UA NEGATIVE NEGATIVE mg/dL   Hgb urine dipstick SMALL (A) NEGATIVE   Bilirubin Urine NEGATIVE NEGATIVE   Ketones, ur NEGATIVE NEGATIVE mg/dL   Protein, ur 30 (A) NEGATIVE mg/dL   Nitrite NEGATIVE NEGATIVE   Leukocytes,Ua NEGATIVE NEGATIVE   RBC / HPF 0-5 0 - 5 RBC/hpf   WBC, UA 6-10 0 - 5 WBC/hpf   Bacteria, UA RARE (A) NONE SEEN   Squamous Epithelial / LPF 0-5 0 - 5   Mucus PRESENT     Comment: Performed at [facility], [address], [place], [place] [address]  Troponin I (High Sensitivity)     Status: Abnormal   Collection Time: 05/03/19  5:26 PM  Result Value Ref Range   Troponin I (High Sensitivity) 31 (H) <18 ng/L    Comment: (NOTE) Elevated high sensitivity troponin I (hsTnI) values and significant  changes across serial measurements may suggest ACS but many other  chronic and acute conditions are known to elevate hsTnI results.  Refer to the "Links" section for chest pain algorithms and additional  guidance. Performed at [facility], [place] [address] [place], [place] [address]   Lipase, blood     Status: Abnormal   Collection Time: 05/03/19  5:26 PM  Result Value Ref Range   Lipase 122 (H) 11 - 51 U/L    Comment: Performed at [facility], [place] [address] [place], [place] [address]  SARS Coronavirus 2 [facility] order, Performed in [facility] hospital lab) Nasopharyngeal Nasopharyngeal Swab     Status: None   Collection Time: 05/03/19  6:36 PM   Specimen: Nasopharyngeal Swab  Result Value  Ref Range   SARS Coronavirus 2 NEGATIVE NEGATIVE    Comment: (NOTE) If result is NEGATIVE SARS-CoV-2 target nucleic acids are NOT DETECTED. The SARS-CoV-2 RNA is generally detectable in upper and lower  respiratory specimens during the acute phase of infection. The lowest  concentration of SARS-CoV-2 viral copies this assay can detect is 250  copies / mL. A negative result does not preclude SARS-CoV-2 infection  and should not be used as the sole basis for treatment or other  patient management decisions.  A negative result may occur with  improper specimen collection / handling, submission of specimen other  than nasopharyngeal swab, presence of viral mutation(s) within the  areas targeted by this assay, and inadequate number of viral copies  (<250 copies / mL). A negative result must be combined with clinical  observations, patient history, and epidemiological information. If result is POSITIVE SARS-CoV-2 target nucleic acids are DETECTED. The SARS-CoV-2 RNA is generally detectable in upper and lower  respiratory specimens dur ing the acute phase of infection.  Positive  results are indicative of active infection with SARS-CoV-2.  Clinical  correlation with patient history and other diagnostic information is  necessary to determine patient infection status.  Positive results do  not rule out bacterial infection or co-infection with other viruses. If result is PRESUMPTIVE POSTIVE SARS-CoV-2 nucleic acids MAY BE PRESENT.   A presumptive positive result was obtained on the submitted specimen  and confirmed on repeat testing.  While 2019 novel coronavirus  (SARS-CoV-2) nucleic acids may be present in the submitted sample  additional confirmatory testing may be necessary for epidemiological  and / or clinical management purposes  to differentiate between  SARS-CoV-2 and other Sarbecovirus currently known to infect humans.  If clinically indicated additional testing with an alternate  test  methodology [phone]) is advised. The SARS-CoV-2 RNA is generally  detectable in upper and lower respiratory sp ecimens during the acute  phase of infection. The expected result is Negative. Fact Sheet for Patients:  StrictlyIdeas.no Fact Sheet for Healthcare Providers: BankingDealers.co.za This test is not yet approved or cleared by the [place] FDA and has been authorized for detection and/or diagnosis of SARS-CoV-2 by FDA under an Emergency Use Authorization (EUA).  This EUA will remain in effect (meaning this test can be used) for the duration of the COVID-19 declaration under Section 564(b)(1) of the Act, 21 U.S.C. section 360bbb-3(b)(1), unless the authorization is terminated or revoked sooner. Performed at [facility], [place] [address], [place], [place] [address]   PSA     Status: None   Collection Time: 05/03/19  8:31 PM  Result Value Ref Range   Prostatic Specific Antigen 0.35 0.00 - 4.00 ng/mL    Comment: (NOTE) While PSA levels of <=4.0 ng/ml are reported as reference range, some men with levels below 4.0 ng/ml can have prostate cancer and many men with PSA above 4.0 ng/ml do not have prostate cancer.  Other tests such as free PSA, age specific reference ranges, PSA velocity and PSA doubling time may be helpful especially in men less than 83 years old. Performed at [facility], [place] [address] [place], [place] [address]    Ct Abdomen Pelvis Wo Contrast  Result Date: 05/03/2019 CLINICAL DATA:  Transaminitis EXAM: CT ABDOMEN AND PELVIS WITHOUT CONTRAST TECHNIQUE: Multidetector CT imaging of the abdomen and pelvis was performed following the standard protocol without IV contrast. COMPARISON:  None. FINDINGS: Lower chest: There is a partially visualized 6 mm pulmonary nodule in the left lower lobe (axial series 3, image 1). There is an additional opacity in the left lower lobe measuring approximately 7 mm  (axial series 3, image 9). There is mucous plugging and bronchiectasis involving the medial right lung base.The heart size is normal. Hepatobiliary: There are multiple indeterminate hypoattenuating masses in the liver most notably within the right hepatic lobe. These measure up to approximately 1.6 cm. Multiple gallstones versus gallbladder sludge is noted. The common bile duct is mildly dilated measuring up to approximately 9 mm proximally. There appears to be some intrahepatic biliary ductal dilatation. Pancreas: There is an ill-defined soft tissue density at the pancreatic head. This is not well characterized in the absence of IV contrast. This measures approximately 3.5 x 3.8 cm (axial series 3, image 30). Spleen: No splenic laceration or hematoma. Adrenals/Urinary Tract: --Adrenal glands: No adrenal hemorrhage. --Right kidney/ureter: Multiple right-sided renal cysts are noted. There is no hydronephrosis. --Left kidney/ureter: No hydronephrosis or perinephric hematoma. --Urinary bladder: Unremarkable. Stomach/Bowel: --Stomach/Duodenum: There is a small hiatal hernia. --Small bowel: No dilatation or inflammation. --Colon: The patient is status post partial colectomy. A surgical anastomosis is noted at the level of the hepatic flexure. There is no evidence for an  obstruction. --Appendix: The appendix is likely surgically absent. Vascular/Lymphatic: Atherosclerotic calcification is present within the non-aneurysmal abdominal aorta, without hemodynamically significant stenosis. --No retroperitoneal lymphadenopathy. --No mesenteric lymphadenopathy. --No pelvic or inguinal lymphadenopathy. Reproductive: Unremarkable Other: No ascites or free air. There is a right fat containing inguinal hernia. Musculoskeletal. There is a somewhat irregular appearance of the inferior endplate of the L3 vertebral body. This is favored to represent an atypical Schmorl's node. Degenerative changes are noted throughout the visualized  lumbar spine. There is grade 1 anterolisthesis of L4 on L5. There is no acute osseous abnormality. IMPRESSION: 1. Distended gallbladder with evidence of gallbladder sludge versus cholelithiasis. There is probable intrahepatic and extrahepatic biliary ductal dilatation which is not well evaluated in the absence of IV contrast. If there is clinical concern for an obstructing process such as choledocholithiasis, follow-up with MRCP/ERCP is recommended. There is no CT evidence for acute cholecystitis. 2. Multiple small hypoattenuating masses throughout the liver as detailed above. These are incompletely characterized on this exam. Follow-up with a contrast-enhanced MRI is recommended. 3. Somewhat ill-defined soft tissue density at the level of the pancreatic head. This is not well evaluated in the absence of IV contrast. Follow-up with a contrast-enhanced MRI is recommended. 4. Status post partial colectomy.  No evidence of an obstruction. 5. Multiple pulmonary nodules in the left lower lobe measuring up to approximately 7 mm. Non-contrast chest CT at 3-6 months is recommended. If the nodules are stable at time of repeat CT, then future CT at 18-24 months (from today's scan) is considered optional for low-risk patients, but is recommended for high-risk patients. This recommendation follows the consensus statement: Guidelines for Management of Incidental Pulmonary Nodules Detected on CT Images: From the Fleischner Society 2017; Radiology 2017; 284:228-243. Electronically Signed   By: Constance Holster M.D.   On: 05/03/2019 18:27   Dg Chest 2 View  Result Date: 05/03/2019 CLINICAL DATA:  Fall. EXAM: CHEST - 2 VIEW COMPARISON:  August 13, 2015 FINDINGS: The heart size and mediastinal contours are within normal limits. Both lungs are clear. The visualized skeletal structures are unremarkable. IMPRESSION: No active cardiopulmonary disease. Electronically Signed   By: Dorise Bullion III M.D   On: 05/03/2019 14:13    Ct Head Wo Contrast  Result Date: 05/03/2019 CLINICAL DATA:  Weakness with recent fall.  No known head injury. EXAM: CT HEAD WITHOUT CONTRAST TECHNIQUE: Contiguous axial images were obtained from the base of the skull through the vertex without intravenous contrast. COMPARISON:  MRI brain 09/22/2013. FINDINGS: Brain: There is no evidence of acute intracranial hemorrhage, mass lesion, brain edema or extra-axial fluid collection. There is stable atrophy with mild prominence of the ventricles and subarachnoid spaces. There are mild chronic small vessel ischemic changes within the periventricular white matter which are similar to previous MRI. There is no CT evidence of acute cortical infarction. Vascular: Prominent intracranial vascular calcifications. No hyperdense vessel identified. Skull: Negative for fracture or focal lesion. Sinuses/Orbits: The visualized paranasal sinuses and mastoid air cells are clear. No orbital abnormalities are seen. Other: None. IMPRESSION: Stable atrophy and mild chronic small vessel ischemic changes. No acute intracranial findings. Electronically Signed   By: Richardean Sale M.D.   On: 05/03/2019 15:58   Dg Knee Complete 4 Views Left  Result Date: 05/03/2019 CLINICAL DATA:  Pain after fall EXAM: LEFT KNEE - COMPLETE 4+ VIEW COMPARISON:  None. FINDINGS: Patient is status post left knee replacement. Hardware is in good position. No fracture or dislocation identified. No effusions.  Vascular calcifications noted. IMPRESSION: No acute abnormalities. Electronically Signed   By: Dorise Bullion III M.D   On: 05/03/2019 14:14   Dg Hip Unilat W Or Wo Pelvis 2-3 Views Left  Result Date: 05/03/2019 CLINICAL DATA:  Left hip pain post fall. EXAM: DG HIP (WITH OR WITHOUT PELVIS) 2-3V LEFT COMPARISON:  None. FINDINGS: There is no evidence of hip fracture or dislocation. Mild osteoarthritic changes of bilateral hips. Vascular calcifications noted. IMPRESSION: No acute fracture or dislocation  identified about the left hip. Electronically Signed   By: Fidela Salisbury M.D.   On: 05/03/2019 14:14    Pending Labs Unresulted Labs (From admission, onward)    Start     Ordered   05/03/19 1615  Hepatitis panel, acute  ONCE - STAT,   STAT     05/03/19 1614   Signed and Held  Hemoglobin A1c  Once,   R    Comments: To assess prior glycemic control    Signed and Held   Signed and Held  CBC  Tomorrow morning,   R     Signed and Held   Signed and Held  Comprehensive metabolic panel  Tomorrow morning,   R     Signed and Held   Signed and Held  Lipase, blood  Tomorrow morning,   R     Signed and Held          Vitals/Pain Today's Vitals   05/03/19 1943 05/03/19 2000 05/03/19 2030 05/03/19 2059  BP:  (!) 160/71 (!) 153/64   Pulse:  62 63   Resp:  15 14   Temp:      TempSrc:      SpO2:  100% 100%   PainSc: 3    3     Isolation Precautions No active isolations  Medications Medications  sodium chloride 0.9 % bolus 1,000 mL (0 mLs Intravenous Stopped 05/03/19 1935)    Mobility walks with device High fall risk   Focused Assessments Cardiac Assessment Handoff:  Cardiac Rhythm: Normal sinus rhythm Lab Results  Component Value Date   TROPONINI <0.30 09/23/2013   No results found for: DDIMER Does the Patient currently have chest pain? No     R Recommendations: See Admitting Provider Note  Report given to:   Additional Notes:

## 2019-05-03 NOTE — H&P (Addendum)
History and Physical  [name]. OBS:[id number] DOB: [birth year] DOA: 05/03/2019  Referring physician: Delia Heady, PA-C, ED provider PCP: Mayra Neer, MD  Outpatient Specialists:   Patient Coming From: home  Chief Complaint: left hip and knee pain, fall  HPI: Callaway Hailes. is a 85 y.o. male with a history of arthritis, colon cancer status post chemo, prostate cancer status post radiation, hypertension, type 2 diabetes on insulin.  Patient seen for weakness in the legs that is been worse over the past couple of months.  Patient has had a lack of appetite with approximately 30 pound weight loss over the past couple of months.  Patient states that he has lost his sense of taste.  No palliating or provoking factors.  Patient felt today hitting his left hip and knee.  No radiation of the pain.  Patient denies abdominal pain.  Emergency Department Course: Patient has globally elevated LFTs. Shows no acute cholecystitis, but does show soft tissue density at the level of the pancreatic head and pulmonary nodules.  Review of Systems:   Pt denies any fevers, chills, nausea, vomiting, diarrhea, constipation, abdominal pain, shortness of breath, dyspnea on exertion, orthopnea, cough, wheezing, palpitations, headache, vision changes, lightheadedness, dizziness, melena, rectal bleeding.  Review of systems are otherwise negative  Past Medical History:  Diagnosis Date   Anemia    h/o    Arthritis    "left knee; shoulders" (09/14/2016)   Colon cancer (HCC)     tx : chemo)    Difficulty in walking(719.7)    Dyspnea    relative to pain in her leg   Foot ulcer ([place])    Gout    High cholesterol    History of blood transfusion    "that's when they found out about the colon cancer"   Hypertension    Lower GI bleed    Prostate cancer ([place])     treated with radiation    Type II diabetes mellitus ([place])    Past Surgical History:  Procedure Laterality Date   CATARACT  EXTRACTION W/ INTRAOCULAR LENS  IMPLANT, BILATERAL Bilateral    COLECTOMY     JOINT REPLACEMENT     PROSTATE BIOPSY     TOTAL KNEE ARTHROPLASTY Left 09/14/2016   TOTAL KNEE ARTHROPLASTY Left 09/14/2016   Procedure: LEFT TOTAL KNEE ARTHROPLASTY;  Surgeon: Leandrew Koyanagi, MD;  Location: [facility];  Service: Orthopedics;  Laterality: Left;   Social History:  reports that he has never smoked. He has never used smokeless tobacco. He reports that he does not drink alcohol or use drugs. Patient lives at home  No Known Allergies  Family History  Problem Relation Age of Onset   Hypertension Mother    Cancer Sister        Ovarian   Hypertension Father    Heart disease Brother        After age 5   Kidney disease Sister       Prior to Admission medications   Medication Sig Start Date End Date Taking? Authorizing Provider  allopurinol (ZYLOPRIM) 100 MG tablet Take 200 mg by mouth daily as needed.     [provider]  amLODipine (NORVASC) 10 MG tablet Take 10 mg by mouth daily.    [provider]  aspirin EC 325 MG tablet Take 1 tablet (325 mg total) by mouth 2 (two) times daily. 09/14/16   Leandrew Koyanagi, MD  atorvastatin (LIPITOR) 20 MG tablet Take 20 mg  by mouth daily.    [provider]  benazepril-hydrochlorthiazide (LOTENSIN HCT) 20-12.5 MG per tablet Take 1 tablet by mouth daily.     [provider]  colchicine 0.6 MG tablet Take 0.6 mg by mouth every 4 (four) hours as needed (gout).     [provider]  diclofenac sodium (VOLTAREN) 1 % GEL Apply 4 g topically 4 (four) times daily as needed. 04/21/19   Hilts, Legrand Como, MD  diphenhydrAMINE (BENADRYL) 25 mg capsule Take 25 mg by mouth every 6 (six) hours as needed for itching or allergies.    [provider]  hydrochlorothiazide (HYDRODIURIL) 25 MG tablet TAKE 1 TABLET BY MOUTH EVERY DAY IN THE MORNING 02/12/18   [provider]  LANTUS SOLOSTAR 100 UNIT/ML Solostar Pen Inject  60 Units into the skin at bedtime.  11/17/14   [provider]  Nebivolol HCl (BYSTOLIC) 20 MG TABS Take 20 mg by mouth daily.    [provider]  NOVOFINE 32G X 6 MM MISC  03/11/13   [provider]  [facility] VERIO test strip 1 STRIP TWICE A DAY FOR BLOOD SUGAR DX E11.22 FINGERSTICK 09/09/18   [provider]  sodium chloride (OCEAN) 0.65 % SOLN nasal spray Place 1 spray into both nostrils as needed for congestion.    [provider]    Physical Exam: BP (!) 166/79    Pulse 60    Temp 98.1 F (36.7 C) (Oral)    Resp 17    SpO2 100%    General: Elderly black male. Awake and alert and oriented x3. No acute cardiopulmonary distress.   HEENT: Normocephalic atraumatic.  Right and left ears normal in appearance.  Pupils equal, round, reactive to light. Extraocular muscles are intact. Sclerae anicteric and noninjected.  Moist mucosal membranes. No mucosal lesions.   Neck: Neck supple without lymphadenopathy. No carotid bruits. No masses palpated.   Cardiovascular: Regular rate with normal S1-S2 sounds. No murmurs, rubs, gallops auscultated. No JVD.   Respiratory: Good respiratory effort with no wheezes, rales, rhonchi. Lungs clear to auscultation bilaterally.  No accessory muscle use.  Abdomen: Soft, nontender, nondistended. Active bowel sounds. No masses or hepatosplenomegaly   Skin: No rashes, lesions, or ulcerations.  Dry, warm to touch. 2+ dorsalis pedis and radial pulses.  Musculoskeletal: No calf or leg pain. All major joints not erythematous nontender.  No upper or lower joint deformation.  Good ROM.  No contractures   Psychiatric: Intact judgment and insight. Pleasant and cooperative.  Neurologic: No focal neurological deficits. Strength is 5/5 and symmetric in upper and lower extremities.  Cranial nerves II through XII are grossly intact.           Labs on Admission: I have personally reviewed following labs and imaging  studies  CBC: Recent Labs  Lab 05/03/19 1503  WBC 6.2  NEUTROABS 4.6  HGB 12.1*  HCT 37.7*  MCV 89.1  PLT 416   Basic Metabolic Panel: Recent Labs  Lab 05/03/19 1503  NA 137  K 3.4*  CL 106  CO2 19*  GLUCOSE 106*  BUN 33*  CREATININE 2.12*  CALCIUM 9.3   GFR: CrCl cannot be calculated (Unknown ideal weight.). Liver Function Tests: Recent Labs  Lab 05/03/19 1503  AST 154*  ALT 221*  ALKPHOS 588*  BILITOT 5.9*  PROT 7.3  ALBUMIN 3.4*   No results for input(s): LIPASE, AMYLASE in the last 168 hours. No results for input(s): AMMONIA in the last 168 hours. Coagulation  Profile: No results for input(s): INR, PROTIME in the last 168 hours. Cardiac Enzymes: No results for input(s): CKTOTAL, CKMB, CKMBINDEX, TROPONINI in the last 168 hours. BNP (last 3 results) No results for input(s): PROBNP in the last 8760 hours. HbA1C: No results for input(s): HGBA1C in the last 72 hours. CBG: Recent Labs  Lab 05/03/19 1328  GLUCAP 97   Lipid Profile: No results for input(s): CHOL, HDL, LDLCALC, TRIG, CHOLHDL, LDLDIRECT in the last 72 hours. Thyroid Function Tests: No results for input(s): TSH, T4TOTAL, FREET4, T3FREE, THYROIDAB in the last 72 hours. Anemia Panel: No results for input(s): VITAMINB12, FOLATE, FERRITIN, TIBC, IRON, RETICCTPCT in the last 72 hours. Urine analysis:    Component Value Date/Time   COLORURINE AMBER (A) 05/03/2019 1609   APPEARANCEUR HAZY (A) 05/03/2019 1609   LABSPEC 1.013 05/03/2019 1609   PHURINE 5.0 05/03/2019 1609   GLUCOSEU NEGATIVE 05/03/2019 1609   HGBUR SMALL (A) 05/03/2019 1609   BILIRUBINUR NEGATIVE 05/03/2019 1609   KETONESUR NEGATIVE 05/03/2019 1609   PROTEINUR 30 (A) 05/03/2019 1609   UROBILINOGEN 0.2 09/22/2013 1519   NITRITE NEGATIVE 05/03/2019 1609   LEUKOCYTESUR NEGATIVE 05/03/2019 1609   Sepsis Labs: @LABRCNTIP (procalcitonin:4,lacticidven:4) )No results found for this or any previous visit (from the past 240  hour(s)).   Radiological Exams on Admission: Ct Abdomen Pelvis Wo Contrast  Result Date: 05/03/2019 CLINICAL DATA:  Transaminitis EXAM: CT ABDOMEN AND PELVIS WITHOUT CONTRAST TECHNIQUE: Multidetector CT imaging of the abdomen and pelvis was performed following the standard protocol without IV contrast. COMPARISON:  None. FINDINGS: Lower chest: There is a partially visualized 6 mm pulmonary nodule in the left lower lobe (axial series 3, image 1). There is an additional opacity in the left lower lobe measuring approximately 7 mm (axial series 3, image 9). There is mucous plugging and bronchiectasis involving the medial right lung base.The heart size is normal. Hepatobiliary: There are multiple indeterminate hypoattenuating masses in the liver most notably within the right hepatic lobe. These measure up to approximately 1.6 cm. Multiple gallstones versus gallbladder sludge is noted. The common bile duct is mildly dilated measuring up to approximately 9 mm proximally. There appears to be some intrahepatic biliary ductal dilatation. Pancreas: There is an ill-defined soft tissue density at the pancreatic head. This is not well characterized in the absence of IV contrast. This measures approximately 3.5 x 3.8 cm (axial series 3, image 30). Spleen: No splenic laceration or hematoma. Adrenals/Urinary Tract: --Adrenal glands: No adrenal hemorrhage. --Right kidney/ureter: Multiple right-sided renal cysts are noted. There is no hydronephrosis. --Left kidney/ureter: No hydronephrosis or perinephric hematoma. --Urinary bladder: Unremarkable. Stomach/Bowel: --Stomach/Duodenum: There is a small hiatal hernia. --Small bowel: No dilatation or inflammation. --Colon: The patient is status post partial colectomy. A surgical anastomosis is noted at the level of the hepatic flexure. There is no evidence for an obstruction. --Appendix: The appendix is likely surgically absent. Vascular/Lymphatic: Atherosclerotic calcification is  present within the non-aneurysmal abdominal aorta, without hemodynamically significant stenosis. --No retroperitoneal lymphadenopathy. --No mesenteric lymphadenopathy. --No pelvic or inguinal lymphadenopathy. Reproductive: Unremarkable Other: No ascites or free air. There is a right fat containing inguinal hernia. Musculoskeletal. There is a somewhat irregular appearance of the inferior endplate of the L3 vertebral body. This is favored to represent an atypical Schmorl's node. Degenerative changes are noted throughout the visualized lumbar spine. There is grade 1 anterolisthesis of L4 on L5. There is no acute osseous abnormality. IMPRESSION: 1. Distended gallbladder with evidence of gallbladder sludge versus cholelithiasis. There is probable  intrahepatic and extrahepatic biliary ductal dilatation which is not well evaluated in the absence of IV contrast. If there is clinical concern for an obstructing process such as choledocholithiasis, follow-up with MRCP/ERCP is recommended. There is no CT evidence for acute cholecystitis. 2. Multiple small hypoattenuating masses throughout the liver as detailed above. These are incompletely characterized on this exam. Follow-up with a contrast-enhanced MRI is recommended. 3. Somewhat ill-defined soft tissue density at the level of the pancreatic head. This is not well evaluated in the absence of IV contrast. Follow-up with a contrast-enhanced MRI is recommended. 4. Status post partial colectomy.  No evidence of an obstruction. 5. Multiple pulmonary nodules in the left lower lobe measuring up to approximately 7 mm. Non-contrast chest CT at 3-6 months is recommended. If the nodules are stable at time of repeat CT, then future CT at 18-24 months (from today's scan) is considered optional for low-risk patients, but is recommended for high-risk patients. This recommendation follows the consensus statement: Guidelines for Management of Incidental Pulmonary Nodules Detected on CT  Images: From the Fleischner Society 2017; Radiology 2017; [id number]. Electronically Signed   By: Constance Holster M.D.   On: 05/03/2019 18:27   Dg Chest 2 View  Result Date: 05/03/2019 CLINICAL DATA:  Fall. EXAM: CHEST - 2 VIEW COMPARISON:  August 13, 2015 FINDINGS: The heart size and mediastinal contours are within normal limits. Both lungs are clear. The visualized skeletal structures are unremarkable. IMPRESSION: No active cardiopulmonary disease. Electronically Signed   By: Dorise Bullion III M.D   On: 05/03/2019 14:13   Ct Head Wo Contrast  Result Date: 05/03/2019 CLINICAL DATA:  Weakness with recent fall.  No known head injury. EXAM: CT HEAD WITHOUT CONTRAST TECHNIQUE: Contiguous axial images were obtained from the base of the skull through the vertex without intravenous contrast. COMPARISON:  MRI brain 09/22/2013. FINDINGS: Brain: There is no evidence of acute intracranial hemorrhage, mass lesion, brain edema or extra-axial fluid collection. There is stable atrophy with mild prominence of the ventricles and subarachnoid spaces. There are mild chronic small vessel ischemic changes within the periventricular white matter which are similar to previous MRI. There is no CT evidence of acute cortical infarction. Vascular: Prominent intracranial vascular calcifications. No hyperdense vessel identified. Skull: Negative for fracture or focal lesion. Sinuses/Orbits: The visualized paranasal sinuses and mastoid air cells are clear. No orbital abnormalities are seen. Other: None. IMPRESSION: Stable atrophy and mild chronic small vessel ischemic changes. No acute intracranial findings. Electronically Signed   By: Richardean Sale M.D.   On: 05/03/2019 15:58   Dg Knee Complete 4 Views Left  Result Date: 05/03/2019 CLINICAL DATA:  Pain after fall EXAM: LEFT KNEE - COMPLETE 4+ VIEW COMPARISON:  None. FINDINGS: Patient is status post left knee replacement. Hardware is in good position. No fracture or  dislocation identified. No effusions. Vascular calcifications noted. IMPRESSION: No acute abnormalities. Electronically Signed   By: Dorise Bullion III M.D   On: 05/03/2019 14:14   Dg Hip Unilat W Or Wo Pelvis 2-3 Views Left  Result Date: 05/03/2019 CLINICAL DATA:  Left hip pain post fall. EXAM: DG HIP (WITH OR WITHOUT PELVIS) 2-3V LEFT COMPARISON:  None. FINDINGS: There is no evidence of hip fracture or dislocation. Mild osteoarthritic changes of bilateral hips. Vascular calcifications noted. IMPRESSION: No acute fracture or dislocation identified about the left hip. Electronically Signed   By: Fidela Salisbury M.D.   On: 05/03/2019 14:14    EKG: Independently reviewed.  Sinus rhythm.  LVH.  Possible old inferior infarct.  No acute ST changes  Assessment/Plan: Principal Problem:   Elevated LFTs Active Problems:   Essential hypertension, benign   Type 2 diabetes mellitus without complication, with long-term current use of insulin (HCC)   Chronic diastolic heart failure (HCC)   Bilateral leg weakness   Pancreatic mass   Failure to thrive in adult    This patient was discussed with the ED physician, including pertinent vitals, physical exam findings, labs, and imaging.  We also discussed care given by the ED provider.  1. Elevated LFTs a. Admit b. Clear liquid diet c. MRCP in the morning d. Likely secondary to pancreatic mass - possible occlusion of common bile duct e. Repeat labs in AM f. GI to consult tomorrow. 2. Pancreatic mass a. MR Abdomen with MRCP tomorrow b. Although rare to have metastatic disease to pancrease, will check PSA 3. Type 2 diabetes a. Will reduce insulin and cover with sliding scale 4. Hypertension a. Continue antihypertensive  5. Chronic diastolic heart failure a. compensated 6. Weakness a.  7. Failure to thrive in adult a. Dietary to see  DVT prophylaxis: lovenox Consultants: GI Code Status: full code Family Communication: wife present   Disposition Plan: pending   Truett Mainland, DO

## 2019-05-03 NOTE — ED Notes (Signed)
Patient transported to CT 

## 2019-05-03 NOTE — ED Triage Notes (Signed)
Pt reports weakness tried to reach for something and fell. Pt reports pain to L hip and leg x 3 weeks.

## 2019-05-03 NOTE — ED Provider Notes (Signed)
[facility] EMERGENCY DEPARTMENT Provider Note   CSN: [id number] Arrival date & time: 05/03/19  1219    History   Chief Complaint Chief Complaint  Patient presents with   Fall   Hip Pain    HPI Damon Snyder. is a 85 y.o. male with a past medical history of IDDM, hypertension, arthritis who presents to ED for gradually worsening left-sided weakness for the past 3 months which resulted in a fall this morning.  States that 3 months ago, without any specific inciting factor, he felt like he began having weakness to the left lower extremity and left arm.  He was still able to ambulate.  However, about 3 weeks ago he transitioned to using a walker.  This morning he woke up with dizziness but "I felt confident so I try to walk with a cane but that was a mistake."  He fell onto his left hip because he felt like "my leg gave out on me."  Fall was witnessed by his wife and he denies any head injury or loss of consciousness.  He sat on a chair for about 1 hour and was able to ambulate.  However, he continues to complain of pain in his left hip, dizziness.  He admits to decreased appetite and activity since symptoms began.  He continues to use his insulin with sugars ranging as low as 40s and as high as 170s.  Denies any headache, vision changes, vomiting, diarrhea, abdominal pain, chest pain, shortness of breath but does note that he feels more fatigued as he has tried to ambulate and do regular activities for the past 3 months.  Denies any neck pain.     HPI  Past Medical History:  Diagnosis Date   Anemia    h/o    Arthritis    "left knee; shoulders" (09/14/2016)   Colon cancer ([place])     tx : chemo)    Difficulty in walking(719.7)    Dyspnea    relative to pain in her leg   Foot ulcer ([place])    Gout    High cholesterol    History of blood transfusion    "that's when they found out about the colon cancer"   Hypertension    Lower GI bleed    Prostate  cancer ([place])     treated with radiation    Type II diabetes mellitus ([place])     Patient Active Problem List   Diagnosis Date Noted   Pancreatic mass 05/03/2019   Elevated LFTs 05/03/2019   Failure to thrive in adult 05/03/2019   Total knee replacement status 09/14/2016   Primary osteoarthritis of left knee    Bilateral leg weakness 05/21/2015   Atherosclerosis of native arteries of extremity with intermittent claudication ([place]) 11/13/2014   Pain in lower limb 01/09/2014   Chronic diastolic heart failure ([place]) 09/23/2013   Near syncope 09/22/2013   Essential hypertension, benign 09/22/2013   Type 2 diabetes mellitus without complication, with long-term current use of insulin ([place]) 09/22/2013   Pain in joint, ankle and foot 04/01/2013   PVD (peripheral vascular disease) ([place]) 12/31/2012   Onychomycosis due to dermatophyte 12/31/2012    Past Surgical History:  Procedure Laterality Date   CATARACT EXTRACTION W/ INTRAOCULAR LENS  IMPLANT, BILATERAL Bilateral    COLECTOMY     JOINT REPLACEMENT     PROSTATE BIOPSY     TOTAL KNEE ARTHROPLASTY Left 09/14/2016   TOTAL KNEE ARTHROPLASTY Left 09/14/2016   Procedure:  LEFT TOTAL KNEE ARTHROPLASTY;  Surgeon: Leandrew Koyanagi, MD;  Location: [place];  Service: Orthopedics;  Laterality: Left;        Home Medications    Prior to Admission medications   Medication Sig Start Date End Date Taking? Authorizing Provider  allopurinol (ZYLOPRIM) 100 MG tablet Take 200 mg by mouth daily as needed (gout).    Yes [provider]  amLODipine (NORVASC) 10 MG tablet Take 10 mg by mouth daily.   Yes [provider]  aspirin EC 325 MG tablet Take 1 tablet (325 mg total) by mouth 2 (two) times daily. 09/14/16  Yes Leandrew Koyanagi, MD  atorvastatin (LIPITOR) 20 MG tablet Take 20 mg by mouth daily.   Yes [provider]  colchicine 0.6 MG tablet Take 0.6 mg by mouth every 4 (four) hours as needed (gout).    Yes  [provider]  diclofenac sodium (VOLTAREN) 1 % GEL Apply 4 g topically 4 (four) times daily as needed. Patient taking differently: Apply 2 g topically 4 (four) times daily as needed (for pain).  04/21/19  Yes Hilts, Legrand Como, MD  diphenhydrAMINE (BENADRYL) 25 mg capsule Take 25 mg by mouth every 6 (six) hours as needed for itching or allergies.   Yes [provider]  hydrochlorothiazide (HYDRODIURIL) 25 MG tablet Take 25 mg by mouth daily.  02/12/18  Yes [provider]  LANTUS SOLOSTAR 100 UNIT/ML Solostar Pen Inject 64 Units into the skin at bedtime.  11/17/14  Yes [provider]  Nebivolol HCl (BYSTOLIC) 20 MG TABS Take 20 mg by mouth daily.   Yes [provider]  sodium chloride (OCEAN) 0.65 % SOLN nasal spray Place 1 spray into both nostrils as needed for congestion.   Yes [provider]  NOVOFINE 32G X 6 MM MISC  03/11/13   [provider]  [facility] VERIO test strip 1 STRIP TWICE A DAY FOR BLOOD SUGAR DX E11.22 FINGERSTICK 09/09/18   [provider]    Family History Family History  Problem Relation Age of Onset   Hypertension Mother    Cancer Sister        Ovarian   Hypertension Father    Heart disease Brother        After age 42   Kidney disease Sister     Social History Social History   Tobacco Use   Smoking status: Never Smoker   Smokeless tobacco: Never Used  Substance Use Topics   Alcohol use: No    Alcohol/week: 0.0 standard drinks   Drug use: No     Allergies   Patient has no known allergies.   Review of Systems Review of Systems  Constitutional: Positive for activity change and appetite change. Negative for chills and fever.  HENT: Negative for ear pain, rhinorrhea, sneezing and sore throat.   Eyes: Negative for photophobia and visual disturbance.  Respiratory: Negative for cough, chest tightness, shortness of breath and wheezing.   Cardiovascular: Negative for chest pain and  palpitations.  Gastrointestinal: Negative for abdominal pain, blood in stool, constipation, diarrhea, nausea and vomiting.  Genitourinary: Negative for dysuria, hematuria and urgency.  Musculoskeletal: Positive for arthralgias and myalgias.  Skin: Negative for rash.  Neurological: Positive for dizziness. Negative for weakness and light-headedness.     Physical Exam Updated Vital Signs BP (!) 166/79    Pulse 60    Temp 98.1 F (36.7 C) (Oral)    Resp 17    SpO2 100%  Physical Exam Vitals signs and nursing note reviewed.  Constitutional:      General: He is not in acute distress.    Appearance: He is well-developed.  HENT:     Head: Normocephalic and atraumatic.     Nose: Nose normal.  Eyes:     General: No scleral icterus.       Right eye: No discharge.        Left eye: No discharge.     Conjunctiva/sclera: Conjunctivae normal.     Pupils: Pupils are equal, round, and reactive to light.  Neck:     Musculoskeletal: Normal range of motion and neck supple.  Cardiovascular:     Rate and Rhythm: Normal rate and regular rhythm.     Heart sounds: Normal heart sounds. No murmur. No friction rub. No gallop.   Pulmonary:     Effort: Pulmonary effort is normal. No respiratory distress.     Breath sounds: Normal breath sounds.  Abdominal:     General: Bowel sounds are normal. There is no distension.     Palpations: Abdomen is soft.     Tenderness: There is no abdominal tenderness. There is no guarding.  Musculoskeletal: Normal range of motion.        General: Tenderness present. No deformity.       Back:       Legs:     Comments: FROM of L hip and knee.  2+ DP pulse palpated bilaterally. No midline spinal tenderness present in lumbar, thoracic or cervical spine. No step-off palpated. No visible bruising, edema or temperature change noted. No objective signs of numbness present. No saddle anesthesia. Sensation intact to light touch. Strength slightly lower in left lower extremity  compared to right.  Skin:    General: Skin is warm and dry.     Findings: No rash.  Neurological:     General: No focal deficit present.     Mental Status: He is alert and oriented to person, place, and time.     Cranial Nerves: No cranial nerve deficit.     Motor: No abnormal muscle tone.     Coordination: Coordination normal.     Comments: Alert and oriented x3.  No facial asymmetry noted.  Sensation intact to light touch of bilateral upper extremities although noted to be weaker on left side with grip strength.      ED Treatments / Results  Labs (all labs ordered are listed, but only abnormal results are displayed) Labs Reviewed  COMPREHENSIVE METABOLIC PANEL - Abnormal; Notable for the following components:      Result Value   Potassium 3.4 (*)    CO2 19 (*)    Glucose, Bld 106 (*)    BUN 33 (*)    Creatinine, Ser 2.12 (*)    Albumin 3.4 (*)    AST 154 (*)    ALT 221 (*)    Alkaline Phosphatase 588 (*)    Total Bilirubin 5.9 (*)    GFR calc non Af Amer 27 (*)    GFR calc Af Amer 31 (*)    All other components within normal limits  CBC WITH DIFFERENTIAL/PLATELET - Abnormal; Notable for the following components:   Hemoglobin 12.1 (*)    HCT 37.7 (*)    All other components within normal limits  URINALYSIS, ROUTINE W REFLEX MICROSCOPIC - Abnormal; Notable for the following components:   Color, Urine AMBER (*)    APPearance HAZY (*)    Hgb urine dipstick SMALL (*)  Protein, ur 30 (*)    Bacteria, UA RARE (*)    All other components within normal limits  ACETAMINOPHEN LEVEL - Abnormal; Notable for the following components:   Acetaminophen (Tylenol), Serum <10 (*)    All other components within normal limits  LIPASE, BLOOD - Abnormal; Notable for the following components:   Lipase 122 (*)    All other components within normal limits  TROPONIN I (HIGH SENSITIVITY) - Abnormal; Notable for the following components:   Troponin I (High Sensitivity) 30 (*)    All other  components within normal limits  TROPONIN I (HIGH SENSITIVITY) - Abnormal; Notable for the following components:   Troponin I (High Sensitivity) 31 (*)    All other components within normal limits  SARS CORONAVIRUS 2 (HOSPITAL ORDER, [place] [facility])  ETHANOL  HEPATITIS PANEL, ACUTE  PSA  CBG MONITORING, ED    EKG None  Radiology Ct Abdomen Pelvis Wo Contrast  Result Date: 05/03/2019 CLINICAL DATA:  Transaminitis EXAM: CT ABDOMEN AND PELVIS WITHOUT CONTRAST TECHNIQUE: Multidetector CT imaging of the abdomen and pelvis was performed following the standard protocol without IV contrast. COMPARISON:  None. FINDINGS: Lower chest: There is a partially visualized 6 mm pulmonary nodule in the left lower lobe (axial series 3, image 1). There is an additional opacity in the left lower lobe measuring approximately 7 mm (axial series 3, image 9). There is mucous plugging and bronchiectasis involving the medial right lung base.The heart size is normal. Hepatobiliary: There are multiple indeterminate hypoattenuating masses in the liver most notably within the right hepatic lobe. These measure up to approximately 1.6 cm. Multiple gallstones versus gallbladder sludge is noted. The common bile duct is mildly dilated measuring up to approximately 9 mm proximally. There appears to be some intrahepatic biliary ductal dilatation. Pancreas: There is an ill-defined soft tissue density at the pancreatic head. This is not well characterized in the absence of IV contrast. This measures approximately 3.5 x 3.8 cm (axial series 3, image 30). Spleen: No splenic laceration or hematoma. Adrenals/Urinary Tract: --Adrenal glands: No adrenal hemorrhage. --Right kidney/ureter: Multiple right-sided renal cysts are noted. There is no hydronephrosis. --Left kidney/ureter: No hydronephrosis or perinephric hematoma. --Urinary bladder: Unremarkable. Stomach/Bowel: --Stomach/Duodenum: There is a small hiatal hernia.  --Small bowel: No dilatation or inflammation. --Colon: The patient is status post partial colectomy. A surgical anastomosis is noted at the level of the hepatic flexure. There is no evidence for an obstruction. --Appendix: The appendix is likely surgically absent. Vascular/Lymphatic: Atherosclerotic calcification is present within the non-aneurysmal abdominal aorta, without hemodynamically significant stenosis. --No retroperitoneal lymphadenopathy. --No mesenteric lymphadenopathy. --No pelvic or inguinal lymphadenopathy. Reproductive: Unremarkable Other: No ascites or free air. There is a right fat containing inguinal hernia. Musculoskeletal. There is a somewhat irregular appearance of the inferior endplate of the L3 vertebral body. This is favored to represent an atypical Schmorl's node. Degenerative changes are noted throughout the visualized lumbar spine. There is grade 1 anterolisthesis of L4 on L5. There is no acute osseous abnormality. IMPRESSION: 1. Distended gallbladder with evidence of gallbladder sludge versus cholelithiasis. There is probable intrahepatic and extrahepatic biliary ductal dilatation which is not well evaluated in the absence of IV contrast. If there is clinical concern for an obstructing process such as choledocholithiasis, follow-up with MRCP/ERCP is recommended. There is no CT evidence for acute cholecystitis. 2. Multiple small hypoattenuating masses throughout the liver as detailed above. These are incompletely characterized on this exam. Follow-up with a contrast-enhanced MRI  is recommended. 3. Somewhat ill-defined soft tissue density at the level of the pancreatic head. This is not well evaluated in the absence of IV contrast. Follow-up with a contrast-enhanced MRI is recommended. 4. Status post partial colectomy.  No evidence of an obstruction. 5. Multiple pulmonary nodules in the left lower lobe measuring up to approximately 7 mm. Non-contrast chest CT at 3-6 months is recommended.  If the nodules are stable at time of repeat CT, then future CT at 18-24 months (from today's scan) is considered optional for low-risk patients, but is recommended for high-risk patients. This recommendation follows the consensus statement: Guidelines for Management of Incidental Pulmonary Nodules Detected on CT Images: From the Fleischner Society 2017; Radiology 2017; 284:228-243. Electronically Signed   By: Constance Holster M.D.   On: 05/03/2019 18:27   Dg Chest 2 View  Result Date: 05/03/2019 CLINICAL DATA:  Fall. EXAM: CHEST - 2 VIEW COMPARISON:  August 13, 2015 FINDINGS: The heart size and mediastinal contours are within normal limits. Both lungs are clear. The visualized skeletal structures are unremarkable. IMPRESSION: No active cardiopulmonary disease. Electronically Signed   By: Dorise Bullion III M.D   On: 05/03/2019 14:13   Ct Head Wo Contrast  Result Date: 05/03/2019 CLINICAL DATA:  Weakness with recent fall.  No known head injury. EXAM: CT HEAD WITHOUT CONTRAST TECHNIQUE: Contiguous axial images were obtained from the base of the skull through the vertex without intravenous contrast. COMPARISON:  MRI brain 09/22/2013. FINDINGS: Brain: There is no evidence of acute intracranial hemorrhage, mass lesion, brain edema or extra-axial fluid collection. There is stable atrophy with mild prominence of the ventricles and subarachnoid spaces. There are mild chronic small vessel ischemic changes within the periventricular white matter which are similar to previous MRI. There is no CT evidence of acute cortical infarction. Vascular: Prominent intracranial vascular calcifications. No hyperdense vessel identified. Skull: Negative for fracture or focal lesion. Sinuses/Orbits: The visualized paranasal sinuses and mastoid air cells are clear. No orbital abnormalities are seen. Other: None. IMPRESSION: Stable atrophy and mild chronic small vessel ischemic changes. No acute intracranial findings. Electronically  Signed   By: Richardean Sale M.D.   On: 05/03/2019 15:58   Dg Knee Complete 4 Views Left  Result Date: 05/03/2019 CLINICAL DATA:  Pain after fall EXAM: LEFT KNEE - COMPLETE 4+ VIEW COMPARISON:  None. FINDINGS: Patient is status post left knee replacement. Hardware is in good position. No fracture or dislocation identified. No effusions. Vascular calcifications noted. IMPRESSION: No acute abnormalities. Electronically Signed   By: Dorise Bullion III M.D   On: 05/03/2019 14:14   Dg Hip Unilat W Or Wo Pelvis 2-3 Views Left  Result Date: 05/03/2019 CLINICAL DATA:  Left hip pain post fall. EXAM: DG HIP (WITH OR WITHOUT PELVIS) 2-3V LEFT COMPARISON:  None. FINDINGS: There is no evidence of hip fracture or dislocation. Mild osteoarthritic changes of bilateral hips. Vascular calcifications noted. IMPRESSION: No acute fracture or dislocation identified about the left hip. Electronically Signed   By: Fidela Salisbury M.D.   On: 05/03/2019 14:14    Procedures Procedures (including critical care time)  Medications Ordered in ED Medications  sodium chloride 0.9 % bolus 1,000 mL (0 mLs Intravenous Stopped 05/03/19 1935)     Initial Impression / Assessment and Plan / ED Course  I have reviewed the triage vital signs and the nursing notes.  Pertinent labs & imaging results that were available during my care of the patient were reviewed by me and considered  in my medical decision making (see chart for details).  Clinical Course as of May 02 1938  Sat May 03, 2019  1434 Negative for fracture or other abnormality.  DG Hip Unilat W or Wo Pelvis 2-3 Views Left [HK]  1434 Negative.  DG Knee Complete 4 Views Left [HK]  1651 AST(!): 154 [HK]  1651 ALT(!): 221 [HK]  1651 Alkaline Phosphatase(!): 588 [HK]  1651 Total Bilirubin(!): 5.9 [HK]  1651 Creatinine(!): 2.12 [HK]  1651 Troponin I (High Sensitivity)(!): 30 [HK]    Clinical Course User Index [HK] Delia Heady, PA-C       85 year old male  presents to ED for gradually worsening generalized weakness for the past 3 months.  He reports weight loss as well as decreased appetite.  He did have a fall today which resulted in left hip and knee pain.  He states that he fell because he felt his leg was giving out on him.  On exam patient has tenderness palpation of the left pelvic area without rebound or guarding.  Full range of motion of hip and knee.  Area is neurovascularly intact.  Abdomen is soft, nontender nondistended.  X-ray of the hip and knee are negative.  CT of the head is negative.  Lab work significant for transaminitis, T bili of 5.9, creatinine of 2.12.  Troponin is 30 patient denies any chest pain.  CT of the abdomen pelvis without contrast shows possible cholelithiasis versus gallbladder sludge.  Recommend ERCP/MRCP.  I spoke to Dr. Watt Climes of [facility] who recommends ordering MRCP and that he will see the patient in consult tomorrow.  Will admit to hospitalist for further management.  I suspect that his gallbladder issues are the cause of his decreased appetite, causing decreased activity level and increased weakness which is resulting in his falls.  Final Clinical Impressions(s) / ED Diagnoses   Final diagnoses:  AKI (acute kidney injury) [facility])  Transaminitis    ED Discharge Orders    None       Delia Heady, PA-C 05/03/19 1939    Lucrezia Starch, MD 05/04/19 1113

## 2019-05-03 NOTE — Progress Notes (Signed)
Hypoglycemic Event  CBG: 46  Treatment: 4 oz juice/soda  Symptoms: None  Follow-up CBG: Time:2334 CBG Result: 84  Possible Reasons for Event: Inadequate meal intake  Comments/MD notified:NA    Vidal Schwalbe

## 2019-05-03 NOTE — Progress Notes (Signed)
Pt came from ED to 6N27. A/ox4, VSS. Blood sugar if 46, gave apple juice. Will recheck later and will continue to monitor pt.

## 2019-05-03 NOTE — ED Notes (Signed)
Please call pt's spouse, [name] when pt is roomed and ready.

## 2019-05-04 ENCOUNTER — Inpatient Hospital Stay (HOSPITAL_COMMUNITY): Payer: Medicare Other

## 2019-05-04 ENCOUNTER — Encounter (HOSPITAL_COMMUNITY): Payer: Self-pay | Admitting: Gastroenterology

## 2019-05-04 DIAGNOSIS — K8689 Other specified diseases of pancreas: Secondary | ICD-10-CM

## 2019-05-04 DIAGNOSIS — K831 Obstruction of bile duct: Secondary | ICD-10-CM

## 2019-05-04 DIAGNOSIS — Z794 Long term (current) use of insulin: Secondary | ICD-10-CM

## 2019-05-04 DIAGNOSIS — I739 Peripheral vascular disease, unspecified: Secondary | ICD-10-CM

## 2019-05-04 DIAGNOSIS — E119 Type 2 diabetes mellitus without complications: Secondary | ICD-10-CM

## 2019-05-04 DIAGNOSIS — I10 Essential (primary) hypertension: Secondary | ICD-10-CM

## 2019-05-04 LAB — HEPATITIS PANEL, ACUTE
HCV Ab: <0.1 s/co ratio (ref 0.0–0.9)
Hep A IgM: NEGATIVE
Hep B C IgM: NEGATIVE
Hepatitis B Surface Ag: NEGATIVE

## 2019-05-04 LAB — GLUCOSE, CAPILLARY
Glucose-Capillary: 58 mg/dL — ABNORMAL LOW (ref 70–99)
Glucose-Capillary: 111 mg/dL — ABNORMAL HIGH (ref 70–99)
Glucose-Capillary: 62 mg/dL — ABNORMAL LOW (ref 70–99)
Glucose-Capillary: 92 mg/dL (ref 70–99)
Glucose-Capillary: 89 mg/dL (ref 70–99)
Glucose-Capillary: 170 mg/dL — ABNORMAL HIGH (ref 70–99)

## 2019-05-04 LAB — CBC
HCT: 35.1 % — ABNORMAL LOW (ref 39.0–52.0)
Hemoglobin: 11.2 g/dL — ABNORMAL LOW (ref 13.0–17.0)
MCH: 28.4 pg (ref 26.0–34.0)
MCHC: 31.9 g/dL (ref 30.0–36.0)
MCV: 88.9 fL (ref 80.0–100.0)
Platelets: 184 10*3/uL (ref 150–400)
RBC: 3.95 MIL/uL — ABNORMAL LOW (ref 4.22–5.81)
RDW: 14.1 % (ref 11.5–15.5)
WBC: 6.8 10*3/uL (ref 4.0–10.5)
nRBC: 0 % (ref 0.0–0.2)

## 2019-05-04 LAB — COMPREHENSIVE METABOLIC PANEL
ALT: 196 U/L — ABNORMAL HIGH (ref 0–44)
AST: 138 U/L — ABNORMAL HIGH (ref 15–41)
Albumin: 2.9 g/dL — ABNORMAL LOW (ref 3.5–5.0)
Alkaline Phosphatase: 505 U/L — ABNORMAL HIGH (ref 38–126)
Anion gap: 13 (ref 5–15)
BUN: 28 mg/dL — ABNORMAL HIGH (ref 8–23)
CO2: 18 mmol/L — ABNORMAL LOW (ref 22–32)
Calcium: 8.8 mg/dL — ABNORMAL LOW (ref 8.9–10.3)
Chloride: 107 mmol/L (ref 98–111)
Creatinine, Ser: 1.79 mg/dL — ABNORMAL HIGH (ref 0.61–1.24)
GFR calc Af Amer: 38 mL/min — ABNORMAL LOW (ref >60)
GFR calc non Af Amer: 33 mL/min — ABNORMAL LOW (ref >60)
Glucose, Bld: 44 mg/dL — CL (ref 70–99)
Potassium: 3.2 mmol/L — ABNORMAL LOW (ref 3.5–5.1)
Sodium: 138 mmol/L (ref 135–145)
Total Bilirubin: 5.6 mg/dL — ABNORMAL HIGH (ref 0.3–1.2)
Total Protein: 6.3 g/dL — ABNORMAL LOW (ref 6.5–8.1)

## 2019-05-04 LAB — LIPASE, BLOOD: Lipase: 125 U/L — ABNORMAL HIGH (ref 11–51)

## 2019-05-04 MED ORDER — MORPHINE SULFATE (PF) 2 MG/ML IV SOLN
2.0000 mg | Freq: Four times a day (QID) | INTRAVENOUS | Status: DC | PRN
  Administered 2019-05-04: 09:00:00 2 mg via INTRAVENOUS
  Filled 2019-05-04: qty 1

## 2019-05-04 MED ORDER — ASPIRIN EC 81 MG PO TBEC
81.0000 mg | DELAYED_RELEASE_TABLET | Freq: Every day | ORAL | Status: DC
  Administered 2019-05-06 – 2019-05-07 (×2): 81 mg via ORAL
  Filled 2019-05-04 (×2): qty 1

## 2019-05-04 MED ORDER — DEXTROSE 50 % IV SOLN
INTRAVENOUS | Status: AC
  Filled 2019-05-04: qty 50

## 2019-05-04 MED ORDER — DEXTROSE-NACL 5-0.9 % IV SOLN
INTRAVENOUS | Status: DC
  Administered 2019-05-04 – 2019-05-05 (×2): via INTRAVENOUS

## 2019-05-04 MED ORDER — DEXTROSE 50 % IV SOLN
25.0000 mL | Freq: Once | INTRAVENOUS | Status: AC
  Administered 2019-05-04: 07:00:00 25 mL via INTRAVENOUS

## 2019-05-04 MED ORDER — OXYCODONE HCL 5 MG PO TABS
5.0000 mg | ORAL_TABLET | ORAL | Status: DC | PRN
  Administered 2019-05-04 – 2019-05-06 (×2): 5 mg via ORAL
  Filled 2019-05-04 (×2): qty 1

## 2019-05-04 MED ORDER — POTASSIUM CHLORIDE 10 MEQ/100ML IV SOLN
10.0000 meq | INTRAVENOUS | Status: AC
  Administered 2019-05-04 (×3): 10 meq via INTRAVENOUS
  Filled 2019-05-04 (×3): qty 100

## 2019-05-04 MED ORDER — DEXTROSE 50 % IV SOLN
12.5000 g | Freq: Once | INTRAVENOUS | Status: AC
  Administered 2019-05-04: 13:00:00 12.5 g via INTRAVENOUS

## 2019-05-04 MED ORDER — GADOBUTROL 1 MMOL/ML IV SOLN
10.0000 mL | Freq: Once | INTRAVENOUS | Status: AC | PRN
  Administered 2019-05-04: 18:00:00 10 mL via INTRAVENOUS

## 2019-05-04 NOTE — Progress Notes (Signed)
Hypoglycemic Event  CBG: 44  Treatment: 4 oz juice/soda   Symptoms: None  Follow-up CBG: Time: 4619 CBG Result:58  Possible Reasons for Event: Inadequate meal intake  Comments/MD notified: Pending    Damon Snyder

## 2019-05-04 NOTE — Progress Notes (Signed)
MRI called to put pt to NPO for at least 5 hours for MRCP. Pt blood sugar has been dropping with clear liquids. Paged NP on call for additional support in IVF.

## 2019-05-04 NOTE — Progress Notes (Signed)
Spoke to nurse about changing the order to W W/O or to W/O if contrast is not wanted. Said she would reach out to Doctor about updating order.

## 2019-05-04 NOTE — Progress Notes (Addendum)
PROGRESS NOTE    Damon Snyder.  WUJ:[id number]  DOB: [birth year]  DOA: 05/03/2019 PCP: Mayra Neer, MD  Brief Narrative:  85 y.o. male with a history of colon cancer status post partial colectomy/chemo therapy, prostate cancer status post radiation rx and in remission, hypertension, type 2 diabetes on insulin, chronic joint pain/arthritis requiring left knee replacement in 2014 and who was recently evaluated by orthopedics for left hip pain presented to the ED after a fall and with complaints of generalized weakness, persistent left hip pain radiating to the left knee especially when he gets up from bed, associated with weight loss (30 pounds over 2 months) and loss of appetite/poor oral intake.  Work-up in the ED revealed significantly elevated LFTs.  This was followed by CT abdomen which showed pancreatic soft tissue density at level of pancreatic head (3.5 x 3.8 cm) and pulmonary nodules.  There was also report of multiple hypoattenuated lesions in the liver, distended gallbladder with possible sludge but no evidence of cholecystitis.  Patient admitted to hospitalist service for further work-up with GI consultation. Patient was resumed on Lantus insulin (30 units as compared to 64 units that is listed as home dose) upon admission and hospital course complicated by refractory hypoglycemia overnight.  Subjective:  Patient awake alert oriented x3.  Very pleasant to talk.  He states he has had discomfort in the left hip radiating to the left knee for over a month.  He was evaluated by orthopedics but intra-articular steroid injection was deferred and was prescribed Voltaren gel that apparently was not helping him much.  Patient denies any hematochezia/hematuria or melena at home  Objective: Vitals:   05/03/19 2130 05/03/19 2230 05/04/19 0417 05/04/19 0908  BP: (!) 162/70 (!) 183/64 (!) 135/54 (!) 163/62  Pulse: 63 64 67 60  Resp: 14 15 14 16   Temp:  97.6 F (36.4 C) (!) 97.3 F (36.3 C)    TempSrc:  Oral Oral   SpO2: 100% 100% 100% 100%  Weight:  101.9 kg    Height:  6\' 4"  (1.93 m)      Intake/Output Summary (Last 24 hours) at 05/04/2019 1254 Last data filed at 05/04/2019 1139 Gross per 24 hour  Intake 599.32 ml  Output 1175 ml  Net -575.68 ml   Filed Weights   05/03/19 2230  Weight: 101.9 kg    Physical Examination:  General exam: Appears calm and comfortable, mildly icteric Respiratory system: Clear to auscultation. Respiratory effort normal. Cardiovascular system: S1 & S2 heard, RRR. No JVD, murmurs, rubs. No pedal edema. Gastrointestinal system: Abdomen is nondistended, soft and nontender. No organomegaly or masses felt. Normal bowel sounds heard. Central nervous system: Alert and oriented. No focal neurological deficits. Extremities: Symmetric 4 x 5 power, decreased range of motion along left hip flexion, no significant knee joint swelling noted Skin: No rashes, lesions or ulcers Psychiatry: Judgement and insight appear normal. Mood & affect appropriate.     Data Reviewed: I have personally reviewed following labs and imaging studies  CBC: Recent Labs  Lab 05/03/19 1503 05/04/19 0255  WBC 6.2 6.8  NEUTROABS 4.6  --   HGB 12.1* 11.2*  HCT 37.7* 35.1*  MCV 89.1 88.9  PLT 196 956   Basic Metabolic Panel: Recent Labs  Lab 05/03/19 1503 05/04/19 0255  NA 137 138  K 3.4* 3.2*  CL 106 107  CO2 19* 18*  GLUCOSE 106* 44*  BUN 33* 28*  CREATININE 2.12* 1.79*  CALCIUM 9.3 8.8*  GFR: Estimated Creatinine Clearance: 34.3 mL/min (A) (by C-G formula based on SCr of 1.79 mg/dL (H)). Liver Function Tests: Recent Labs  Lab 05/03/19 1503 05/04/19 0255  AST 154* 138*  ALT 221* 196*  ALKPHOS 588* 505*  BILITOT 5.9* 5.6*  PROT 7.3 6.3*  ALBUMIN 3.4* 2.9*   Recent Labs  Lab 05/03/19 1726 05/04/19 0255  LIPASE 122* 125*   No results for input(s): AMMONIA in the last 168 hours. Coagulation Profile: No results for input(s): INR, PROTIME in  the last 168 hours. Cardiac Enzymes: No results for input(s): CKTOTAL, CKMB, CKMBINDEX, TROPONINI in the last 168 hours. BNP (last 3 results) No results for input(s): PROBNP in the last 8760 hours. HbA1C: Recent Labs    05/03/19 2244  HGBA1C 6.0*   CBG: Recent Labs  Lab 05/03/19 2234 05/03/19 2334 05/04/19 0703 05/04/19 0747 05/04/19 1213  GLUCAP 46* 84 58* 111* 62*   Lipid Profile: No results for input(s): CHOL, HDL, LDLCALC, TRIG, CHOLHDL, LDLDIRECT in the last 72 hours. Thyroid Function Tests: No results for input(s): TSH, T4TOTAL, FREET4, T3FREE, THYROIDAB in the last 72 hours. Anemia Panel: No results for input(s): VITAMINB12, FOLATE, FERRITIN, TIBC, IRON, RETICCTPCT in the last 72 hours. Sepsis Labs: No results for input(s): PROCALCITON, LATICACIDVEN in the last 168 hours.  Recent Results (from the past 240 hour(s))  SARS Coronavirus 2 [facility] order, Performed in [facility] hospital lab) Nasopharyngeal Nasopharyngeal Swab     Status: None   Collection Time: 05/03/19  6:36 PM   Specimen: Nasopharyngeal Swab  Result Value Ref Range Status   SARS Coronavirus 2 NEGATIVE NEGATIVE Final    Comment: (NOTE) If result is NEGATIVE SARS-CoV-2 target nucleic acids are NOT DETECTED. The SARS-CoV-2 RNA is generally detectable in upper and lower  respiratory specimens during the acute phase of infection. The lowest  concentration of SARS-CoV-2 viral copies this assay can detect is 250  copies / mL. A negative result does not preclude SARS-CoV-2 infection  and should not be used as the sole basis for treatment or other  patient management decisions.  A negative result may occur with  improper specimen collection / handling, submission of specimen other  than nasopharyngeal swab, presence of viral mutation(s) within the  areas targeted by this assay, and inadequate number of viral copies  (<250 copies / mL). A negative result must be combined with clinical  observations,  patient history, and epidemiological information. If result is POSITIVE SARS-CoV-2 target nucleic acids are DETECTED. The SARS-CoV-2 RNA is generally detectable in upper and lower  respiratory specimens dur ing the acute phase of infection.  Positive  results are indicative of active infection with SARS-CoV-2.  Clinical  correlation with patient history and other diagnostic information is  necessary to determine patient infection status.  Positive results do  not rule out bacterial infection or co-infection with other viruses. If result is PRESUMPTIVE POSTIVE SARS-CoV-2 nucleic acids MAY BE PRESENT.   A presumptive positive result was obtained on the submitted specimen  and confirmed on repeat testing.  While 2019 novel coronavirus  (SARS-CoV-2) nucleic acids may be present in the submitted sample  additional confirmatory testing may be necessary for epidemiological  and / or clinical management purposes  to differentiate between  SARS-CoV-2 and other Sarbecovirus currently known to infect humans.  If clinically indicated additional testing with an alternate test  methodology [phone]) is advised. The SARS-CoV-2 RNA is generally  detectable in upper and lower respiratory sp ecimens during the acute  phase of infection. The expected result is Negative. Fact Sheet for Patients:  StrictlyIdeas.no Fact Sheet for Healthcare Providers: BankingDealers.co.za This test is not yet approved or cleared by the [place] FDA and has been authorized for detection and/or diagnosis of SARS-CoV-2 by FDA under an Emergency Use Authorization (EUA).  This EUA will remain in effect (meaning this test can be used) for the duration of the COVID-19 declaration under Section 564(b)(1) of the Act, 21 U.S.C. section 360bbb-3(b)(1), unless the authorization is terminated or revoked sooner. Performed at [facility], [place] [address], [place],  [place] [address]       Radiology Studies: Ct Abdomen Pelvis Wo Contrast  Result Date: 05/03/2019 CLINICAL DATA:  Transaminitis EXAM: CT ABDOMEN AND PELVIS WITHOUT CONTRAST TECHNIQUE: Multidetector CT imaging of the abdomen and pelvis was performed following the standard protocol without IV contrast. COMPARISON:  None. FINDINGS: Lower chest: There is a partially visualized 6 mm pulmonary nodule in the left lower lobe (axial series 3, image 1). There is an additional opacity in the left lower lobe measuring approximately 7 mm (axial series 3, image 9). There is mucous plugging and bronchiectasis involving the medial right lung base.The heart size is normal. Hepatobiliary: There are multiple indeterminate hypoattenuating masses in the liver most notably within the right hepatic lobe. These measure up to approximately 1.6 cm. Multiple gallstones versus gallbladder sludge is noted. The common bile duct is mildly dilated measuring up to approximately 9 mm proximally. There appears to be some intrahepatic biliary ductal dilatation. Pancreas: There is an ill-defined soft tissue density at the pancreatic head. This is not well characterized in the absence of IV contrast. This measures approximately 3.5 x 3.8 cm (axial series 3, image 30). Spleen: No splenic laceration or hematoma. Adrenals/Urinary Tract: --Adrenal glands: No adrenal hemorrhage. --Right kidney/ureter: Multiple right-sided renal cysts are noted. There is no hydronephrosis. --Left kidney/ureter: No hydronephrosis or perinephric hematoma. --Urinary bladder: Unremarkable. Stomach/Bowel: --Stomach/Duodenum: There is a small hiatal hernia. --Small bowel: No dilatation or inflammation. --Colon: The patient is status post partial colectomy. A surgical anastomosis is noted at the level of the hepatic flexure. There is no evidence for an obstruction. --Appendix: The appendix is likely surgically absent. Vascular/Lymphatic: Atherosclerotic calcification is present  within the non-aneurysmal abdominal aorta, without hemodynamically significant stenosis. --No retroperitoneal lymphadenopathy. --No mesenteric lymphadenopathy. --No pelvic or inguinal lymphadenopathy. Reproductive: Unremarkable Other: No ascites or free air. There is a right fat containing inguinal hernia. Musculoskeletal. There is a somewhat irregular appearance of the inferior endplate of the L3 vertebral body. This is favored to represent an atypical Schmorl's node. Degenerative changes are noted throughout the visualized lumbar spine. There is grade 1 anterolisthesis of L4 on L5. There is no acute osseous abnormality. IMPRESSION: 1. Distended gallbladder with evidence of gallbladder sludge versus cholelithiasis. There is probable intrahepatic and extrahepatic biliary ductal dilatation which is not well evaluated in the absence of IV contrast. If there is clinical concern for an obstructing process such as choledocholithiasis, follow-up with MRCP/ERCP is recommended. There is no CT evidence for acute cholecystitis. 2. Multiple small hypoattenuating masses throughout the liver as detailed above. These are incompletely characterized on this exam. Follow-up with a contrast-enhanced MRI is recommended. 3. Somewhat ill-defined soft tissue density at the level of the pancreatic head. This is not well evaluated in the absence of IV contrast. Follow-up with a contrast-enhanced MRI is recommended. 4. Status post partial colectomy.  No evidence of an obstruction. 5. Multiple pulmonary nodules in  the left lower lobe measuring up to approximately 7 mm. Non-contrast chest CT at 3-6 months is recommended. If the nodules are stable at time of repeat CT, then future CT at 18-24 months (from today's scan) is considered optional for low-risk patients, but is recommended for high-risk patients. This recommendation follows the consensus statement: Guidelines for Management of Incidental Pulmonary Nodules Detected on CT Images: From  the Fleischner Society 2017; Radiology 2017; 284:228-243. Electronically Signed   By: Constance Holster M.D.   On: 05/03/2019 18:27   Dg Chest 2 View  Result Date: 05/03/2019 CLINICAL DATA:  Fall. EXAM: CHEST - 2 VIEW COMPARISON:  August 13, 2015 FINDINGS: The heart size and mediastinal contours are within normal limits. Both lungs are clear. The visualized skeletal structures are unremarkable. IMPRESSION: No active cardiopulmonary disease. Electronically Signed   By: Dorise Bullion III M.D   On: 05/03/2019 14:13   Ct Head Wo Contrast  Result Date: 05/03/2019 CLINICAL DATA:  Weakness with recent fall.  No known head injury. EXAM: CT HEAD WITHOUT CONTRAST TECHNIQUE: Contiguous axial images were obtained from the base of the skull through the vertex without intravenous contrast. COMPARISON:  MRI brain 09/22/2013. FINDINGS: Brain: There is no evidence of acute intracranial hemorrhage, mass lesion, brain edema or extra-axial fluid collection. There is stable atrophy with mild prominence of the ventricles and subarachnoid spaces. There are mild chronic small vessel ischemic changes within the periventricular white matter which are similar to previous MRI. There is no CT evidence of acute cortical infarction. Vascular: Prominent intracranial vascular calcifications. No hyperdense vessel identified. Skull: Negative for fracture or focal lesion. Sinuses/Orbits: The visualized paranasal sinuses and mastoid air cells are clear. No orbital abnormalities are seen. Other: None. IMPRESSION: Stable atrophy and mild chronic small vessel ischemic changes. No acute intracranial findings. Electronically Signed   By: Richardean Sale M.D.   On: 05/03/2019 15:58   Dg Knee Complete 4 Views Left  Result Date: 05/03/2019 CLINICAL DATA:  Pain after fall EXAM: LEFT KNEE - COMPLETE 4+ VIEW COMPARISON:  None. FINDINGS: Patient is status post left knee replacement. Hardware is in good position. No fracture or dislocation identified.  No effusions. Vascular calcifications noted. IMPRESSION: No acute abnormalities. Electronically Signed   By: Dorise Bullion III M.D   On: 05/03/2019 14:14   Dg Hip Unilat W Or Wo Pelvis 2-3 Views Left  Result Date: 05/03/2019 CLINICAL DATA:  Left hip pain post fall. EXAM: DG HIP (WITH OR WITHOUT PELVIS) 2-3V LEFT COMPARISON:  None. FINDINGS: There is no evidence of hip fracture or dislocation. Mild osteoarthritic changes of bilateral hips. Vascular calcifications noted. IMPRESSION: No acute fracture or dislocation identified about the left hip. Electronically Signed   By: Fidela Salisbury M.D.   On: 05/03/2019 14:14     Scheduled Meds:  amLODipine  10 mg Oral Daily   aspirin EC  81 mg Oral Daily   atorvastatin  20 mg Oral Daily   dextrose       enoxaparin (LOVENOX) injection  40 mg Subcutaneous QHS   hydrochlorothiazide  25 mg Oral Daily   insulin aspart  0-15 Units Subcutaneous TID WC   insulin aspart  0-5 Units Subcutaneous QHS   nebivolol  20 mg Oral Q0600   Continuous Infusions:  sodium chloride 125 mL/hr at 05/04/19 0435   dextrose 5 % and 0.9% NaCl 50 mL/hr at 05/04/19 1246    Assessment & Plan:    1.  Painless obstructive jaundice: Patient denies any  abdominal pain, nausea or vomiting.  Given history of weight loss high suspicion for malignancy.  MRCP scheduled for this afternoon.  Appreciate GI evaluation.  May need ERCP/stenting.  Will send CA-19-9 and CEA level.  Consider oncology evaluation if elevated as patient apparently evaluated at [facility] long previously for prostate cancer/colon cancer  2.  Liver/lung nodules: Concerning for metastatic disease especially with his history of colon cancer/prostate cancer and recent weight loss.  PSA however only at 0.35, unlikely to be prostate cancer.  GI tumor markers sent as discussed above.  3.  Left hip/knee pain: Orthopedics felt patient's knee pain is referred pain from left hip arthritis.  He did undergo x-ray of  the left hip and knee yesterday which showed mild hip arthritis.  Not sure if the symptoms could be related to bone metastasis.  May need bone scan/PET scan at some point.  4.  Hypoglycemia: Hold Lantus for now.  Given poor oral intake and recurrent episodes in spite of dextrose 50 ampoules, will order dextrose infusion.  Can titrate to off once blood glucose improved and stable.  5.  Acute renal failure with history of chronic kidney disease stage III: Patient's baseline creatinine appears to be around 1.4.  Creatinine level elevated at 2 on presentation and now improving to 1.7.  Encourage oral fluid intake and IV hydration as discussed above.  DC HCTZ.  PVR to rule out urinary retention.  No evidence of hydronephrosis on CT.  Labs in a.m.  6.  Hypertension: Continue Norvasc/nebivolol.  DC HCTZ  7.  Hyperlipidemia: On statins.  Repeat lipid profile in a.m. given recent weight loss.  8.  Peripheral vascular disease: On statins and ?Aspirin 325 mg twice daily.  Will change to 81 mg daily  DVT prophylaxis: Lovenox Code Status: Full code Family / Patient Communication: Discussed in detail with patient all questions answered to satisfaction Disposition Plan: Likely home when medically cleared     LOS: 1 day    Time spent: 35 minutes    Guilford Shi, MD [facility] Pager [phone]  If 7PM-7AM, please contact night-coverage [email or web address] Password [email or web address] 05/04/2019, 12:54 PM

## 2019-05-04 NOTE — Consult Note (Signed)
Reason for Consult: Abnormal CT abnormal liver tests Referring Physician: Hospital team  Damon Snyder. is an 85 y.o. male.  HPI: Patient seen and examined and case discussed with the ER physician and his office computer chart and his hospital computer chart was reviewed and for 2 to 3 months he has had no appetite and 20 to 30 pound weight loss but no obvious abdominal symptoms mostly his hip and knee have been bothering him and his liver tests a few months ago were normal in the office and he has had a colon cancer in prostate cancer in the past but no other GI cancers run in the family he has no other specific complaints and we discussed his CT findings  Past Medical History:  Diagnosis Date  . Anemia    h/o   . Arthritis    "left knee; shoulders" (09/14/2016)  . Colon cancer ([place])     tx : chemo)   . Difficulty in walking(719.7)   . Dyspnea    relative to pain in her leg  . Foot ulcer ([place])   . Gout   . High cholesterol   . History of blood transfusion    "that's when they found out about the colon cancer"  . Hypertension   . Lower GI bleed   . Prostate cancer [facility])     treated with radiation   . Type II diabetes mellitus ([place])     Past Surgical History:  Procedure Laterality Date  . CATARACT EXTRACTION W/ INTRAOCULAR LENS  IMPLANT, BILATERAL Bilateral   . COLECTOMY    . JOINT REPLACEMENT    . PROSTATE BIOPSY    . TOTAL KNEE ARTHROPLASTY Left 09/14/2016  . TOTAL KNEE ARTHROPLASTY Left 09/14/2016   Procedure: LEFT TOTAL KNEE ARTHROPLASTY;  Surgeon: Leandrew Koyanagi, MD;  Location: [facility];  Service: Orthopedics;  Laterality: Left;    Family History  Problem Relation Age of Onset  . Hypertension Mother   . Cancer Sister        Ovarian  . Hypertension Father   . Heart disease Brother        After age 13  . Kidney disease Sister     Social History:  reports that he has never smoked. He has never used smokeless tobacco. He reports that he does not drink alcohol or use  drugs.  Allergies: No Known Allergies  Medications: I have reviewed the patient's current medications.  Results for orders placed or performed during the hospital encounter of 05/03/19 (from the past 48 hour(s))  POC CBG, ED     Status: None   Collection Time: 05/03/19  1:28 PM  Result Value Ref Range   Glucose-Capillary 97 70 - 99 mg/dL   Comment 1 Notify RN    Comment 2 Document in Chart   Comprehensive metabolic panel     Status: Abnormal   Collection Time: 05/03/19  3:03 PM  Result Value Ref Range   Sodium 137 135 - 145 mmol/L   Potassium 3.4 (L) 3.5 - 5.1 mmol/L   Chloride 106 98 - 111 mmol/L   CO2 19 (L) 22 - 32 mmol/L   Glucose, Bld 106 (H) 70 - 99 mg/dL   BUN 33 (H) 8 - 23 mg/dL   Creatinine, Ser 2.12 (H) 0.61 - 1.24 mg/dL   Calcium 9.3 8.9 - 10.3 mg/dL   Total Protein 7.3 6.5 - 8.1 g/dL   Albumin 3.4 (L) 3.5 - 5.0 g/dL   AST 154 (  H) 15 - 41 U/L   ALT 221 (H) 0 - 44 U/L   Alkaline Phosphatase 588 (H) 38 - 126 U/L   Total Bilirubin 5.9 (H) 0.3 - 1.2 mg/dL   GFR calc non Af Amer 27 (L) >60 mL/min   GFR calc Af Amer 31 (L) >60 mL/min   Anion gap 12 5 - 15    Comment: Performed at [facility] [address] [place], [place] [address]  CBC with Differential     Status: Abnormal   Collection Time: 05/03/19  3:03 PM  Result Value Ref Range   WBC 6.2 4.0 - 10.5 K/uL   RBC 4.23 4.22 - 5.81 MIL/uL   Hemoglobin 12.1 (L) 13.0 - 17.0 g/dL   HCT 37.7 (L) 39.0 - 52.0 %   MCV 89.1 80.0 - 100.0 fL   MCH 28.6 26.0 - 34.0 pg   MCHC 32.1 30.0 - 36.0 g/dL   RDW 14.3 11.5 - 15.5 %   Platelets 196 150 - 400 K/uL   nRBC 0.0 0.0 - 0.2 %   Neutrophils Relative % 74 %   Neutro Abs 4.6 1.7 - 7.7 K/uL   Lymphocytes Relative 11 %   Lymphs Abs 0.7 0.7 - 4.0 K/uL   Monocytes Relative 10 %   Monocytes Absolute 0.6 0.1 - 1.0 K/uL   Eosinophils Relative 4 %   Eosinophils Absolute 0.2 0.0 - 0.5 K/uL   Basophils Relative 1 %   Basophils Absolute 0.1 0.0 - 0.1 K/uL   Immature  Granulocytes 0 %   Abs Immature Granulocytes 0.02 0.00 - 0.07 K/uL    Comment: Performed at [facility] [address] [place], [place] [address]  Troponin I (High Sensitivity)     Status: Abnormal   Collection Time: 05/03/19  3:03 PM  Result Value Ref Range   Troponin I (High Sensitivity) 30 (H) <18 ng/L    Comment: (NOTE) Elevated high sensitivity troponin I (hsTnI) values and significant  changes across serial measurements may suggest ACS but many other  chronic and acute conditions are known to elevate hsTnI results.  Refer to the "Links" section for chest pain algorithms and additional  guidance. Performed at [facility], [place] [address] [place], [place] [address]   Acetaminophen level     Status: Abnormal   Collection Time: 05/03/19  3:03 PM  Result Value Ref Range   Acetaminophen (Tylenol), Serum <10 (L) 10 - 30 ug/mL    Comment: (NOTE) Therapeutic concentrations vary significantly. A range of 10-30 ug/mL  may be an effective concentration for many patients. However, some  are best treated at concentrations outside of this range. Acetaminophen concentrations >150 ug/mL at 4 hours after ingestion  and >50 ug/mL at 12 hours after ingestion are often associated with  toxic reactions. Performed at [facility], [place] [address] [place], [place] [address]   Ethanol     Status: None   Collection Time: 05/03/19  3:03 PM  Result Value Ref Range   Alcohol, Ethyl (B) <10 <10 mg/dL    Comment: (NOTE) Lowest detectable limit for serum alcohol is 10 mg/dL. For medical purposes only. Performed at [facility], [place] [address] [place], [place] [address]   Urinalysis, Routine w reflex microscopic     Status: Abnormal   Collection Time: 05/03/19  4:09 PM  Result Value Ref Range   Color, Urine AMBER (A) YELLOW    Comment: BIOCHEMICALS MAY BE  AFFECTED BY COLOR   APPearance HAZY (A) CLEAR   Specific Gravity, Urine 1.013 1.005 - 1.030   pH 5.0 5.0 - 8.0    Glucose, UA NEGATIVE NEGATIVE mg/dL   Hgb urine dipstick SMALL (A) NEGATIVE   Bilirubin Urine NEGATIVE NEGATIVE   Ketones, ur NEGATIVE NEGATIVE mg/dL   Protein, ur 30 (A) NEGATIVE mg/dL   Nitrite NEGATIVE NEGATIVE   Leukocytes,Ua NEGATIVE NEGATIVE   RBC / HPF 0-5 0 - 5 RBC/hpf   WBC, UA 6-10 0 - 5 WBC/hpf   Bacteria, UA RARE (A) NONE SEEN   Squamous Epithelial / LPF 0-5 0 - 5   Mucus PRESENT     Comment: Performed at [facility], [place] [address], [place], [place] [address]  Hepatitis panel, acute     Status: None   Collection Time: 05/03/19  5:26 PM  Result Value Ref Range   Hepatitis B Surface Ag Negative Negative   HCV Ab <0.1 0.0 - 0.9 s/co ratio    Comment: (NOTE)                                  Negative:     < 0.8                             Indeterminate: 0.8 - 0.9                                  Positive:     > 0.9 The CDC recommends that a positive HCV antibody result be followed up with a HCV Nucleic Acid Amplification test (998338). Performed At: [facility] [place], [place] [id number] Rush Farmer MD [id number]    Hep A IgM Negative Negative   Hep B C IgM Negative Negative  Troponin I (High Sensitivity)     Status: Abnormal   Collection Time: 05/03/19  5:26 PM  Result Value Ref Range   Troponin I (High Sensitivity) 31 (H) <18 ng/L    Comment: (NOTE) Elevated high sensitivity troponin I (hsTnI) values and significant  changes across serial measurements may suggest ACS but many other  chronic and acute conditions are known to elevate hsTnI results.  Refer to the "Links" section for chest pain algorithms and additional  guidance. Performed at [facility], [place] [address] [place], [place] [address]   Lipase, blood     Status: Abnormal   Collection Time: 05/03/19  5:26 PM  Result Value Ref Range   Lipase 122 (H) 11 - 51 U/L    Comment: Performed at [facility], [place] [address], [place], [place] [address]  SARS  Coronavirus 2 [facility] order, Performed in [facility] hospital lab) Nasopharyngeal Nasopharyngeal Swab     Status: None   Collection Time: 05/03/19  6:36 PM   Specimen: Nasopharyngeal Swab  Result Value Ref Range   SARS Coronavirus 2 NEGATIVE NEGATIVE    Comment: (NOTE) If result is NEGATIVE SARS-CoV-2 target nucleic acids are NOT DETECTED. The SARS-CoV-2 RNA is generally detectable in upper and lower  respiratory specimens during the acute phase of infection. The lowest  concentration of SARS-CoV-2 viral copies this assay can detect is 250  copies / mL. A negative result does not preclude SARS-CoV-2 infection  and should not be used  as the sole basis for treatment or other  patient management decisions.  A negative result may occur with  improper specimen collection / handling, submission of specimen other  than nasopharyngeal swab, presence of viral mutation(s) within the  areas targeted by this assay, and inadequate number of viral copies  (<250 copies / mL). A negative result must be combined with clinical  observations, patient history, and epidemiological information. If result is POSITIVE SARS-CoV-2 target nucleic acids are DETECTED. The SARS-CoV-2 RNA is generally detectable in upper and lower  respiratory specimens dur ing the acute phase of infection.  Positive  results are indicative of active infection with SARS-CoV-2.  Clinical  correlation with patient history and other diagnostic information is  necessary to determine patient infection status.  Positive results do  not rule out bacterial infection or co-infection with other viruses. If result is PRESUMPTIVE POSTIVE SARS-CoV-2 nucleic acids MAY BE PRESENT.   A presumptive positive result was obtained on the submitted specimen  and confirmed on repeat testing.  While 2019 novel coronavirus  (SARS-CoV-2) nucleic acids may be present in the submitted sample  additional confirmatory testing may be necessary for  epidemiological  and / or clinical management purposes  to differentiate between  SARS-CoV-2 and other Sarbecovirus currently known to infect humans.  If clinically indicated additional testing with an alternate test  methodology [phone]) is advised. The SARS-CoV-2 RNA is generally  detectable in upper and lower respiratory sp ecimens during the acute  phase of infection. The expected result is Negative. Fact Sheet for Patients:  StrictlyIdeas.no Fact Sheet for Healthcare Providers: BankingDealers.co.za This test is not yet approved or cleared by the [place] FDA and has been authorized for detection and/or diagnosis of SARS-CoV-2 by FDA under an Emergency Use Authorization (EUA).  This EUA will remain in effect (meaning this test can be used) for the duration of the COVID-19 declaration under Section 564(b)(1) of the Act, 21 U.S.C. section 360bbb-3(b)(1), unless the authorization is terminated or revoked sooner. Performed at [facility], [place] [address], [place], [place] [address]   PSA     Status: None   Collection Time: 05/03/19  8:31 PM  Result Value Ref Range   Prostatic Specific Antigen 0.35 0.00 - 4.00 ng/mL    Comment: (NOTE) While PSA levels of <=4.0 ng/ml are reported as reference range, some men with levels below 4.0 ng/ml can have prostate cancer and many men with PSA above 4.0 ng/ml do not have prostate cancer.  Other tests such as free PSA, age specific reference ranges, PSA velocity and PSA doubling time may be helpful especially in men less than 58 years old. Performed at [facility], [place] [address], [place], [place] [address]   Glucose, capillary     Status: Abnormal   Collection Time: 05/03/19 10:34 PM  Result Value Ref Range   Glucose-Capillary 46 (L) 70 - 99 mg/dL  Hemoglobin A1c     Status: Abnormal   Collection Time: 05/03/19 10:44 PM  Result Value Ref Range   Hgb A1c MFr Bld 6.0 (H) 4.8  - 5.6 %    Comment: (NOTE) Pre diabetes:          5.7%-6.4% Diabetes:              >6.4% Glycemic control for   <7.0% adults with diabetes    Mean Plasma Glucose 125.5 mg/dL    Comment: Performed at [facility] [address], [place], [place]  [address]  Glucose, capillary     Status: None   Collection Time: 05/03/19 11:34 PM  Result Value Ref Range   Glucose-Capillary 84 70 - 99 mg/dL  CBC     Status: Abnormal   Collection Time: 05/04/19  2:55 AM  Result Value Ref Range   WBC 6.8 4.0 - 10.5 K/uL   RBC 3.95 (L) 4.22 - 5.81 MIL/uL   Hemoglobin 11.2 (L) 13.0 - 17.0 g/dL   HCT 35.1 (L) 39.0 - 52.0 %   MCV 88.9 80.0 - 100.0 fL   MCH 28.4 26.0 - 34.0 pg   MCHC 31.9 30.0 - 36.0 g/dL   RDW 14.1 11.5 - 15.5 %   Platelets 184 150 - 400 K/uL   nRBC 0.0 0.0 - 0.2 %    Comment: Performed at [facility], [place] [address], [place], [place] [address]  Comprehensive metabolic panel     Status: Abnormal   Collection Time: 05/04/19  2:55 AM  Result Value Ref Range   Sodium 138 135 - 145 mmol/L   Potassium 3.2 (L) 3.5 - 5.1 mmol/L   Chloride 107 98 - 111 mmol/L   CO2 18 (L) 22 - 32 mmol/L   Glucose, Bld 44 (LL) 70 - 99 mg/dL    Comment: CRITICAL RESULT CALLED TO, READ BACK BY AND VERIFIED WITH: Richrd Prime [phone] [id number]    BUN 28 (H) 8 - 23 mg/dL   Creatinine, Ser 1.79 (H) 0.61 - 1.24 mg/dL   Calcium 8.8 (L) 8.9 - 10.3 mg/dL   Total Protein 6.3 (L) 6.5 - 8.1 g/dL   Albumin 2.9 (L) 3.5 - 5.0 g/dL   AST 138 (H) 15 - 41 U/L   ALT 196 (H) 0 - 44 U/L   Alkaline Phosphatase 505 (H) 38 - 126 U/L   Total Bilirubin 5.6 (H) 0.3 - 1.2 mg/dL   GFR calc non Af Amer 33 (L) >60 mL/min   GFR calc Af Amer 38 (L) >60 mL/min   Anion gap 13 5 - 15    Comment: Performed at [facility], [place] [address] [place], [place] [address]  Lipase, blood     Status: Abnormal   Collection Time: 05/04/19  2:55 AM  Result Value Ref Range   Lipase 125 (H) 11 - 51 U/L    Comment: Performed  at [facility] [address] [place], [place] [address]  Glucose, capillary     Status: Abnormal   Collection Time: 05/04/19  7:03 AM  Result Value Ref Range   Glucose-Capillary 58 (L) 70 - 99 mg/dL  Glucose, capillary     Status: Abnormal   Collection Time: 05/04/19  7:47 AM  Result Value Ref Range   Glucose-Capillary 111 (H) 70 - 99 mg/dL    Ct Abdomen Pelvis Wo Contrast  Result Date: 05/03/2019 CLINICAL DATA:  Transaminitis EXAM: CT ABDOMEN AND PELVIS WITHOUT CONTRAST TECHNIQUE: Multidetector CT imaging of the abdomen and pelvis was performed following the standard protocol without IV contrast. COMPARISON:  None. FINDINGS: Lower chest: There is a partially visualized 6 mm pulmonary nodule in the left lower lobe (axial series 3, image 1). There is an additional opacity in the left lower lobe measuring approximately 7 mm (axial series 3, image 9). There is mucous plugging and bronchiectasis involving the medial right lung base.The heart size is normal. Hepatobiliary: There are multiple indeterminate hypoattenuating masses in the liver most notably within the right hepatic lobe. These measure  up to approximately 1.6 cm. Multiple gallstones versus gallbladder sludge is noted. The common bile duct is mildly dilated measuring up to approximately 9 mm proximally. There appears to be some intrahepatic biliary ductal dilatation. Pancreas: There is an ill-defined soft tissue density at the pancreatic head. This is not well characterized in the absence of IV contrast. This measures approximately 3.5 x 3.8 cm (axial series 3, image 30). Spleen: No splenic laceration or hematoma. Adrenals/Urinary Tract: --Adrenal glands: No adrenal hemorrhage. --Right kidney/ureter: Multiple right-sided renal cysts are noted. There is no hydronephrosis. --Left kidney/ureter: No hydronephrosis or perinephric hematoma. --Urinary bladder: Unremarkable. Stomach/Bowel: --Stomach/Duodenum: There is a small hiatal hernia.  --Small bowel: No dilatation or inflammation. --Colon: The patient is status post partial colectomy. A surgical anastomosis is noted at the level of the hepatic flexure. There is no evidence for an obstruction. --Appendix: The appendix is likely surgically absent. Vascular/Lymphatic: Atherosclerotic calcification is present within the non-aneurysmal abdominal aorta, without hemodynamically significant stenosis. --No retroperitoneal lymphadenopathy. --No mesenteric lymphadenopathy. --No pelvic or inguinal lymphadenopathy. Reproductive: Unremarkable Other: No ascites or free air. There is a right fat containing inguinal hernia. Musculoskeletal. There is a somewhat irregular appearance of the inferior endplate of the L3 vertebral body. This is favored to represent an atypical Schmorl's node. Degenerative changes are noted throughout the visualized lumbar spine. There is grade 1 anterolisthesis of L4 on L5. There is no acute osseous abnormality. IMPRESSION: 1. Distended gallbladder with evidence of gallbladder sludge versus cholelithiasis. There is probable intrahepatic and extrahepatic biliary ductal dilatation which is not well evaluated in the absence of IV contrast. If there is clinical concern for an obstructing process such as choledocholithiasis, follow-up with MRCP/ERCP is recommended. There is no CT evidence for acute cholecystitis. 2. Multiple small hypoattenuating masses throughout the liver as detailed above. These are incompletely characterized on this exam. Follow-up with a contrast-enhanced MRI is recommended. 3. Somewhat ill-defined soft tissue density at the level of the pancreatic head. This is not well evaluated in the absence of IV contrast. Follow-up with a contrast-enhanced MRI is recommended. 4. Status post partial colectomy.  No evidence of an obstruction. 5. Multiple pulmonary nodules in the left lower lobe measuring up to approximately 7 mm. Non-contrast chest CT at 3-6 months is recommended.  If the nodules are stable at time of repeat CT, then future CT at 18-24 months (from today's scan) is considered optional for low-risk patients, but is recommended for high-risk patients. This recommendation follows the consensus statement: Guidelines for Management of Incidental Pulmonary Nodules Detected on CT Images: From the Fleischner Society 2017; Radiology 2017; 284:228-243. Electronically Signed   By: Constance Holster M.D.   On: 05/03/2019 18:27   Dg Chest 2 View  Result Date: 05/03/2019 CLINICAL DATA:  Fall. EXAM: CHEST - 2 VIEW COMPARISON:  August 13, 2015 FINDINGS: The heart size and mediastinal contours are within normal limits. Both lungs are clear. The visualized skeletal structures are unremarkable. IMPRESSION: No active cardiopulmonary disease. Electronically Signed   By: Dorise Bullion III M.D   On: 05/03/2019 14:13   Ct Head Wo Contrast  Result Date: 05/03/2019 CLINICAL DATA:  Weakness with recent fall.  No known head injury. EXAM: CT HEAD WITHOUT CONTRAST TECHNIQUE: Contiguous axial images were obtained from the base of the skull through the vertex without intravenous contrast. COMPARISON:  MRI brain 09/22/2013. FINDINGS: Brain: There is no evidence of acute intracranial hemorrhage, mass lesion, brain edema or extra-axial fluid collection. There is stable atrophy with mild  prominence of the ventricles and subarachnoid spaces. There are mild chronic small vessel ischemic changes within the periventricular white matter which are similar to previous MRI. There is no CT evidence of acute cortical infarction. Vascular: Prominent intracranial vascular calcifications. No hyperdense vessel identified. Skull: Negative for fracture or focal lesion. Sinuses/Orbits: The visualized paranasal sinuses and mastoid air cells are clear. No orbital abnormalities are seen. Other: None. IMPRESSION: Stable atrophy and mild chronic small vessel ischemic changes. No acute intracranial findings. Electronically  Signed   By: Richardean Sale M.D.   On: 05/03/2019 15:58   Dg Knee Complete 4 Views Left  Result Date: 05/03/2019 CLINICAL DATA:  Pain after fall EXAM: LEFT KNEE - COMPLETE 4+ VIEW COMPARISON:  None. FINDINGS: Patient is status post left knee replacement. Hardware is in good position. No fracture or dislocation identified. No effusions. Vascular calcifications noted. IMPRESSION: No acute abnormalities. Electronically Signed   By: Dorise Bullion III M.D   On: 05/03/2019 14:14   Dg Hip Unilat W Or Wo Pelvis 2-3 Views Left  Result Date: 05/03/2019 CLINICAL DATA:  Left hip pain post fall. EXAM: DG HIP (WITH OR WITHOUT PELVIS) 2-3V LEFT COMPARISON:  None. FINDINGS: There is no evidence of hip fracture or dislocation. Mild osteoarthritic changes of bilateral hips. Vascular calcifications noted. IMPRESSION: No acute fracture or dislocation identified about the left hip. Electronically Signed   By: Fidela Salisbury M.D.   On: 05/03/2019 14:14    ROS negative except above Blood pressure (!) 163/62, pulse 60, temperature (!) 97.3 F (36.3 C), temperature source Oral, resp. rate 16, height 6\' 4"  (1.93 m), weight 101.9 kg, SpO2 100 %. Physical Exam vital signs stable afebrile no acute distress patient sitting comfortably in the bed exam pertinent for his abdomen being soft nontender good bowel sounds labs and CT reviewed  Assessment/Plan: Probably obstructive jaundice questionable etiology CT worrisome for pancreatic mass Plan: Agree with MRI and MRCP and probably will need ERCP based on those findings and question whether he needs two aspirin a day and Lovenox as well while here in the hospital but will leave to primary team  [facility] 05/04/2019, 10:12 AM

## 2019-05-05 ENCOUNTER — Encounter (HOSPITAL_COMMUNITY)
Admission: EM | Disposition: A | Discharge status: Home Health | Payer: Self-pay | Source: Home / Self Care | Attending: Internal Medicine

## 2019-05-05 ENCOUNTER — Inpatient Hospital Stay (HOSPITAL_COMMUNITY): Payer: Medicare Other

## 2019-05-05 ENCOUNTER — Encounter (HOSPITAL_COMMUNITY): Payer: Self-pay | Admitting: Gastroenterology

## 2019-05-05 ENCOUNTER — Inpatient Hospital Stay (HOSPITAL_COMMUNITY): Payer: Medicare Other | Admitting: Anesthesiology

## 2019-05-05 DIAGNOSIS — R627 Adult failure to thrive: Secondary | ICD-10-CM

## 2019-05-05 DIAGNOSIS — I5032 Chronic diastolic (congestive) heart failure: Secondary | ICD-10-CM

## 2019-05-05 HISTORY — PX: SPHINCTEROTOMY: SHX5544

## 2019-05-05 HISTORY — PX: BILIARY STENT PLACEMENT: SHX5538

## 2019-05-05 HISTORY — PX: BILIARY BRUSHING: SHX6843

## 2019-05-05 HISTORY — PX: PANCREATIC STENT PLACEMENT: SHX5539

## 2019-05-05 HISTORY — PX: ENDOSCOPIC RETROGRADE CHOLANGIOPANCREATOGRAPHY (ERCP) WITH PROPOFOL: SHX5810

## 2019-05-05 LAB — BASIC METABOLIC PANEL
Anion gap: 9 (ref 5–15)
BUN: 22 mg/dL (ref 8–23)
CO2: 19 mmol/L — ABNORMAL LOW (ref 22–32)
Calcium: 8.7 mg/dL — ABNORMAL LOW (ref 8.9–10.3)
Chloride: 108 mmol/L (ref 98–111)
Creatinine, Ser: 1.64 mg/dL — ABNORMAL HIGH (ref 0.61–1.24)
GFR calc Af Amer: 42 mL/min — ABNORMAL LOW (ref >60)
GFR calc non Af Amer: 37 mL/min — ABNORMAL LOW (ref >60)
Glucose, Bld: 138 mg/dL — ABNORMAL HIGH (ref 70–99)
Potassium: 3.6 mmol/L (ref 3.5–5.1)
Sodium: 136 mmol/L (ref 135–145)

## 2019-05-05 LAB — CANCER ANTIGEN 19-9: CA 19-9: 502 U/mL — ABNORMAL HIGH (ref 0–35)

## 2019-05-05 LAB — HEPATIC FUNCTION PANEL
ALT: 181 U/L — ABNORMAL HIGH (ref 0–44)
AST: 118 U/L — ABNORMAL HIGH (ref 15–41)
Albumin: 2.9 g/dL — ABNORMAL LOW (ref 3.5–5.0)
Alkaline Phosphatase: 530 U/L — ABNORMAL HIGH (ref 38–126)
Bilirubin, Direct: 3.3 mg/dL — ABNORMAL HIGH (ref 0.0–0.2)
Indirect Bilirubin: 2.5 mg/dL — ABNORMAL HIGH (ref 0.3–0.9)
Total Bilirubin: 5.8 mg/dL — ABNORMAL HIGH (ref 0.3–1.2)
Total Protein: 5.8 g/dL — ABNORMAL LOW (ref 6.5–8.1)

## 2019-05-05 LAB — GLUCOSE, CAPILLARY
Glucose-Capillary: 100 mg/dL — ABNORMAL HIGH (ref 70–99)
Glucose-Capillary: 96 mg/dL (ref 70–99)
Glucose-Capillary: 186 mg/dL — ABNORMAL HIGH (ref 70–99)
Glucose-Capillary: 266 mg/dL — ABNORMAL HIGH (ref 70–99)

## 2019-05-05 LAB — CEA: CEA: 240 ng/mL — ABNORMAL HIGH (ref 0.0–4.7)

## 2019-05-05 SURGERY — ENDOSCOPIC RETROGRADE CHOLANGIOPANCREATOGRAPHY (ERCP) WITH PROPOFOL
Anesthesia: General

## 2019-05-05 MED ORDER — ROCURONIUM BROMIDE 10 MG/ML (PF) SYRINGE
PREFILLED_SYRINGE | INTRAVENOUS | Status: DC | PRN
  Administered 2019-05-05: 70 mg via INTRAVENOUS
  Administered 2019-05-05: 20 mg via INTRAVENOUS

## 2019-05-05 MED ORDER — SODIUM CHLORIDE 0.9 % IV SOLN
INTRAVENOUS | Status: DC
  Administered 2019-05-06: via INTRAVENOUS

## 2019-05-05 MED ORDER — SODIUM CHLORIDE 0.9 % IV SOLN: INTRAVENOUS | Status: DC

## 2019-05-05 MED ORDER — INDOMETHACIN 50 MG RE SUPP
RECTAL | Status: DC | PRN
  Administered 2019-05-05: 100 mg via RECTAL

## 2019-05-05 MED ORDER — LIDOCAINE 2% (20 MG/ML) 5 ML SYRINGE
INTRAMUSCULAR | Status: DC | PRN
  Administered 2019-05-05: 60 mg via INTRAVENOUS

## 2019-05-05 MED ORDER — FENTANYL CITRATE (PF) 100 MCG/2ML IJ SOLN
INTRAMUSCULAR | Status: DC | PRN
  Administered 2019-05-05: 50 ug via INTRAVENOUS

## 2019-05-05 MED ORDER — ONDANSETRON HCL 4 MG/2ML IJ SOLN
INTRAMUSCULAR | Status: DC | PRN
  Administered 2019-05-05: 4 mg via INTRAVENOUS

## 2019-05-05 MED ORDER — INDOMETHACIN 50 MG RE SUPP
RECTAL | Status: AC
  Filled 2019-05-05: qty 2

## 2019-05-05 MED ORDER — LACTATED RINGERS IV SOLN
INTRAVENOUS | Status: DC | PRN
  Administered 2019-05-05: 13:00:00 via INTRAVENOUS

## 2019-05-05 MED ORDER — GLUCAGON HCL RDNA (DIAGNOSTIC) 1 MG IJ SOLR
INTRAMUSCULAR | Status: AC
  Filled 2019-05-05: qty 1

## 2019-05-05 MED ORDER — DEXAMETHASONE SODIUM PHOSPHATE 10 MG/ML IJ SOLN
INTRAMUSCULAR | Status: DC | PRN
  Administered 2019-05-05: 4 mg via INTRAVENOUS

## 2019-05-05 MED ORDER — PROPOFOL 10 MG/ML IV BOLUS
INTRAVENOUS | Status: DC | PRN
  Administered 2019-05-05: 130 mg via INTRAVENOUS

## 2019-05-05 MED ORDER — SUGAMMADEX SODIUM 200 MG/2ML IV SOLN
INTRAVENOUS | Status: DC | PRN
  Administered 2019-05-05: 400 mg via INTRAVENOUS

## 2019-05-05 MED ORDER — DEXTROSE 50 % IV SOLN: 1.0000 | INTRAVENOUS | Status: DC | PRN

## 2019-05-05 MED ORDER — FENTANYL CITRATE (PF) 100 MCG/2ML IJ SOLN
INTRAMUSCULAR | Status: AC
  Filled 2019-05-05: qty 2

## 2019-05-05 MED ORDER — CIPROFLOXACIN IN D5W 400 MG/200ML IV SOLN
INTRAVENOUS | Status: AC
  Filled 2019-05-05: qty 200

## 2019-05-05 MED ORDER — CIPROFLOXACIN IN D5W 400 MG/200ML IV SOLN
INTRAVENOUS | Status: DC | PRN
  Administered 2019-05-05: 400 mg via INTRAVENOUS

## 2019-05-05 MED ORDER — SODIUM CHLORIDE 0.9 % IV SOLN
INTRAVENOUS | Status: DC | PRN
  Administered 2019-05-05: 10 mL

## 2019-05-05 MED ORDER — HYDROCHLOROTHIAZIDE 25 MG PO TABS: 25.0000 mg | ORAL_TABLET | Freq: Every day | ORAL | Status: DC

## 2019-05-05 NOTE — Anesthesia Postprocedure Evaluation (Signed)
Anesthesia Post Note  Patient: Damon Snyder.  Procedure(s) Performed: ENDOSCOPIC RETROGRADE CHOLANGIOPANCREATOGRAPHY (ERCP) WITH PROPOFOL (N/A ) SPHINCTEROTOMY BILIARY BRUSHING BILIARY STENT PLACEMENT     Patient location during evaluation: PACU Anesthesia Type: General Level of consciousness: awake and alert Pain management: pain level controlled Vital Signs Assessment: post-procedure vital signs reviewed and stable Respiratory status: spontaneous breathing, nonlabored ventilation, respiratory function stable and patient connected to nasal cannula oxygen Cardiovascular status: blood pressure returned to baseline and stable Postop Assessment: no apparent nausea or vomiting Anesthetic complications: no    Last Vitals:  Vitals:   05/05/19 1434 05/05/19 1440  BP:  (!) 151/47  Pulse:  (!) 53  Resp:  14  Temp:    SpO2: 100% 100%    Last Pain:  Vitals:   05/05/19 1440  TempSrc:   PainSc: 0-No pain                 Caulder Wehner DANIEL

## 2019-05-05 NOTE — Transfer of Care (Signed)
Immediate Anesthesia Transfer of Care Note  Patient: Damon Snyder.  Procedure(s) Performed: ENDOSCOPIC RETROGRADE CHOLANGIOPANCREATOGRAPHY (ERCP) WITH PROPOFOL (N/A ) SPHINCTEROTOMY BILIARY BRUSHING BILIARY STENT PLACEMENT  Patient Location: Endoscopy Unit  Anesthesia Type:General  Level of Consciousness: drowsy and patient cooperative  Airway & Oxygen Therapy: Patient Spontanous Breathing and Patient connected to nasal cannula oxygen  Post-op Assessment: Report given to RN, Post -op Vital signs reviewed and stable and Patient moving all extremities  Post vital signs: Reviewed and stable  Last Vitals:  Vitals Value Taken Time  BP    Temp    Pulse    Resp    SpO2      Last Pain:  Vitals:   05/05/19 1310  TempSrc:   PainSc: Asleep      Patients Stated Pain Goal: 2 (09/15/40 1464)  Complications: No apparent anesthesia complications

## 2019-05-05 NOTE — Anesthesia Preprocedure Evaluation (Addendum)
Anesthesia Evaluation  Patient identified by MRN, date of birth, ID band Patient awake    Reviewed: Allergy & Precautions, NPO status , Patient's Chart, lab work & pertinent test results  History of Anesthesia Complications Negative for: history of anesthetic complications  Airway Mallampati: I  TM Distance: >3 FB Neck ROM: Full    Dental  (+) Edentulous Upper, Dental Advisory Given   Pulmonary neg pulmonary ROS,    Pulmonary exam normal        Cardiovascular hypertension, + Peripheral Vascular Disease  Normal cardiovascular exam     Neuro/Psych negative neurological ROS     GI/Hepatic negative GI ROS, Neg liver ROS,   Endo/Other  diabetesPancreatic mass  Renal/GU negative Renal ROS     Musculoskeletal  (+) Arthritis ,   Abdominal   Peds  Hematology negative hematology ROS (+)   Anesthesia Other Findings Day of surgery medications reviewed with the patient.  Reproductive/Obstetrics                            Anesthesia Physical Anesthesia Plan  ASA: III  Anesthesia Plan: General   Post-op Pain Management:    Induction: Intravenous  PONV Risk Score and Plan: 2 and Ondansetron and Dexamethasone  Airway Management Planned: Oral ETT  Additional Equipment:   Intra-op Plan:   Post-operative Plan: Extubation in OR  Informed Consent: I have reviewed the patients History and Physical, chart, labs and discussed the procedure including the risks, benefits and alternatives for the proposed anesthesia with the patient or authorized representative who has indicated his/her understanding and acceptance.     Dental advisory given  Plan Discussed with: Anesthesiologist and CRNA  Anesthesia Plan Comments:        Anesthesia Quick Evaluation

## 2019-05-05 NOTE — Anesthesia Procedure Notes (Signed)
Procedure Name: Intubation Date/Time: 05/05/2019 1:27 PM Performed by: Leonor Liv, CRNA Pre-anesthesia Checklist: Patient identified, Emergency Drugs available, Suction available and Patient being monitored Patient Re-evaluated:Patient Re-evaluated prior to induction Oxygen Delivery Method: Circle System Utilized Preoxygenation: Pre-oxygenation with 100% oxygen Induction Type: IV induction Ventilation: Mask ventilation without difficulty Laryngoscope Size: Mac and 4 Grade View: Grade I Tube type: Oral Tube size: 7.5 mm Number of attempts: 1 Airway Equipment and Method: Stylet and Oral airway Placement Confirmation: ETT inserted through vocal cords under direct vision,  positive ETCO2 and breath sounds checked- equal and bilateral Secured at: 23 cm Tube secured with: Tape Dental Injury: Teeth and Oropharynx as per pre-operative assessment

## 2019-05-05 NOTE — Progress Notes (Signed)
[facility]69 HR 69 CBg 266 ifnormed patient has D5NS @100  ml/hr running. Arthor Captain LPN

## 2019-05-05 NOTE — Op Note (Signed)
[facility] Patient Name: Damon Snyder Procedure Date : 05/05/2019 MRN: [id number] Attending MD: Clarene Essex , MD Date of Birth: 1928-05-06 CSN: [id number] Age: 85 Admit Type: Outpatient Procedure:                ERCP Indications:              Abnormal MRCP, Elevated liver enzymes, Tumor of the                            head of pancreas probably malignant Providers:                Clarene Essex, MD, Carlyn Reichert, RN, Elspeth Cho                            Tech., Technician Referring MD:              Medicines:                General Anesthesia Complications:            No immediate complications. Estimated Blood Loss:     Estimated blood loss was minimal only seen with                            above brushing. Procedure:                Pre-Anesthesia Assessment:                           - Prior to the procedure, a History and Physical                            was performed, and patient medications and                            allergies were reviewed. The patient's tolerance of                            previous anesthesia was also reviewed. The risks                            and benefits of the procedure and the sedation                            options and risks were discussed with the patient.                            All questions were answered, and informed consent                            was obtained. Prior Anticoagulants: The patient has                            taken no previous anticoagulant or antiplatelet                            agents  except for aspirin. ASA Grade Assessment:                            III - A patient with severe systemic disease. After                            reviewing the risks and benefits, the patient was                            deemed in satisfactory condition to undergo the                            procedure.                           After obtaining informed consent, the scope was   passed under direct vision. Throughout the                            procedure, the patient's blood pressure, pulse, and                            oxygen saturations were monitored continuously. The                            [id number]-[id number] ([id number] Olympus Duodensocope was                            introduced through the mouth, and used to inject                            contrast into and used to locate the major papilla.                            The ERCP was accomplished without difficulty. The                            patient tolerated the procedure well. Scope In: Scope Out: Findings:      The major papilla was edematous. On initial cannulation deep selective       cannulation of the pancreatic duct was obtained but no dye was injected       and we placed one 4 Fr by 5 cm plastic stent with a 3/4 external pigtail       was placed 4 cm into the ventral pancreatic duct. Clear fluid flowed       through the stent. The stent was in good position. Deep selective CBD       cannulation was then readily obtained next to the PD stent and an       obvious distal to mid stricture was seen and we proceeded with a biliary       sphincterotomy which was made with a Hydratome sphincterotome using ERBE       electrocautery. There was no post-sphincterotomy bleeding. We proceeded       with the sphincterotomy until we had adequate biliary drainage and we  then proceeded with brushing cells for cytology which were obtained by       brushing in the lower third and middle of the main bile duct. We then       placed one 10 Fr by 6 cm uncovered metal stent which was placed 5.5 cm       into the common bile duct. Bile flowed through the stent. The stent was       in good position. The introducer and wire were removed and the patient       tolerated the procedure well Impression:               - The major papilla appeared edematous.                           - One plastic stent was placed into the  ventral                            pancreatic duct.                           - A biliary sphincterotomy was performed.                           - Cells for cytology obtained in the lower third of                            the main duct.                           - One uncovered metal stent was placed into the                            common bile duct. Moderate Sedation:      moderate sedation-none Recommendation:           - Clear liquid diet for 6 hours. If doing well may                            have soft solids later this evening or tomorrow                           - Continue present medications.                           - Await cytology results. If nondiagnostic consider                            CT directed liver biopsy next                           - Return to GI clinic PRN.                           - Telephone GI clinic for pathology results in 4  days. Will need abdominal x-ray flat and upright in                            1 week to confirm passing of pancreatic duct stent                            and if still present would repeat an x-ray in 1                            more week and if still present at that time will                            need endoscopic removal- Telephone GI clinic if                            symptomatic PRN. Oncology consult recommended Procedure Code(s):        --- Professional ---                           [id number], Esophagogastroduodenoscopy, flexible,                            transoral; diagnostic, including collection of                            specimen(s) by brushing or washing, when performed                            (separate procedure) Diagnosis Code(s):        --- Professional ---                           R74.8, Abnormal levels of other serum enzymes                           D49.0, Neoplasm of unspecified behavior of                            digestive system                           K83.8,  Other specified diseases of biliary tract                           R93.2, Abnormal findings on diagnostic imaging of                            liver and biliary tract CPT copyright 2019 [employer]. All rights reserved. The codes documented in this report are preliminary and upon coder review may  be revised to meet current compliance requirements. Clarene Essex, MD 05/05/2019 2:26:27 PM This report has been signed electronically. Number of Addenda: 0

## 2019-05-05 NOTE — Progress Notes (Signed)
PROGRESS NOTE    Damon Snyder.  FUX:[id number] DOB: Apr 09, 1928 DOA: 05/03/2019 PCP: Mayra Neer, MD      Brief Narrative:  Damon Snyder is a 85 y.o. M with HTN, hx colon cancer with colectomy, chemo, hx prostate cacner and IDDM who presented with fall, generalized weakness and weigh loss.  In the ER, CT abdomen showed a pancreatic head mass, transaminitis.  He was admitted for work up.      Assessment & Plan:  Pancreatic head mass, suspect malignancy with metastasis MRCP confirmed pancreatic head mass.  GI performed ERCP today and placed biliary stent.  -Advance to soft diet in AM -Defer CT chest to outpatient setting to monitor if Cr improves to previous baseline   -Follow up cytology results   Hypertension BP elevated -Continue amlodipine, Bystolic -Resume HCTZ  Diabetes Glucoses normal -Continue SSI corrections -Continue aspirin, statin -Hold home Lantus due to low glucose at admission  Hypokaleima Resolved  AKI Creatinine 2.12 mg/dL at admission, improved to 1.6 with fluids.  Baseline 1.4. -Repeat BMP in 1 week    MDM and disposition: The below labs and imaging reports were reviewed and summarized above.  Medication management as above.  The patient was admitted with new pancreatic head mass.  This has been imaged with MRI.  He had obstructive jaundice and so he underwent ERCP and stenting as well as brushings for cytology.    If he is able to tolerate PO diet, and Cr stable tomorrow, likely home Tuesday.  GI recommend outpatient follow up with their office by phone in 4 days for biopsy results.  They also recommend close follow up with Oncology, which will need to be arranged before discharge.        Code Status: FULL Family Communication:     Consultants:   GI  Procedures:   ERCP 8/10 Findings:      The major papilla was edematous. On initial cannulation deep selective       cannulation of the pancreatic duct was obtained but no  dye was injected       and we placed one 4 Fr by 5 cm plastic stent with a 3/4 external pigtail       was placed 4 cm into the ventral pancreatic duct. Clear fluid flowed       through the stent. The stent was in good position. Deep selective CBD       cannulation was then readily obtained next to the PD stent and an       obvious distal to mid stricture was seen and we proceeded with a biliary       sphincterotomy which was made with a Hydratome sphincterotome using ERBE       electrocautery. There was no post-sphincterotomy bleeding. We proceeded       with the sphincterotomy until we had adequate biliary drainage and we       then proceeded with brushing cells for cytology which were obtained by       brushing in the lower third and middle of the main bile duct. We then       placed one 10 Fr by 6 cm uncovered metal stent which was placed 5.5 cm       into the common bile duct. Bile flowed through the stent. The stent was       in good position. The introducer and wire were removed and the patient       tolerated the procedure  well Impression:               - The major papilla appeared edematous.                           - One plastic stent was placed into the ventral                            pancreatic duct.                           - A biliary sphincterotomy was performed.                           - Cells for cytology obtained in the lower third of                            the main duct.                           - One uncovered metal stent was placed into the                            common bile duct. Moderate Sedation:      moderate sedation-none Recommendation:           - Clear liquid diet for 6 hours. If doing well may                            have soft solids later this evening or tomorrow                           - Continue present medications.                           - Await cytology results. If nondiagnostic consider                            CT directed  liver biopsy next                           - Return to GI clinic PRN.                           - Telephone GI clinic for pathology results in 4                            days. Will need abdominal x-ray flat and upright in                            1 week to confirm passing of pancreatic duct stent                            and if still present would repeat an x-ray in 1  more week and if still present at that time will                            need endoscopic removal- Telephone GI clinic if                            symptomatic PRN. Oncology consult recommended        Subjective: Feeling tired. Weak.  No fever, headaceh.  No appetite.  No confusion.  No vomiting, no abdominal pain.  Objective: Vitals:   05/05/19 1430 05/05/19 1434 05/05/19 1440 05/05/19 1515  BP: (!) 166/53  (!) 151/47 (!) 158/62  Pulse: (!) 54  (!) 53 (!) 58  Resp: 11  14 16   Temp: 98.1 F (36.7 C)   98 F (36.7 C)  TempSrc: Oral     SpO2: 100% 100% 100% 100%  Weight:      Height:        Intake/Output Summary (Last 24 hours) at 05/05/2019 1909 Last data filed at 05/05/2019 1712 Gross per 24 hour  Intake 1442.15 ml  Output 1681 ml  Net -238.85 ml   Filed Weights   05/03/19 2230  Weight: 101.9 kg    Examination: General appearance:  adult male, alert and in no acute distress.   HEENT: mild icterus, conjunctiva pink, lids and lashes normal. No nasal deformity, discharge, epistaxis.  Lips moist, dentition normal, OP dry.   Skin: Warm and dry.  no jaundice.  No suspicious rashes or lesions. Cardiac: RRR, nl S1-S2, no murmurs appreciated.  Capillary refill is brisk.  JVP normal.  No LE edema.  Radia  pulses 2+ and symmetric. Respiratory: Normal respiratory rate and rhythm.  CTAB without rales or wheezes. Abdomen: Abdomen soft.  No TTP or guarding. No ascites, distension, hepatosplenomegaly.   MSK: No deformities or effusions. Neuro: Awake and alert.  EOMI, moves all  extremities. Speech fluent.    Psych: Sensorium intact and responding to questions, attention normal. Affect normal.  Judgment and insight appear normal.    Data Reviewed: I have personally reviewed following labs and imaging studies:  CBC: Recent Labs  Lab 05/03/19 1503 05/04/19 0255  WBC 6.2 6.8  NEUTROABS 4.6  --   HGB 12.1* 11.2*  HCT 37.7* 35.1*  MCV 89.1 88.9  PLT 196 315   Basic Metabolic Panel: Recent Labs  Lab 05/03/19 1503 05/04/19 0255 05/05/19 0225  NA 137 138 136  K 3.4* 3.2* 3.6  CL 106 107 108  CO2 19* 18* 19*  GLUCOSE 106* 44* 138*  BUN 33* 28* 22  CREATININE 2.12* 1.79* 1.64*  CALCIUM 9.3 8.8* 8.7*   GFR: Estimated Creatinine Clearance: 37.5 mL/min (A) (by C-G formula based on SCr of 1.64 mg/dL (H)). Liver Function Tests: Recent Labs  Lab 05/03/19 1503 05/04/19 0255 05/05/19 0225  AST 154* 138* 118*  ALT 221* 196* 181*  ALKPHOS 588* 505* 530*  BILITOT 5.9* 5.6* 5.8*  PROT 7.3 6.3* 5.8*  ALBUMIN 3.4* 2.9* 2.9*   Recent Labs  Lab 05/03/19 1726 05/04/19 0255  LIPASE 122* 125*   No results for input(s): AMMONIA in the last 168 hours. Coagulation Profile: No results for input(s): INR, PROTIME in the last 168 hours. Cardiac Enzymes: No results for input(s): CKTOTAL, CKMB, CKMBINDEX, TROPONINI in the last 168 hours. BNP (last 3 results) No results for input(s): PROBNP in the last 8760 hours. HbA1C: Recent  Labs    05/03/19 2244  HGBA1C 6.0*   CBG: Recent Labs  Lab 05/04/19 1733 05/04/19 2014 05/05/19 0749 05/05/19 1200 05/05/19 1700  GLUCAP 89 170* 100* 96 186*   Lipid Profile: No results for input(s): CHOL, HDL, LDLCALC, TRIG, CHOLHDL, LDLDIRECT in the last 72 hours. Thyroid Function Tests: No results for input(s): TSH, T4TOTAL, FREET4, T3FREE, THYROIDAB in the last 72 hours. Anemia Panel: No results for input(s): VITAMINB12, FOLATE, FERRITIN, TIBC, IRON, RETICCTPCT in the last 72 hours. Urine analysis:    Component Value  Date/Time   COLORURINE AMBER (A) 05/03/2019 1609   APPEARANCEUR HAZY (A) 05/03/2019 1609   LABSPEC 1.013 05/03/2019 1609   PHURINE 5.0 05/03/2019 1609   GLUCOSEU NEGATIVE 05/03/2019 1609   HGBUR SMALL (A) 05/03/2019 1609   BILIRUBINUR NEGATIVE 05/03/2019 1609   KETONESUR NEGATIVE 05/03/2019 1609   PROTEINUR 30 (A) 05/03/2019 1609   UROBILINOGEN 0.2 09/22/2013 1519   NITRITE NEGATIVE 05/03/2019 1609   LEUKOCYTESUR NEGATIVE 05/03/2019 1609   Sepsis Labs: @LABRCNTIP (procalcitonin:4,lacticacidven:4)  ) Recent Results (from the past 240 hour(s))  SARS Coronavirus 2 [facility] order, Performed in [facility] hospital lab) Nasopharyngeal Nasopharyngeal Swab     Status: None   Collection Time: 05/03/19  6:36 PM   Specimen: Nasopharyngeal Swab  Result Value Ref Range Status   SARS Coronavirus 2 NEGATIVE NEGATIVE Final    Comment: (NOTE) If result is NEGATIVE SARS-CoV-2 target nucleic acids are NOT DETECTED. The SARS-CoV-2 RNA is generally detectable in upper and lower  respiratory specimens during the acute phase of infection. The lowest  concentration of SARS-CoV-2 viral copies this assay can detect is 250  copies / mL. A negative result does not preclude SARS-CoV-2 infection  and should not be used as the sole basis for treatment or other  patient management decisions.  A negative result may occur with  improper specimen collection / handling, submission of specimen other  than nasopharyngeal swab, presence of viral mutation(s) within the  areas targeted by this assay, and inadequate number of viral copies  (<250 copies / mL). A negative result must be combined with clinical  observations, patient history, and epidemiological information. If result is POSITIVE SARS-CoV-2 target nucleic acids are DETECTED. The SARS-CoV-2 RNA is generally detectable in upper and lower  respiratory specimens dur ing the acute phase of infection.  Positive  results are indicative of active infection  with SARS-CoV-2.  Clinical  correlation with patient history and other diagnostic information is  necessary to determine patient infection status.  Positive results do  not rule out bacterial infection or co-infection with other viruses. If result is PRESUMPTIVE POSTIVE SARS-CoV-2 nucleic acids MAY BE PRESENT.   A presumptive positive result was obtained on the submitted specimen  and confirmed on repeat testing.  While 2019 novel coronavirus  (SARS-CoV-2) nucleic acids may be present in the submitted sample  additional confirmatory testing may be necessary for epidemiological  and / or clinical management purposes  to differentiate between  SARS-CoV-2 and other Sarbecovirus currently known to infect humans.  If clinically indicated additional testing with an alternate test  methodology [phone]) is advised. The SARS-CoV-2 RNA is generally  detectable in upper and lower respiratory sp ecimens during the acute  phase of infection. The expected result is Negative. Fact Sheet for Patients:  StrictlyIdeas.no Fact Sheet for Healthcare Providers: BankingDealers.co.za This test is not yet approved or cleared by the [place] FDA and has been authorized for detection and/or diagnosis of SARS-CoV-2 by FDA  under an Emergency Use Authorization (EUA).  This EUA will remain in effect (meaning this test can be used) for the duration of the COVID-19 declaration under Section 564(b)(1) of the Act, 21 U.S.C. section 360bbb-3(b)(1), unless the authorization is terminated or revoked sooner. Performed at [facility], [place] [address], [place], [place] [address]          Radiology Studies: Mr 3d Recon At Scanner  Result Date: 05/04/2019 CLINICAL DATA:  Abnormal liver function tests. CT demonstrating possible pancreatic and liver lesions. EXAM: MRI ABDOMEN WITHOUT AND WITH CONTRAST (INCLUDING MRCP) TECHNIQUE: Multiplanar multisequence MR imaging  of the abdomen was performed both before and after the administration of intravenous contrast. Heavily T2-weighted images of the biliary and pancreatic ducts were obtained, and three-dimensional MRCP images were rendered by post processing. CONTRAST:  10 cc Gadavist COMPARISON:  CT of 1 day prior FINDINGS: Lower chest: Normal heart size without pericardial or pleural effusion. Tiny hiatal hernia. Left lower lobe pulmonary nodules including on series 26 are not well evaluated. Hepatobiliary: Bilateral liver lesions which are most consistent with metastasis. Example in the left hepatic lobe at 1.0 cm on 44/20. In the posterior right hepatic lobe at 11 mm on image 35/20. Small gallstones including on 24/6. Gallbladder distension but no specific evidence of acute cholecystitis. Mild intrahepatic biliary duct dilatation. The common duct measures 12 mm on 34/12. No choledocholithiasis. Pancreas: Atrophy with main duct and side branch dilatation throughout the body, neck, tail. This continues to the level of a pancreatic head mass which measures 4.4 x 4.0 cm on image 66/20. No superimposed pancreatitis. Spleen:  Normal in size, without focal abnormality. Adrenals/Urinary Tract: Normal left adrenal gland. Minimal right adrenal nodularity. This consistent with an adenoma of 8 mm on 46/9. Bilateral renal cysts. An upper pole left renal lesion measures 1.8 cm and demonstrates precontrast heterogeneous T1 signal. Equivocal post-contrast enhancement, including on subtracted image 54/25. Stomach/Bowel: Normal distal stomach and abdominal bowel loops. Vascular/Lymphatic: Aortic atherosclerosis. There is involvement of the superior mesenteric vein and splenoportal confluence by tumor, including on 65/20. No abdominal adenopathy. Other:  No ascites.  No evidence of omental or peritoneal disease. Musculoskeletal: No acute osseous abnormality. IMPRESSION: 1. Pancreatic head mass is consistent with adenocarcinoma. This causes biliary  and pancreatic duct dilatation. 2. Bilateral hepatic metastasis. 3. Cholelithiasis without evidence of acute cholecystitis. 4. Upper pole left renal lesion is indeterminate and could represent a complex cyst or a mildly enhancing renal cell carcinoma. Of questionable clinical significance given age and comorbidities. 5. Left lung base nodules are suboptimally evaluated. If complete staging is desired, consider chest CT. 6. Right adrenal adenoma. Electronically Signed   By: Abigail Miyamoto M.D.   On: 05/04/2019 18:14   Dg Ercp Biliary & Pancreatic Ducts  Result Date: 05/05/2019 CLINICAL DATA:  Obstructive jaundice EXAM: ERCP TECHNIQUE: Multiple spot images obtained with the fluoroscopic device and submitted for interpretation post-procedure. COMPARISON:  MRCP from the previous day FINDINGS: Series of fluoroscopic spot images document endoscopic cannulation and opacification of the CBD. A plastic pancreatic duct stent is noted. There is an occluded segment in the mid CBD, mildly distended proximally, decompressed distally. Limited opacification of the intrahepatic biliary tree. Subsequent images document placement of a metallic biliary stent across the lesion. IMPRESSION: Endoscopic CBD cannulation and intervention with metallic stent placement. These images were submitted for radiologic interpretation only. Please see the procedural report for the amount of contrast and the fluoroscopy time utilized. Electronically Signed  By: Lucrezia Europe M.D.   On: 05/05/2019 15:13   Mr Abdomen Mrcp Moise Boring Contast  Result Date: 05/04/2019 CLINICAL DATA:  Abnormal liver function tests. CT demonstrating possible pancreatic and liver lesions. EXAM: MRI ABDOMEN WITHOUT AND WITH CONTRAST (INCLUDING MRCP) TECHNIQUE: Multiplanar multisequence MR imaging of the abdomen was performed both before and after the administration of intravenous contrast. Heavily T2-weighted images of the biliary and pancreatic ducts were obtained, and  three-dimensional MRCP images were rendered by post processing. CONTRAST:  10 cc Gadavist COMPARISON:  CT of 1 day prior FINDINGS: Lower chest: Normal heart size without pericardial or pleural effusion. Tiny hiatal hernia. Left lower lobe pulmonary nodules including on series 26 are not well evaluated. Hepatobiliary: Bilateral liver lesions which are most consistent with metastasis. Example in the left hepatic lobe at 1.0 cm on 44/20. In the posterior right hepatic lobe at 11 mm on image 35/20. Small gallstones including on 24/6. Gallbladder distension but no specific evidence of acute cholecystitis. Mild intrahepatic biliary duct dilatation. The common duct measures 12 mm on 34/12. No choledocholithiasis. Pancreas: Atrophy with main duct and side branch dilatation throughout the body, neck, tail. This continues to the level of a pancreatic head mass which measures 4.4 x 4.0 cm on image 66/20. No superimposed pancreatitis. Spleen:  Normal in size, without focal abnormality. Adrenals/Urinary Tract: Normal left adrenal gland. Minimal right adrenal nodularity. This consistent with an adenoma of 8 mm on 46/9. Bilateral renal cysts. An upper pole left renal lesion measures 1.8 cm and demonstrates precontrast heterogeneous T1 signal. Equivocal post-contrast enhancement, including on subtracted image 54/25. Stomach/Bowel: Normal distal stomach and abdominal bowel loops. Vascular/Lymphatic: Aortic atherosclerosis. There is involvement of the superior mesenteric vein and splenoportal confluence by tumor, including on 65/20. No abdominal adenopathy. Other:  No ascites.  No evidence of omental or peritoneal disease. Musculoskeletal: No acute osseous abnormality. IMPRESSION: 1. Pancreatic head mass is consistent with adenocarcinoma. This causes biliary and pancreatic duct dilatation. 2. Bilateral hepatic metastasis. 3. Cholelithiasis without evidence of acute cholecystitis. 4. Upper pole left renal lesion is indeterminate and  could represent a complex cyst or a mildly enhancing renal cell carcinoma. Of questionable clinical significance given age and comorbidities. 5. Left lung base nodules are suboptimally evaluated. If complete staging is desired, consider chest CT. 6. Right adrenal adenoma. Electronically Signed   By: Abigail Miyamoto M.D.   On: 05/04/2019 18:14        Scheduled Meds:  amLODipine  10 mg Oral Daily   aspirin EC  81 mg Oral Daily   atorvastatin  20 mg Oral Daily   enoxaparin (LOVENOX) injection  40 mg Subcutaneous QHS   insulin aspart  0-15 Units Subcutaneous TID WC   insulin aspart  0-5 Units Subcutaneous QHS   nebivolol  20 mg Oral Q0600   Continuous Infusions:  dextrose 5 % and 0.9% NaCl 100 mL/hr at 05/05/19 1615     LOS: 2 days    Time spent: 25 minutes    Edwin Dada, MD [facility] 05/05/2019, 7:09 PM     Please page through [place]:  [email or web address] Password [email or web address] If 7PM-7AM, please contact night-coverage

## 2019-05-05 NOTE — Progress Notes (Signed)
Inpatient Diabetes Program Recommendations  AACE/ADA: New Consensus Statement on Inpatient Glycemic Control (2015)  Target Ranges:  Prepandial:   less than 140 mg/dL      Peak postprandial:   less than 180 mg/dL (1-2 hours)      Critically ill patients:  140 - 180 mg/dL   Lab Results  Component Value Date   GLUCAP 100 (H) 05/05/2019   HGBA1C 6.0 (H) 05/03/2019    Review of Glycemic Control Results for Damon Snyder, Damon Snyder (MRN [id number]) as of 05/05/2019 11:44  Ref. Range 05/03/2019 13:28 05/03/2019 22:34 05/03/2019 23:34 05/04/2019 07:03 05/04/2019 07:47 05/04/2019 12:13 05/04/2019 13:46 05/04/2019 17:33 05/04/2019 20:14 05/05/2019 07:49  Glucose-Capillary Latest Ref Range: 70 - 99 mg/dL 97 46 (L) 84 58 (L) 111 (H) 62 (L) 92 89 170 (H) 100 (H)   Diabetes history: DM 2 Outpatient Diabetes medications: Lantus 64 units Current orders for Inpatient glycemic control:  Novolog 0-15 units tid Novolog 0-5 units qhs  Inpatient Diabetes Program Recommendations:    Occassional hypoglycemia. Slightly elevated renal function.   Consider reducing correction scale to Novolog 0-9 units tid  Thanks,  Tama Headings RN, MSN, BC-ADM Inpatient Diabetes Coordinator Team Pager [phone] (8a-5p)

## 2019-05-05 NOTE — Progress Notes (Signed)
Damon Snyder. 9:34 AM  Subjective: Patient doing well without any specific complaints and we reviewed his MRI and discussed the case with his wife as well  Objective: Vital signs stable afebrile no acute distress lungs are clear heart regular rate and rhythm abdomen is soft nontender liver tests about the same CEA CA 19 pending MRI reviewed as above  Assessment: Probable pancreatic cancer with obstruction and possible liver mets  Plan: The risks benefits methods and success rate of ERCP with probable tissue sampling and stenting was discussed with the patient and his wife and will proceed later today with further work-up and plans pending those findings  [facility]  office [phone] After 5PM or if no answer call [phone]

## 2019-05-06 ENCOUNTER — Encounter (HOSPITAL_COMMUNITY): Payer: Self-pay | Admitting: Gastroenterology

## 2019-05-06 LAB — CBC WITH DIFFERENTIAL/PLATELET
Abs Immature Granulocytes: 0.02 10*3/uL (ref 0.00–0.07)
Basophils Absolute: 0 10*3/uL (ref 0.0–0.1)
Basophils Relative: 0 %
Eosinophils Absolute: 0 10*3/uL (ref 0.0–0.5)
Eosinophils Relative: 0 %
HCT: 35.3 % — ABNORMAL LOW (ref 39.0–52.0)
Hemoglobin: 11.5 g/dL — ABNORMAL LOW (ref 13.0–17.0)
Immature Granulocytes: 0 %
Lymphocytes Relative: 8 %
Lymphs Abs: 0.4 10*3/uL — ABNORMAL LOW (ref 0.7–4.0)
MCH: 28.3 pg (ref 26.0–34.0)
MCHC: 32.6 g/dL (ref 30.0–36.0)
MCV: 86.7 fL (ref 80.0–100.0)
Monocytes Absolute: 0.4 10*3/uL (ref 0.1–1.0)
Monocytes Relative: 7 %
Neutro Abs: 4.3 10*3/uL (ref 1.7–7.7)
Neutrophils Relative %: 85 %
Platelets: 176 10*3/uL (ref 150–400)
RBC: 4.07 MIL/uL — ABNORMAL LOW (ref 4.22–5.81)
RDW: 13.9 % (ref 11.5–15.5)
WBC: 5.2 10*3/uL (ref 4.0–10.5)
nRBC: 0 % (ref 0.0–0.2)

## 2019-05-06 LAB — COMPREHENSIVE METABOLIC PANEL
ALT: 161 U/L — ABNORMAL HIGH (ref 0–44)
AST: 93 U/L — ABNORMAL HIGH (ref 15–41)
Albumin: 2.8 g/dL — ABNORMAL LOW (ref 3.5–5.0)
Alkaline Phosphatase: 537 U/L — ABNORMAL HIGH (ref 38–126)
Anion gap: 13 (ref 5–15)
BUN: 16 mg/dL (ref 8–23)
CO2: 19 mmol/L — ABNORMAL LOW (ref 22–32)
Calcium: 8.9 mg/dL (ref 8.9–10.3)
Chloride: 107 mmol/L (ref 98–111)
Creatinine, Ser: 1.5 mg/dL — ABNORMAL HIGH (ref 0.61–1.24)
GFR calc Af Amer: 47 mL/min — ABNORMAL LOW (ref >60)
GFR calc non Af Amer: 41 mL/min — ABNORMAL LOW (ref >60)
Glucose, Bld: 214 mg/dL — ABNORMAL HIGH (ref 70–99)
Potassium: 3.6 mmol/L (ref 3.5–5.1)
Sodium: 139 mmol/L (ref 135–145)
Total Bilirubin: 4.1 mg/dL — ABNORMAL HIGH (ref 0.3–1.2)
Total Protein: 6.2 g/dL — ABNORMAL LOW (ref 6.5–8.1)

## 2019-05-06 LAB — GLUCOSE, CAPILLARY
Glucose-Capillary: 181 mg/dL — ABNORMAL HIGH (ref 70–99)
Glucose-Capillary: 174 mg/dL — ABNORMAL HIGH (ref 70–99)
Glucose-Capillary: 189 mg/dL — ABNORMAL HIGH (ref 70–99)
Glucose-Capillary: 186 mg/dL — ABNORMAL HIGH (ref 70–99)

## 2019-05-06 MED ORDER — HYDRALAZINE HCL 20 MG/ML IJ SOLN
INTRAMUSCULAR | Status: AC
  Filled 2019-05-06: qty 1

## 2019-05-06 MED ORDER — HYDRALAZINE HCL 20 MG/ML IJ SOLN
10.0000 mg | Freq: Once | INTRAMUSCULAR | Status: AC
  Administered 2019-05-06: 04:00:00 10 mg via INTRAVENOUS

## 2019-05-06 MED ORDER — INSULIN GLARGINE 100 UNIT/ML ~~LOC~~ SOLN
20.0000 [IU] | Freq: Every day | SUBCUTANEOUS | Status: DC
  Administered 2019-05-06 – 2019-05-07 (×2): 20 [IU] via SUBCUTANEOUS
  Filled 2019-05-06 (×2): qty 0.2

## 2019-05-06 MED ORDER — NIFEDIPINE ER OSMOTIC RELEASE 60 MG PO TB24
60.0000 mg | ORAL_TABLET | Freq: Every day | ORAL | Status: DC
  Administered 2019-05-06 – 2019-05-07 (×2): 60 mg via ORAL
  Filled 2019-05-06 (×2): qty 1

## 2019-05-06 NOTE — Progress Notes (Signed)
PROGRESS NOTE    Damon Snyder.  FIE:[id number]  DOB: [birth year]  DOA: 05/03/2019 PCP: Mayra Neer, MD  Brief Narrative:  85 year old male with history of colon cancer s/p partial colectomy, chemotherapy, prostate cancer s/p radiation treatment, hypertension, type 2 diabetes mellitus insulin-dependent, osteoarthritis presented to the emergency department after having a fall at home due to generalized weakness, persistent left hip pain radiating to the left knee especially when patient gets up from the bed, associated weight loss of 30 pounds in the last 2 months, loss of appetite, poor p.o. intake.  Work-up in the emergency department revealed elevated LFTs, concerning this patient underwent CT abdomen and pelvis showed pancreatic soft tissue density at the level of the pancreatic head 3.5X 3.8 cm, pulmonary nodules.  Also reported to have multiple hypoattenuated lesions in the liver distended gallbladder with possible sludge but no evidence of cholecystitis.  Gastroenterology is consulted, underwent ERCP, EUS, stent placement to CBD, biopsies were done.  Patient was also found to be in acute renal failure, improved with IV fluids.  Assessment & Plan:    ## Painless obstructive jaundice: - -  Given history of weight loss high suspicion for malignancy.   -Underwent MRCP-showed pancreatic head mass consistent with adenocarcinoma, causing biliary and pancreatic ductal dilatation, bilateral hepatic metastasis.  Cholelithiasis without evidence of acute cholecystitis., MRCP scheduled for this afternoon.   -Underwent ERCP/stenting, EUS with sampling -Patient will need to follow-up with GI as an outpatient..   - CA-19-9 and CEA level elevated 502, 240.   -Patient will need to follow-up with oncology as an outpatient  ##  Liver/lung nodules: -Concerning for metastatic disease especially with his history of colon cancer/prostate cancer and recent weight loss.,  Elevated CEA, CA 19 9  -PSA  however only at 0.35, unlikely to be prostate cancer.   ## Left hip/knee pain:  -Orthopedics felt patient's knee pain is referred pain from left hip arthritis. -X-ray of the left hip consistent with osteoarthritis. -Patient will need bone/PET scan once biopsy results are back to rule out metastasis to the bone   ## Hypoglycemia: -Decrease the dose of Lantus while hospitalized -Patient will continue with home dose as patient's diet might be different.  Patient denies having low blood sugars at home.  .  ##  Acute on chronic renal failure with history of chronic kidney disease stage III:  -Patient's baseline creatinine is 1.4 -Creatinine elevated to 2.5, prerenal -Discontinue hydrochlorothiazide -Improved with IV fluids   ##  Hypertension accelerated:  -Discontinued amlodipine, started on nifedipine with good improvement -Discontinued HCTZ  ## Hyperlipidemia: -On statins.  Repeat lipid profile in a.m. given recent weight loss.  ##  Peripheral vascular disease: -On statins and ?Aspirin 325 mg twice daily.  Will change to 81 mg daily  Debility -Patient had a fall at home -Evaluated by physical therapy, recommended home health  DVT prophylaxis: Lovenox Code Status: Full code Family / Patient Communication:  Patient, wife  disposition Plan:  Home with home health  Subjective:  Patient states feels better from the time of the admission Objective: Vitals:   05/05/19 2300 05/06/19 0300 05/06/19 0506 05/06/19 1502  BP: (!) 179/69 (!) 197/84 (!) 168/57 (!) 154/60  Pulse: 69 72 73 64  Resp:   17 16  Temp:   97.7 F (36.5 C) 98.1 F (36.7 C)  TempSrc:   Oral Oral  SpO2:   100% 100%  Weight:      Height:  Intake/Output Summary (Last 24 hours) at 05/06/2019 1748 Last data filed at 05/06/2019 1330 Gross per 24 hour  Intake 660 ml  Output 2200 ml  Net -1540 ml   Filed Weights   05/03/19 2230  Weight: 101.9 kg    Physical Examination:  General exam:  Appears calm and comfortable, mildly icteric Respiratory system: Clear to auscultation. Respiratory effort normal. Cardiovascular system: S1 & S2 heard, RRR. No JVD, murmurs, rubs. No pedal edema. Gastrointestinal system: Abdomen is nondistended, soft and nontender. No organomegaly or masses felt. Normal bowel sounds heard. Central nervous system: Alert and oriented. No focal neurological deficits. Extremities: Symmetric 4 x 5 power, decreased range of motion along left hip flexion, no significant knee joint swelling noted Skin: No rashes, lesions or ulcers Psychiatry: Judgement and insight appear normal. Mood & affect appropriate.     Data Reviewed: I have personally reviewed following labs and imaging studies  CBC: Recent Labs  Lab 05/03/19 1503 05/04/19 0255 05/06/19 0549  WBC 6.2 6.8 5.2  NEUTROABS 4.6  --  4.3  HGB 12.1* 11.2* 11.5*  HCT 37.7* 35.1* 35.3*  MCV 89.1 88.9 86.7  PLT 196 184 094   Basic Metabolic Panel: Recent Labs  Lab 05/03/19 1503 05/04/19 0255 05/05/19 0225 05/06/19 0549  NA 137 138 136 139  K 3.4* 3.2* 3.6 3.6  CL 106 107 108 107  CO2 19* 18* 19* 19*  GLUCOSE 106* 44* 138* 214*  BUN 33* 28* 22 16  CREATININE 2.12* 1.79* 1.64* 1.50*  CALCIUM 9.3 8.8* 8.7* 8.9   GFR: Estimated Creatinine Clearance: 41 mL/min (A) (by C-G formula based on SCr of 1.5 mg/dL (H)). Liver Function Tests: Recent Labs  Lab 05/03/19 1503 05/04/19 0255 05/05/19 0225 05/06/19 0549  AST 154* 138* 118* 93*  ALT 221* 196* 181* 161*  ALKPHOS 588* 505* 530* 537*  BILITOT 5.9* 5.6* 5.8* 4.1*  PROT 7.3 6.3* 5.8* 6.2*  ALBUMIN 3.4* 2.9* 2.9* 2.8*   Recent Labs  Lab 05/03/19 1726 05/04/19 0255  LIPASE 122* 125*   No results for input(s): AMMONIA in the last 168 hours. Coagulation Profile: No results for input(s): INR, PROTIME in the last 168 hours. Cardiac Enzymes: No results for input(s): CKTOTAL, CKMB, CKMBINDEX, TROPONINI in the last 168 hours. BNP (last 3  results) No results for input(s): PROBNP in the last 8760 hours. HbA1C: Recent Labs    05/03/19 2244  HGBA1C 6.0*   CBG: Recent Labs  Lab 05/05/19 1700 05/05/19 2128 05/06/19 0756 05/06/19 1152 05/06/19 1705  GLUCAP 186* 266* 181* 174* 189*   Lipid Profile: No results for input(s): CHOL, HDL, LDLCALC, TRIG, CHOLHDL, LDLDIRECT in the last 72 hours. Thyroid Function Tests: No results for input(s): TSH, T4TOTAL, FREET4, T3FREE, THYROIDAB in the last 72 hours. Anemia Panel: No results for input(s): VITAMINB12, FOLATE, FERRITIN, TIBC, IRON, RETICCTPCT in the last 72 hours. Sepsis Labs: No results for input(s): PROCALCITON, LATICACIDVEN in the last 168 hours.  Recent Results (from the past 240 hour(s))  SARS Coronavirus 2 [facility] order, Performed in [facility] hospital lab) Nasopharyngeal Nasopharyngeal Swab     Status: None   Collection Time: 05/03/19  6:36 PM   Specimen: Nasopharyngeal Swab  Result Value Ref Range Status   SARS Coronavirus 2 NEGATIVE NEGATIVE Final    Comment: (NOTE) If result is NEGATIVE SARS-CoV-2 target nucleic acids are NOT DETECTED. The SARS-CoV-2 RNA is generally detectable in upper and lower  respiratory specimens during the acute phase of infection. The lowest  concentration of SARS-CoV-2 viral copies this assay can detect is 250  copies / mL. A negative result does not preclude SARS-CoV-2 infection  and should not be used as the sole basis for treatment or other  patient management decisions.  A negative result may occur with  improper specimen collection / handling, submission of specimen other  than nasopharyngeal swab, presence of viral mutation(s) within the  areas targeted by this assay, and inadequate number of viral copies  (<250 copies / mL). A negative result must be combined with clinical  observations, patient history, and epidemiological information. If result is POSITIVE SARS-CoV-2 target nucleic acids are DETECTED. The SARS-CoV-2  RNA is generally detectable in upper and lower  respiratory specimens dur ing the acute phase of infection.  Positive  results are indicative of active infection with SARS-CoV-2.  Clinical  correlation with patient history and other diagnostic information is  necessary to determine patient infection status.  Positive results do  not rule out bacterial infection or co-infection with other viruses. If result is PRESUMPTIVE POSTIVE SARS-CoV-2 nucleic acids MAY BE PRESENT.   A presumptive positive result was obtained on the submitted specimen  and confirmed on repeat testing.  While 2019 novel coronavirus  (SARS-CoV-2) nucleic acids may be present in the submitted sample  additional confirmatory testing may be necessary for epidemiological  and / or clinical management purposes  to differentiate between  SARS-CoV-2 and other Sarbecovirus currently known to infect humans.  If clinically indicated additional testing with an alternate test  methodology [phone]) is advised. The SARS-CoV-2 RNA is generally  detectable in upper and lower respiratory sp ecimens during the acute  phase of infection. The expected result is Negative. Fact Sheet for Patients:  StrictlyIdeas.no Fact Sheet for Healthcare Providers: BankingDealers.co.za This test is not yet approved or cleared by the [place] FDA and has been authorized for detection and/or diagnosis of SARS-CoV-2 by FDA under an Emergency Use Authorization (EUA).  This EUA will remain in effect (meaning this test can be used) for the duration of the COVID-19 declaration under Section 564(b)(1) of the Act, 21 U.S.C. section 360bbb-3(b)(1), unless the authorization is terminated or revoked sooner. Performed at [facility], [place] [address] [place], [place] [address]       Radiology Studies: Dg Ercp Biliary & Pancreatic Ducts  Result Date: 05/05/2019 CLINICAL DATA:  Obstructive jaundice  EXAM: ERCP TECHNIQUE: Multiple spot images obtained with the fluoroscopic device and submitted for interpretation post-procedure. COMPARISON:  MRCP from the previous day FINDINGS: Series of fluoroscopic spot images document endoscopic cannulation and opacification of the CBD. A plastic pancreatic duct stent is noted. There is an occluded segment in the mid CBD, mildly distended proximally, decompressed distally. Limited opacification of the intrahepatic biliary tree. Subsequent images document placement of a metallic biliary stent across the lesion. IMPRESSION: Endoscopic CBD cannulation and intervention with metallic stent placement. These images were submitted for radiologic interpretation only. Please see the procedural report for the amount of contrast and the fluoroscopy time utilized. Electronically Signed   By: Lucrezia Europe M.D.   On: 05/05/2019 15:13     Scheduled Meds: . aspirin EC  81 mg Oral Daily  . atorvastatin  20 mg Oral Daily  . enoxaparin (LOVENOX) injection  40 mg Subcutaneous QHS  . insulin aspart  0-15 Units Subcutaneous TID WC  . insulin aspart  0-5 Units Subcutaneous QHS  . insulin glargine  20 Units Subcutaneous Daily  . nebivolol  20 mg  Oral Q0600  . NIFEdipine  60 mg Oral Daily   Continuous Infusions:   LOS: 3 days    Time spent: 35 minutes    Lonetta Blassingame, MD [facility] Pager [phone]  If 7PM-7AM, please contact night-coverage [email or web address] Password [facility] 05/06/2019, 5:48 PM

## 2019-05-06 NOTE — Progress Notes (Signed)
Patient is doing fine following yesterday's procedure, having eaten a good breakfast this morning.  No abdominal pain or nausea.  Bilirubin is improved overnight following biliary stent placement.  Brushings for cytology pending.  Recommendations:  1.  Await cytology brushings.   2. Would consult oncology--if biliary brushings are negative or equivocal (as is usually the case), oncology could help to direct further work-up (?  Needle biopsy of hepatic or pancreatic lesion?).  If the brushings are positive, they could help direct treatment.  3.  If the patient is still in the hospital next Monday, he should have a KUB to see if the pancreatic ductal stent has passed spontaneously, as it is intended to do.  If the patient is discharged prior to Monday, please make arrangements either for him to have a follow-up KUB, or to contact our office to make arrangements for that exam.  We will sign off, and possibly follow the patient at a distance out of interest.  However, please call us if further input from [place] is desired.  Cleotis Nipper, M.D. Pager [phone] If no answer or after 5 PM call [phone]

## 2019-05-06 NOTE — Evaluation (Signed)
Physical Therapy Evaluation Patient Details Name: Damon Snyder. MRN: [id number] DOB: 06-12-1928 Today's Date: 05/06/2019   History of Present Illness  Pt is an 85 y/o male admitted following fall. Found to have elevated LFTs. Pt is s/p ERCP with bilary brushing and stent placement. PMH includes HTN, DM, dCHF, prostate cancer, and L TKA.   Clinical Impression  Pt admitted secondary to problem above with deficits below. Pt requiring min to min guard A for mobility tasks using RW this session. Overall tolerated gait training well. Educated about using RW at home to increase safety. Reports wife available to assist if needed at d/c. Will continue to follow acutely to maximize functional mobility independence and safety.     Follow Up Recommendations Home health PT;Supervision for mobility/OOB    Equipment Recommendations  3in1 (PT)    Recommendations for Other Services       Precautions / Restrictions Precautions Precautions: Fall Restrictions Weight Bearing Restrictions: No      Mobility  Bed Mobility Overal bed mobility: Needs Assistance Bed Mobility: Supine to Sit;Sit to Supine     Supine to sit: Supervision Sit to supine: Supervision   General bed mobility comments: Supervision for safety. Increased time required to perform mobility tasks.   Transfers Overall transfer level: Needs assistance Equipment used: Rolling walker (2 wheeled) Transfers: Sit to/from Stand Sit to Stand: Min assist         General transfer comment: Min A for steadying assist. Cues for safe hand placement.   Ambulation/Gait Ambulation/Gait assistance: Min guard Gait Distance (Feet): 200 Feet Assistive device: Rolling walker (2 wheeled) Gait Pattern/deviations: Step-through pattern;Decreased stride length Gait velocity: Decreased   General Gait Details: Slow, guarded gait. Mild unsteadiness noted, however, no LOB noted. Educated about using RW at home to improve safety.   Stairs            Wheelchair Mobility    Modified Rankin (Stroke Patients Only)       Balance Overall balance assessment: Needs assistance Sitting-balance support: No upper extremity supported;Feet supported Sitting balance-Leahy Scale: Good     Standing balance support: Bilateral upper extremity supported;During functional activity Standing balance-Leahy Scale: Poor Standing balance comment: Reliant on BUE support                              Pertinent Vitals/Pain Pain Assessment: No/denies pain    Home Living Family/patient expects to be discharged to:: Private residence Living Arrangements: Spouse/significant other Available Help at Discharge: Family Type of Home: House Home Access: Ramped entrance     Home Layout: One level Home Equipment: Environmental consultant - 2 wheels;Cane - single point      Prior Function Level of Independence: Independent with assistive device(s)         Comments: Reports using cane for mobility tasks.      Hand Dominance        Extremity/Trunk Assessment   Upper Extremity Assessment Upper Extremity Assessment: Overall WFL for tasks assessed    Lower Extremity Assessment Lower Extremity Assessment: Generalized weakness    Cervical / Trunk Assessment Cervical / Trunk Assessment: Normal  Communication   Communication: No difficulties  Cognition Arousal/Alertness: Awake/alert Behavior During Therapy: WFL for tasks assessed/performed Overall Cognitive Status: Within Functional Limits for tasks assessed  General Comments General comments (skin integrity, edema, etc.): Pt's wife present in room during session.     Exercises     Assessment/Plan    PT Assessment Patient needs continued PT services  PT Problem List Decreased strength;Decreased balance;Decreased mobility;Decreased knowledge of use of DME       PT Treatment Interventions DME instruction;Gait training;Functional  mobility training;Therapeutic exercise;Therapeutic activities;Balance training;Patient/family education    PT Goals (Current goals can be found in the Care Plan section)  Acute Rehab PT Goals Patient Stated Goal: to go home PT Goal Formulation: With patient Time For Goal Achievement: 05/20/19 Potential to Achieve Goals: Good    Frequency Min 3X/week   Barriers to discharge        Co-evaluation               AM-PAC PT "6 Clicks" Mobility  Outcome Measure Help needed turning from your back to your side while in a flat bed without using bedrails?: None Help needed moving from lying on your back to sitting on the side of a flat bed without using bedrails?: A Little Help needed moving to and from a bed to a chair (including a wheelchair)?: A Little Help needed standing up from a chair using your arms (e.g., wheelchair or bedside chair)?: A Little Help needed to walk in hospital room?: A Little Help needed climbing 3-5 steps with a railing? : A Little 6 Click Score: 19    End of Session Equipment Utilized During Treatment: Gait belt Activity Tolerance: Patient tolerated treatment well Patient left: in bed;with call bell/phone within reach;with bed alarm set;with family/visitor present Nurse Communication: Mobility status PT Visit Diagnosis: History of falling (Z91.81);Muscle weakness (generalized) (M62.81)    Time: 1123-1140 PT Time Calculation (min) (ACUTE ONLY): 17 min   Charges:   PT Evaluation $PT Eval Low Complexity: [place], PT, DPT  Acute Rehabilitation Services  Pager: [phone] Office: [phone]   Rudean Hitt 05/06/2019, 1:10 PM

## 2019-05-06 NOTE — Progress Notes (Signed)
[facility] notified  bp 197/84 hr 72 patient is asymptomatic.

## 2019-05-06 NOTE — TOC Initial Note (Addendum)
Transition of Care (TOC) - Initial/Assessment Note    Patient Details  Name: Damon Snyder. MRN: [id number] Date of Birth: 12/19/1927  Transition of [facility]) CM/SW Contact:    Marilu Favre, RN Phone Number: 05/06/2019, 12:51 PM  Clinical Narrative:       Tommi Rumps with [facility] has accepted referral for HHPT           Spoke to [place] with PT, she is recommending home health PT. Confirmed face sheet information with patient and visitor at bedside.   Patient has a walker at home already but does not have 3 in 1 and would like one. Will order 3 in1 . Will need orders and face to face for HHPT .  Expected Discharge Plan: [place] Barriers to Discharge: Continued Medical Work up   Patient Goals and CMS Choice Patient states their goals for this hospitalization and ongoing recovery are:: to go home CMS Medicare.gov Compare Post Acute Care list provided to:: Patient Choice offered to / list presented to : Patient, Spouse  Expected Discharge Plan and Services Expected Discharge Plan: [place]   Discharge Planning Services: CM Consult Post Acute Care Choice: [place] arrangements for the past 2 months: Single Family Home                 DME Arranged: 3-N-1 DME Agency: [employer]       HH Arranged: PT [place]: [place]        Prior Living Arrangements/Services Living arrangements for the past 2 months: Single Family Home Lives with:: Spouse Patient language and need for interpreter reviewed:: Yes Do you feel safe going back to the place where you live?: Yes      Need for Family Participation in Patient Care: Yes (Comment) Care giver support system in place?: Yes (comment) Current home services: DME Criminal Activity/Legal Involvement Pertinent to Current Situation/Hospitalization: No - Comment as needed  Activities of Daily Living      Permission Sought/Granted   Permission granted to share  information with : Yes, Verbal Permission Granted     Permission granted to share info w AGENCY: [place]        Emotional Assessment Appearance:: Appears younger than stated age Attitude/Demeanor/Rapport: Engaged Affect (typically observed): Accepting Orientation: : Oriented to Self, Oriented to Place, Oriented to  Time, Oriented to Situation Alcohol / Substance Use: Not Applicable Psych Involvement: No (comment)  Admission diagnosis:  Transaminitis [R74.0] AKI (acute kidney injury) ([place]) [N17.9] Patient Active Problem List   Diagnosis Date Noted  . Pancreatic mass 05/03/2019  . Elevated LFTs 05/03/2019  . Failure to thrive in adult 05/03/2019  . Total knee replacement status 09/14/2016  . Primary osteoarthritis of left knee   . Bilateral leg weakness 05/21/2015  . Atherosclerosis of native arteries of extremity with intermittent claudication ([place]) 11/13/2014  . Pain in lower limb 01/09/2014  . Chronic diastolic heart failure ([place]) 09/23/2013  . Near syncope 09/22/2013  . Essential hypertension, benign 09/22/2013  . Type 2 diabetes mellitus without complication, with long-term current use of insulin ([place]) 09/22/2013  . Pain in joint, ankle and foot 04/01/2013  . PVD (peripheral vascular disease) ([place]) 12/31/2012  . Onychomycosis due to dermatophyte 12/31/2012   PCP:  Mayra Neer, MD Pharmacy:   [id number] - [place], [place] [address] [place] [address] Phone: [phone] Fax: [phone]  Social Determinants of Health (SDOH) Interventions    Readmission Risk Interventions No flowsheet data found.

## 2019-05-07 ENCOUNTER — Encounter (HOSPITAL_COMMUNITY): Payer: Self-pay | Admitting: Nurse Practitioner

## 2019-05-07 DIAGNOSIS — R29898 Other symptoms and signs involving the musculoskeletal system: Secondary | ICD-10-CM

## 2019-05-07 LAB — GLUCOSE, CAPILLARY
Glucose-Capillary: 103 mg/dL — ABNORMAL HIGH (ref 70–99)
Glucose-Capillary: 199 mg/dL — ABNORMAL HIGH (ref 70–99)

## 2019-05-07 MED ORDER — TAMSULOSIN HCL 0.4 MG PO CAPS: 0.4000 mg | ORAL_CAPSULE | Freq: Every day | ORAL | 30 refills | Status: AC

## 2019-05-07 MED ORDER — NIFEDIPINE ER 60 MG PO TB24: 60.0000 mg | ORAL_TABLET | Freq: Every day | ORAL | 0 refills | Status: DC

## 2019-05-07 NOTE — TOC Progression Note (Signed)
Transition of Care (TOC) - Progression Note    Patient Details  Name: Damon Snyder. MRN: [id number] Date of Birth: 04-29-1928  Transition of Care [facility]) CM/SW Contact  Manika Hast, Edson Snowball, RN Phone Number: 05/07/2019, 10:55 AM  Clinical Narrative:     Karel Jarvis with [place] for 3 in1 , Cory with [facility] aware discharge today .  Expected Discharge Plan: [place] Barriers to Discharge: Continued Medical Work up  Expected Discharge Plan and Services Expected Discharge Plan: [place]   Discharge Planning Services: CM Consult Post Acute Care Choice: [place] arrangements for the past 2 months: Single Family Home Expected Discharge Date: 05/07/19               DME Arranged: 3-N-1 DME Agency: [employer]       HH Arranged: PT [place]: [place]         Social Determinants of Health (SDOH) Interventions    Readmission Risk Interventions No flowsheet data found.

## 2019-05-07 NOTE — Progress Notes (Signed)
Pt looks well.  Plan for dischg noted.  Biliary brushing cytology "SUSPICIOUS FOR MALIGNANCY." (Pt informed).  I will have my office contact pt to make arrangements for f/u KUB several days from now to confirm passage of pancreatic stent.  I assume that attending physician will be arranging Oncology consultation for patient.  Please let us know if we can be of further assistance.  Cleotis Nipper, M.D. Pager [phone] If no answer or after 5 PM call [phone]

## 2019-05-07 NOTE — Progress Notes (Signed)
Discharge instructions given to patient and his wife. All questions answered. Patient discharged with all belongings and 3 in 1 commode.

## 2019-05-07 NOTE — Discharge Summary (Signed)
Physician Discharge Summary  Damon Snyder. ZOX:[id number] DOB: 1928-07-07 DOA: 05/03/2019  PCP: Mayra Neer, MD  Admit date: 05/03/2019 Discharge date: 05/07/2019  Time spent: 40 minutes  Recommendations for Outpatient Follow-up:  1. Follow-up with the gastroenterology Dr. Cristina Gong 2.  Patient will be discharged home with home health   Discharge Diagnoses:  Principal Problem:   Elevated LFTs Active Problems:   Essential hypertension, benign   Type 2 diabetes mellitus without complication, with long-term current use of insulin (HCC)   Chronic diastolic heart failure (HCC)   Bilateral leg weakness   Pancreatic mass   Failure to thrive in adult   Discharge Condition: Stable  Diet recommendation: Diabetic  Filed Weights   05/03/19 2230  Weight: 101.9 kg    History of present illness and Hospital Course:  Brief Narrative:  85 year old male with history of colon cancer s/p partial colectomy, chemotherapy, prostate cancer s/p radiation treatment, hypertension, type 2 diabetes mellitus insulin-dependent, osteoarthritis presented to the emergency department after having a fall at home due to generalized weakness, persistent left hip pain radiating to the left knee especially when patient gets up from the bed, associated weight loss of 30 pounds in the last 2 months, loss of appetite, poor p.o. intake.  Work-up in the emergency department revealed elevated LFTs, concerning this patient underwent CT abdomen and pelvis showed pancreatic soft tissue density at the level of the pancreatic head 3.5X 3.8 cm, pulmonary nodules.  Also reported to have multiple hypoattenuated lesions in the liver distended gallbladder with possible sludge but no evidence of cholecystitis.  Gastroenterology is consulted, underwent ERCP, EUS, stent placement to CBD, biopsies were done.  Patient was also found to be in acute renal failure, improved with IV fluids.  Assessment & Plan:   ##Painless obstructive  jaundice:- - Given history of weight loss high suspicion for malignancy.  -Underwent MRCP-showed pancreatic head mass consistent with adenocarcinoma, causing biliary and pancreatic ductal dilatation, bilateral hepatic metastasis.  Cholelithiasis without evidence of acute cholecystitis., MRCP scheduled for this afternoon.  -Underwent ERCP/stenting, EUS with sampling -Patient will need to follow-up with GI as an outpatient..  - CA-19-9 and CEA level elevated 502, 240.  -Patient will need to follow-up with oncology as an outpatient  ## Liver/lung nodules: -Concerning for metastatic disease especially with his history of colon cancer/prostate cancer and recent weight loss.,  Elevated CEA, CA 19 9 -PSA however only at 0.35, unlikely to be prostate cancer.   ##Left hip/knee pain: -Orthopedics felt patient's knee pain is referred pain from left hip arthritis. -X-ray of the left hip consistent with osteoarthritis. -Patient will need bone/PET scan once biopsy results are back to rule out metastasis to the bone  ##Hypoglycemia: -Decrease the dose of Lantus while hospitalized -Patient will continue with home dose as patient's diet might be different.  Patient denies having low blood sugars at home.  .  ## Acute on chronic renal failure with history of chronic kidney disease stage III: -Patient's baseline creatinine is 1.4 -Creatinine elevated to 2.5, prerenal -Discontinue hydrochlorothiazide -Improved with IV fluids   ## Hypertension accelerated: -Discontinued amlodipine, started on nifedipine with good improvement -Discontinued HCTZ  ##Hyperlipidemia: -On statins. Repeat lipid profile in a.m. given recent weight loss.  ## Peripheral vascular disease: -On statins and ?Aspirin 325 mg twice daily. Will change to 81 mg daily  Debility -Patient had a fall at home -Evaluated by physical therapy, recommended home health  Procedures:  ERCP,  EUS  Consultations: Gastroenterology Orthopedic surgery Discharge  Exam: Vitals:   05/06/19 2045 05/07/19 0500  BP: (!) 136/58 (!) 140/92  Pulse: 60 65  Resp: 14 16  Temp: (!) 97.4 F (36.3 C) 98.8 F (37.1 C)  SpO2: 100% 100%    General exam: Appears calm and comfortable, mildly icteric Respiratory system: Clear to auscultation. Respiratory effort normal. Cardiovascular system: S1 & S2 heard, RRR. No JVD, murmurs, rubs. No pedal edema. Gastrointestinal system: Abdomen is nondistended, soft and nontender. No organomegaly or masses felt. Normal bowel sounds heard. Central nervous system: Alert and oriented. No focal neurological deficits. Extremities: Symmetric 4 x 5 power, decreased range of motion along left hip flexion, no significant knee joint swelling noted Skin: No rashes, lesions or ulcers Psychiatry: Judgement and insight appear normal. Mood & affect appropriate.  Discharge Instructions   Discharge Instructions    Diet - low sodium heart healthy   Complete by: As directed    Increase activity slowly   Complete by: As directed      Allergies as of 05/07/2019   No Known Allergies     Medication List    STOP taking these medications   amLODipine 10 MG tablet Commonly known as: NORVASC   colchicine 0.6 MG tablet   hydrochlorothiazide 25 MG tablet Commonly known as: HYDRODIURIL     TAKE these medications   allopurinol 100 MG tablet Commonly known as: ZYLOPRIM Take 200 mg by mouth daily as needed (gout).   aspirin EC 325 MG tablet Take 1 tablet (325 mg total) by mouth 2 (two) times daily.   atorvastatin 20 MG tablet Commonly known as: LIPITOR Take 20 mg by mouth daily.   Bystolic 20 MG Tabs Generic drug: Nebivolol HCl Take 20 mg by mouth daily.   diclofenac sodium 1 % Gel Commonly known as: VOLTAREN Apply 4 g topically 4 (four) times daily as needed. What changed:   how much to take  reasons to take this   diphenhydrAMINE 25 mg  capsule Commonly known as: BENADRYL Take 25 mg by mouth every 6 (six) hours as needed for itching or allergies.   Lantus SoloStar 100 UNIT/ML Solostar Pen Generic drug: Insulin Glargine Inject 64 Units into the skin at bedtime.   NIFEdipine 60 MG 24 hr tablet Commonly known as: ADALAT CC Take 1 tablet (60 mg total) by mouth daily. Start taking on: May 08, 2019   NovoFine 32G X 6 MM Misc Generic drug: Insulin Pen Needle   OneTouch Verio test strip Generic drug: glucose blood 1 STRIP TWICE A DAY FOR BLOOD SUGAR DX E11.22 FINGERSTICK   sodium chloride 0.65 % Soln nasal spray Commonly known as: OCEAN Place 1 spray into both nostrils as needed for congestion.   tamsulosin 0.4 MG Caps capsule Commonly known as: Flomax Take 1 capsule (0.4 mg total) by mouth daily after supper.            Durable Medical Equipment  (From admission, onward)         Start     Ordered   05/06/19 1254  For home use only DME 3 n 1  Once     05/06/19 1254         No Known Allergies Follow-up Information    Juanita Craver, MD. Schedule an appointment as soon as possible for a visit in 1 week(s).   Specialty: Gastroenterology Contact information: [address], [place] [address] [phone]        Mayra Neer, MD. Schedule an appointment as soon  as possible for a visit in 1 week(s).   Specialty: Family Medicine Contact information: [address] [place] [address] [phone]            The results of significant diagnostics from this hospitalization (including imaging, microbiology, ancillary and laboratory) are listed below for reference.    Significant Diagnostic Studies: Ct Abdomen Pelvis Wo Contrast  Result Date: 05/03/2019 CLINICAL DATA:  Transaminitis EXAM: CT ABDOMEN AND PELVIS WITHOUT CONTRAST TECHNIQUE: Multidetector CT imaging of the abdomen and pelvis was performed following the standard protocol without IV contrast.  COMPARISON:  None. FINDINGS: Lower chest: There is a partially visualized 6 mm pulmonary nodule in the left lower lobe (axial series 3, image 1). There is an additional opacity in the left lower lobe measuring approximately 7 mm (axial series 3, image 9). There is mucous plugging and bronchiectasis involving the medial right lung base.The heart size is normal. Hepatobiliary: There are multiple indeterminate hypoattenuating masses in the liver most notably within the right hepatic lobe. These measure up to approximately 1.6 cm. Multiple gallstones versus gallbladder sludge is noted. The common bile duct is mildly dilated measuring up to approximately 9 mm proximally. There appears to be some intrahepatic biliary ductal dilatation. Pancreas: There is an ill-defined soft tissue density at the pancreatic head. This is not well characterized in the absence of IV contrast. This measures approximately 3.5 x 3.8 cm (axial series 3, image 30). Spleen: No splenic laceration or hematoma. Adrenals/Urinary Tract: --Adrenal glands: No adrenal hemorrhage. --Right kidney/ureter: Multiple right-sided renal cysts are noted. There is no hydronephrosis. --Left kidney/ureter: No hydronephrosis or perinephric hematoma. --Urinary bladder: Unremarkable. Stomach/Bowel: --Stomach/Duodenum: There is a small hiatal hernia. --Small bowel: No dilatation or inflammation. --Colon: The patient is status post partial colectomy. A surgical anastomosis is noted at the level of the hepatic flexure. There is no evidence for an obstruction. --Appendix: The appendix is likely surgically absent. Vascular/Lymphatic: Atherosclerotic calcification is present within the non-aneurysmal abdominal aorta, without hemodynamically significant stenosis. --No retroperitoneal lymphadenopathy. --No mesenteric lymphadenopathy. --No pelvic or inguinal lymphadenopathy. Reproductive: Unremarkable Other: No ascites or free air. There is a right fat containing inguinal  hernia. Musculoskeletal. There is a somewhat irregular appearance of the inferior endplate of the L3 vertebral body. This is favored to represent an atypical Schmorl's node. Degenerative changes are noted throughout the visualized lumbar spine. There is grade 1 anterolisthesis of L4 on L5. There is no acute osseous abnormality. IMPRESSION: 1. Distended gallbladder with evidence of gallbladder sludge versus cholelithiasis. There is probable intrahepatic and extrahepatic biliary ductal dilatation which is not well evaluated in the absence of IV contrast. If there is clinical concern for an obstructing process such as choledocholithiasis, follow-up with MRCP/ERCP is recommended. There is no CT evidence for acute cholecystitis. 2. Multiple small hypoattenuating masses throughout the liver as detailed above. These are incompletely characterized on this exam. Follow-up with a contrast-enhanced MRI is recommended. 3. Somewhat ill-defined soft tissue density at the level of the pancreatic head. This is not well evaluated in the absence of IV contrast. Follow-up with a contrast-enhanced MRI is recommended. 4. Status post partial colectomy.  No evidence of an obstruction. 5. Multiple pulmonary nodules in the left lower lobe measuring up to approximately 7 mm. Non-contrast chest CT at 3-6 months is recommended. If the nodules are stable at time of repeat CT, then future CT at 18-24 months (from today's scan) is considered optional for low-risk patients, but is recommended for high-risk patients.  This recommendation follows the consensus statement: Guidelines for Management of Incidental Pulmonary Nodules Detected on CT Images: From the Fleischner Society 2017; Radiology 2017; 284:228-243. Electronically Signed   By: Constance Holster M.D.   On: 05/03/2019 18:27   Dg Chest 2 View  Result Date: 05/03/2019 CLINICAL DATA:  Fall. EXAM: CHEST - 2 VIEW COMPARISON:  August 13, 2015 FINDINGS: The heart size and mediastinal  contours are within normal limits. Both lungs are clear. The visualized skeletal structures are unremarkable. IMPRESSION: No active cardiopulmonary disease. Electronically Signed   By: Dorise Bullion III M.D   On: 05/03/2019 14:13   Ct Head Wo Contrast  Result Date: 05/03/2019 CLINICAL DATA:  Weakness with recent fall.  No known head injury. EXAM: CT HEAD WITHOUT CONTRAST TECHNIQUE: Contiguous axial images were obtained from the base of the skull through the vertex without intravenous contrast. COMPARISON:  MRI brain 09/22/2013. FINDINGS: Brain: There is no evidence of acute intracranial hemorrhage, mass lesion, brain edema or extra-axial fluid collection. There is stable atrophy with mild prominence of the ventricles and subarachnoid spaces. There are mild chronic small vessel ischemic changes within the periventricular white matter which are similar to previous MRI. There is no CT evidence of acute cortical infarction. Vascular: Prominent intracranial vascular calcifications. No hyperdense vessel identified. Skull: Negative for fracture or focal lesion. Sinuses/Orbits: The visualized paranasal sinuses and mastoid air cells are clear. No orbital abnormalities are seen. Other: None. IMPRESSION: Stable atrophy and mild chronic small vessel ischemic changes. No acute intracranial findings. Electronically Signed   By: Richardean Sale M.D.   On: 05/03/2019 15:58   Mr 3d Recon At Scanner  Result Date: 05/04/2019 CLINICAL DATA:  Abnormal liver function tests. CT demonstrating possible pancreatic and liver lesions. EXAM: MRI ABDOMEN WITHOUT AND WITH CONTRAST (INCLUDING MRCP) TECHNIQUE: Multiplanar multisequence MR imaging of the abdomen was performed both before and after the administration of intravenous contrast. Heavily T2-weighted images of the biliary and pancreatic ducts were obtained, and three-dimensional MRCP images were rendered by post processing. CONTRAST:  10 cc Gadavist COMPARISON:  CT of 1 day prior  FINDINGS: Lower chest: Normal heart size without pericardial or pleural effusion. Tiny hiatal hernia. Left lower lobe pulmonary nodules including on series 26 are not well evaluated. Hepatobiliary: Bilateral liver lesions which are most consistent with metastasis. Example in the left hepatic lobe at 1.0 cm on 44/20. In the posterior right hepatic lobe at 11 mm on image 35/20. Small gallstones including on 24/6. Gallbladder distension but no specific evidence of acute cholecystitis. Mild intrahepatic biliary duct dilatation. The common duct measures 12 mm on 34/12. No choledocholithiasis. Pancreas: Atrophy with main duct and side branch dilatation throughout the body, neck, tail. This continues to the level of a pancreatic head mass which measures 4.4 x 4.0 cm on image 66/20. No superimposed pancreatitis. Spleen:  Normal in size, without focal abnormality. Adrenals/Urinary Tract: Normal left adrenal gland. Minimal right adrenal nodularity. This consistent with an adenoma of 8 mm on 46/9. Bilateral renal cysts. An upper pole left renal lesion measures 1.8 cm and demonstrates precontrast heterogeneous T1 signal. Equivocal post-contrast enhancement, including on subtracted image 54/25. Stomach/Bowel: Normal distal stomach and abdominal bowel loops. Vascular/Lymphatic: Aortic atherosclerosis. There is involvement of the superior mesenteric vein and splenoportal confluence by tumor, including on 65/20. No abdominal adenopathy. Other:  No ascites.  No evidence of omental or peritoneal disease. Musculoskeletal: No acute osseous abnormality. IMPRESSION: 1. Pancreatic head mass is consistent with adenocarcinoma. This causes biliary  and pancreatic duct dilatation. 2. Bilateral hepatic metastasis. 3. Cholelithiasis without evidence of acute cholecystitis. 4. Upper pole left renal lesion is indeterminate and could represent a complex cyst or a mildly enhancing renal cell carcinoma. Of questionable clinical significance given age  and comorbidities. 5. Left lung base nodules are suboptimally evaluated. If complete staging is desired, consider chest CT. 6. Right adrenal adenoma. Electronically Signed   By: Abigail Miyamoto M.D.   On: 05/04/2019 18:14   Dg Knee Complete 4 Views Left  Result Date: 05/03/2019 CLINICAL DATA:  Pain after fall EXAM: LEFT KNEE - COMPLETE 4+ VIEW COMPARISON:  None. FINDINGS: Patient is status post left knee replacement. Hardware is in good position. No fracture or dislocation identified. No effusions. Vascular calcifications noted. IMPRESSION: No acute abnormalities. Electronically Signed   By: Dorise Bullion III M.D   On: 05/03/2019 14:14   Dg Ercp Biliary & Pancreatic Ducts  Result Date: 05/05/2019 CLINICAL DATA:  Obstructive jaundice EXAM: ERCP TECHNIQUE: Multiple spot images obtained with the fluoroscopic device and submitted for interpretation post-procedure. COMPARISON:  MRCP from the previous day FINDINGS: Series of fluoroscopic spot images document endoscopic cannulation and opacification of the CBD. A plastic pancreatic duct stent is noted. There is an occluded segment in the mid CBD, mildly distended proximally, decompressed distally. Limited opacification of the intrahepatic biliary tree. Subsequent images document placement of a metallic biliary stent across the lesion. IMPRESSION: Endoscopic CBD cannulation and intervention with metallic stent placement. These images were submitted for radiologic interpretation only. Please see the procedural report for the amount of contrast and the fluoroscopy time utilized. Electronically Signed   By: Lucrezia Europe M.D.   On: 05/05/2019 15:13   Mr Abdomen Mrcp Moise Boring Contast  Result Date: 05/04/2019 CLINICAL DATA:  Abnormal liver function tests. CT demonstrating possible pancreatic and liver lesions. EXAM: MRI ABDOMEN WITHOUT AND WITH CONTRAST (INCLUDING MRCP) TECHNIQUE: Multiplanar multisequence MR imaging of the abdomen was performed both before and after the  administration of intravenous contrast. Heavily T2-weighted images of the biliary and pancreatic ducts were obtained, and three-dimensional MRCP images were rendered by post processing. CONTRAST:  10 cc Gadavist COMPARISON:  CT of 1 day prior FINDINGS: Lower chest: Normal heart size without pericardial or pleural effusion. Tiny hiatal hernia. Left lower lobe pulmonary nodules including on series 26 are not well evaluated. Hepatobiliary: Bilateral liver lesions which are most consistent with metastasis. Example in the left hepatic lobe at 1.0 cm on 44/20. In the posterior right hepatic lobe at 11 mm on image 35/20. Small gallstones including on 24/6. Gallbladder distension but no specific evidence of acute cholecystitis. Mild intrahepatic biliary duct dilatation. The common duct measures 12 mm on 34/12. No choledocholithiasis. Pancreas: Atrophy with main duct and side branch dilatation throughout the body, neck, tail. This continues to the level of a pancreatic head mass which measures 4.4 x 4.0 cm on image 66/20. No superimposed pancreatitis. Spleen:  Normal in size, without focal abnormality. Adrenals/Urinary Tract: Normal left adrenal gland. Minimal right adrenal nodularity. This consistent with an adenoma of 8 mm on 46/9. Bilateral renal cysts. An upper pole left renal lesion measures 1.8 cm and demonstrates precontrast heterogeneous T1 signal. Equivocal post-contrast enhancement, including on subtracted image 54/25. Stomach/Bowel: Normal distal stomach and abdominal bowel loops. Vascular/Lymphatic: Aortic atherosclerosis. There is involvement of the superior mesenteric vein and splenoportal confluence by tumor, including on 65/20. No abdominal adenopathy. Other:  No ascites.  No evidence of omental or peritoneal disease. Musculoskeletal: No  acute osseous abnormality. IMPRESSION: 1. Pancreatic head mass is consistent with adenocarcinoma. This causes biliary and pancreatic duct dilatation. 2. Bilateral hepatic  metastasis. 3. Cholelithiasis without evidence of acute cholecystitis. 4. Upper pole left renal lesion is indeterminate and could represent a complex cyst or a mildly enhancing renal cell carcinoma. Of questionable clinical significance given age and comorbidities. 5. Left lung base nodules are suboptimally evaluated. If complete staging is desired, consider chest CT. 6. Right adrenal adenoma. Electronically Signed   By: Abigail Miyamoto M.D.   On: 05/04/2019 18:14   Dg Hip Unilat W Or Wo Pelvis 2-3 Views Left  Result Date: 05/03/2019 CLINICAL DATA:  Left hip pain post fall. EXAM: DG HIP (WITH OR WITHOUT PELVIS) 2-3V LEFT COMPARISON:  None. FINDINGS: There is no evidence of hip fracture or dislocation. Mild osteoarthritic changes of bilateral hips. Vascular calcifications noted. IMPRESSION: No acute fracture or dislocation identified about the left hip. Electronically Signed   By: Fidela Salisbury M.D.   On: 05/03/2019 14:14   Xr Hip Unilat W Or W/o Pelvis 2-3 Views Left  Result Date: 04/17/2019 X-rays demonstrate mild degenerative changes with paratubular spurring  Xr Knee 1-2 Views Left  Result Date: 04/17/2019 X-rays reveal a well-seated prosthesis without complication   Microbiology: Recent Results (from the past 240 hour(s))  SARS Coronavirus 2 [facility] order, Performed in [facility] hospital lab) Nasopharyngeal Nasopharyngeal Swab     Status: None   Collection Time: 05/03/19  6:36 PM   Specimen: Nasopharyngeal Swab  Result Value Ref Range Status   SARS Coronavirus 2 NEGATIVE NEGATIVE Final    Comment: (NOTE) If result is NEGATIVE SARS-CoV-2 target nucleic acids are NOT DETECTED. The SARS-CoV-2 RNA is generally detectable in upper and lower  respiratory specimens during the acute phase of infection. The lowest  concentration of SARS-CoV-2 viral copies this assay can detect is 250  copies / mL. A negative result does not preclude SARS-CoV-2 infection  and should not be used as the  sole basis for treatment or other  patient management decisions.  A negative result may occur with  improper specimen collection / handling, submission of specimen other  than nasopharyngeal swab, presence of viral mutation(s) within the  areas targeted by this assay, and inadequate number of viral copies  (<250 copies / mL). A negative result must be combined with clinical  observations, patient history, and epidemiological information. If result is POSITIVE SARS-CoV-2 target nucleic acids are DETECTED. The SARS-CoV-2 RNA is generally detectable in upper and lower  respiratory specimens dur ing the acute phase of infection.  Positive  results are indicative of active infection with SARS-CoV-2.  Clinical  correlation with patient history and other diagnostic information is  necessary to determine patient infection status.  Positive results do  not rule out bacterial infection or co-infection with other viruses. If result is PRESUMPTIVE POSTIVE SARS-CoV-2 nucleic acids MAY BE PRESENT.   A presumptive positive result was obtained on the submitted specimen  and confirmed on repeat testing.  While 2019 novel coronavirus  (SARS-CoV-2) nucleic acids may be present in the submitted sample  additional confirmatory testing may be necessary for epidemiological  and / or clinical management purposes  to differentiate between  SARS-CoV-2 and other Sarbecovirus currently known to infect humans.  If clinically indicated additional testing with an alternate test  methodology [phone]) is advised. The SARS-CoV-2 RNA is generally  detectable in upper and lower respiratory sp ecimens during the acute  phase of infection. The expected  result is Negative. Fact Sheet for Patients:  StrictlyIdeas.no Fact Sheet for Healthcare Providers: BankingDealers.co.za This test is not yet approved or cleared by the [place] FDA and has been authorized for detection  and/or diagnosis of SARS-CoV-2 by FDA under an Emergency Use Authorization (EUA).  This EUA will remain in effect (meaning this test can be used) for the duration of the COVID-19 declaration under Section 564(b)(1) of the Act, 21 U.S.C. section 360bbb-3(b)(1), unless the authorization is terminated or revoked sooner. Performed at [facility], [place] [address] [place], [place] [address]      Labs: Basic Metabolic Panel: Recent Labs  Lab 05/03/19 1503 05/04/19 0255 05/05/19 0225 05/06/19 0549  NA 137 138 136 139  K 3.4* 3.2* 3.6 3.6  CL 106 107 108 107  CO2 19* 18* 19* 19*  GLUCOSE 106* 44* 138* 214*  BUN 33* 28* 22 16  CREATININE 2.12* 1.79* 1.64* 1.50*  CALCIUM 9.3 8.8* 8.7* 8.9   Liver Function Tests: Recent Labs  Lab 05/03/19 1503 05/04/19 0255 05/05/19 0225 05/06/19 0549  AST 154* 138* 118* 93*  ALT 221* 196* 181* 161*  ALKPHOS 588* 505* 530* 537*  BILITOT 5.9* 5.6* 5.8* 4.1*  PROT 7.3 6.3* 5.8* 6.2*  ALBUMIN 3.4* 2.9* 2.9* 2.8*   Recent Labs  Lab 05/03/19 1726 05/04/19 0255  LIPASE 122* 125*   No results for input(s): AMMONIA in the last 168 hours. CBC: Recent Labs  Lab 05/03/19 1503 05/04/19 0255 05/06/19 0549  WBC 6.2 6.8 5.2  NEUTROABS 4.6  --  4.3  HGB 12.1* 11.2* 11.5*  HCT 37.7* 35.1* 35.3*  MCV 89.1 88.9 86.7  PLT 196 184 176   Cardiac Enzymes: No results for input(s): CKTOTAL, CKMB, CKMBINDEX, TROPONINI in the last 168 hours. BNP: BNP (last 3 results) No results for input(s): BNP in the last 8760 hours.  ProBNP (last 3 results) No results for input(s): PROBNP in the last 8760 hours.  CBG: Recent Labs  Lab 05/06/19 1152 05/06/19 1705 05/06/19 2047 05/07/19 0802 05/07/19 1223  GLUCAP 174* 189* 186* 103* 199*       Signed:  Kyasia Steuck MD.  [facility] 05/07/2019, 3:17 PM

## 2019-05-08 DIAGNOSIS — K922 Gastrointestinal hemorrhage, unspecified: Secondary | ICD-10-CM | POA: Diagnosis not present

## 2019-05-08 DIAGNOSIS — Z8546 Personal history of malignant neoplasm of prostate: Secondary | ICD-10-CM | POA: Diagnosis not present

## 2019-05-08 DIAGNOSIS — I110 Hypertensive heart disease with heart failure: Secondary | ICD-10-CM | POA: Diagnosis not present

## 2019-05-08 DIAGNOSIS — R911 Solitary pulmonary nodule: Secondary | ICD-10-CM | POA: Diagnosis not present

## 2019-05-08 DIAGNOSIS — Z9841 Cataract extraction status, right eye: Secondary | ICD-10-CM | POA: Diagnosis not present

## 2019-05-08 DIAGNOSIS — Z9842 Cataract extraction status, left eye: Secondary | ICD-10-CM | POA: Diagnosis not present

## 2019-05-08 DIAGNOSIS — E7800 Pure hypercholesterolemia, unspecified: Secondary | ICD-10-CM | POA: Diagnosis not present

## 2019-05-08 DIAGNOSIS — R627 Adult failure to thrive: Secondary | ICD-10-CM | POA: Diagnosis not present

## 2019-05-08 DIAGNOSIS — K449 Diaphragmatic hernia without obstruction or gangrene: Secondary | ICD-10-CM | POA: Diagnosis not present

## 2019-05-08 DIAGNOSIS — M109 Gout, unspecified: Secondary | ICD-10-CM | POA: Diagnosis not present

## 2019-05-08 DIAGNOSIS — N3289 Other specified disorders of bladder: Secondary | ICD-10-CM | POA: Diagnosis not present

## 2019-05-08 DIAGNOSIS — M47811 Spondylosis without myelopathy or radiculopathy, occipito-atlanto-axial region: Secondary | ICD-10-CM | POA: Diagnosis not present

## 2019-05-08 DIAGNOSIS — E119 Type 2 diabetes mellitus without complications: Secondary | ICD-10-CM | POA: Diagnosis not present

## 2019-05-08 DIAGNOSIS — Z961 Presence of intraocular lens: Secondary | ICD-10-CM | POA: Diagnosis not present

## 2019-05-08 DIAGNOSIS — M160 Bilateral primary osteoarthritis of hip: Secondary | ICD-10-CM | POA: Diagnosis not present

## 2019-05-08 DIAGNOSIS — M4316 Spondylolisthesis, lumbar region: Secondary | ICD-10-CM | POA: Diagnosis not present

## 2019-05-08 DIAGNOSIS — Z85038 Personal history of other malignant neoplasm of large intestine: Secondary | ICD-10-CM | POA: Diagnosis not present

## 2019-05-08 DIAGNOSIS — M19011 Primary osteoarthritis, right shoulder: Secondary | ICD-10-CM | POA: Diagnosis not present

## 2019-05-08 DIAGNOSIS — M19012 Primary osteoarthritis, left shoulder: Secondary | ICD-10-CM | POA: Diagnosis not present

## 2019-05-08 DIAGNOSIS — N281 Cyst of kidney, acquired: Secondary | ICD-10-CM | POA: Diagnosis not present

## 2019-05-08 DIAGNOSIS — Z9049 Acquired absence of other specified parts of digestive tract: Secondary | ICD-10-CM | POA: Diagnosis not present

## 2019-05-08 DIAGNOSIS — K8689 Other specified diseases of pancreas: Secondary | ICD-10-CM | POA: Diagnosis not present

## 2019-05-08 DIAGNOSIS — I5032 Chronic diastolic (congestive) heart failure: Secondary | ICD-10-CM | POA: Diagnosis not present

## 2019-05-08 DIAGNOSIS — D649 Anemia, unspecified: Secondary | ICD-10-CM | POA: Diagnosis not present

## 2019-05-08 DIAGNOSIS — I708 Atherosclerosis of other arteries: Secondary | ICD-10-CM | POA: Diagnosis not present

## 2019-05-12 ENCOUNTER — Ambulatory Visit
Admission: RE | Admit: 2019-05-12 | Discharge: 2019-05-12 | Disposition: A | Discharge status: Home / Self Care | Payer: Medicare Other | Source: Ambulatory Visit | Attending: Gastroenterology | Admitting: Gastroenterology

## 2019-05-12 ENCOUNTER — Other Ambulatory Visit: Payer: Self-pay | Admitting: Gastroenterology

## 2019-05-12 DIAGNOSIS — K8689 Other specified diseases of pancreas: Secondary | ICD-10-CM | POA: Diagnosis not present

## 2019-05-12 DIAGNOSIS — M4316 Spondylolisthesis, lumbar region: Secondary | ICD-10-CM | POA: Diagnosis not present

## 2019-05-12 DIAGNOSIS — M160 Bilateral primary osteoarthritis of hip: Secondary | ICD-10-CM | POA: Diagnosis not present

## 2019-05-12 DIAGNOSIS — M47816 Spondylosis without myelopathy or radiculopathy, lumbar region: Secondary | ICD-10-CM | POA: Diagnosis not present

## 2019-05-12 DIAGNOSIS — Z9689 Presence of other specified functional implants: Secondary | ICD-10-CM

## 2019-05-12 DIAGNOSIS — M19012 Primary osteoarthritis, left shoulder: Secondary | ICD-10-CM | POA: Diagnosis not present

## 2019-05-12 DIAGNOSIS — I110 Hypertensive heart disease with heart failure: Secondary | ICD-10-CM | POA: Diagnosis not present

## 2019-05-12 DIAGNOSIS — M47811 Spondylosis without myelopathy or radiculopathy, occipito-atlanto-axial region: Secondary | ICD-10-CM | POA: Diagnosis not present

## 2019-05-12 DIAGNOSIS — I5032 Chronic diastolic (congestive) heart failure: Secondary | ICD-10-CM | POA: Diagnosis not present

## 2019-05-12 DIAGNOSIS — M19011 Primary osteoarthritis, right shoulder: Secondary | ICD-10-CM | POA: Diagnosis not present

## 2019-05-12 DIAGNOSIS — E119 Type 2 diabetes mellitus without complications: Secondary | ICD-10-CM | POA: Diagnosis not present

## 2019-05-12 DIAGNOSIS — R1084 Generalized abdominal pain: Secondary | ICD-10-CM | POA: Diagnosis not present

## 2019-05-13 ENCOUNTER — Telehealth: Payer: Self-pay | Admitting: Oncology

## 2019-05-13 ENCOUNTER — Ambulatory Visit: Payer: Medicare Other | Admitting: Podiatry

## 2019-05-13 NOTE — Telephone Encounter (Signed)
Received a new patient referral from Dr. Watt Climes for a pancreatic mass. Damon Snyder has been cld and scheduled to see Dr. Benay Spice on 8/24 at 2pm. Both pt and his wife have agreed tot he appt date and time.

## 2019-05-14 ENCOUNTER — Telehealth: Payer: Self-pay

## 2019-05-14 NOTE — Telephone Encounter (Signed)
Called patient to introduce myself and role of navigator. Explained initial consult D/T/L and reviewed visitor restrictions. Provided direct contact number for questions or concerns.

## 2019-05-15 ENCOUNTER — Telehealth: Payer: Self-pay

## 2019-05-15 ENCOUNTER — Other Ambulatory Visit: Payer: Self-pay

## 2019-05-15 ENCOUNTER — Emergency Department (HOSPITAL_COMMUNITY)
Admission: EM | Admit: 2019-05-15 | Discharge: 2019-05-15 | Disposition: A | Discharge status: Home / Self Care | Payer: Medicare Other | Attending: Emergency Medicine | Admitting: Emergency Medicine

## 2019-05-15 DIAGNOSIS — E876 Hypokalemia: Secondary | ICD-10-CM | POA: Diagnosis not present

## 2019-05-15 DIAGNOSIS — Z8546 Personal history of malignant neoplasm of prostate: Secondary | ICD-10-CM | POA: Insufficient documentation

## 2019-05-15 DIAGNOSIS — E11649 Type 2 diabetes mellitus with hypoglycemia without coma: Secondary | ICD-10-CM | POA: Insufficient documentation

## 2019-05-15 DIAGNOSIS — Z7982 Long term (current) use of aspirin: Secondary | ICD-10-CM | POA: Insufficient documentation

## 2019-05-15 DIAGNOSIS — Z85038 Personal history of other malignant neoplasm of large intestine: Secondary | ICD-10-CM | POA: Diagnosis not present

## 2019-05-15 DIAGNOSIS — I10 Essential (primary) hypertension: Secondary | ICD-10-CM | POA: Diagnosis not present

## 2019-05-15 DIAGNOSIS — Z79899 Other long term (current) drug therapy: Secondary | ICD-10-CM | POA: Insufficient documentation

## 2019-05-15 DIAGNOSIS — Z794 Long term (current) use of insulin: Secondary | ICD-10-CM | POA: Diagnosis not present

## 2019-05-15 DIAGNOSIS — R451 Restlessness and agitation: Secondary | ICD-10-CM

## 2019-05-15 DIAGNOSIS — E162 Hypoglycemia, unspecified: Secondary | ICD-10-CM

## 2019-05-15 LAB — URINALYSIS, ROUTINE W REFLEX MICROSCOPIC
Bilirubin Urine: NEGATIVE
Glucose, UA: NEGATIVE mg/dL
Hgb urine dipstick: NEGATIVE
Ketones, ur: NEGATIVE mg/dL
Leukocytes,Ua: NEGATIVE
Nitrite: NEGATIVE
Protein, ur: NEGATIVE mg/dL
Specific Gravity, Urine: 1.012 (ref 1.005–1.030)
pH: 5 (ref 5.0–8.0)

## 2019-05-15 LAB — CBC WITH DIFFERENTIAL/PLATELET
Abs Immature Granulocytes: 0.04 10*3/uL (ref 0.00–0.07)
Basophils Absolute: 0.1 10*3/uL (ref 0.0–0.1)
Basophils Relative: 1 %
Eosinophils Absolute: 0.3 10*3/uL (ref 0.0–0.5)
Eosinophils Relative: 4 %
HCT: 34.8 % — ABNORMAL LOW (ref 39.0–52.0)
Hemoglobin: 10.6 g/dL — ABNORMAL LOW (ref 13.0–17.0)
Immature Granulocytes: 1 %
Lymphocytes Relative: 9 %
Lymphs Abs: 0.7 10*3/uL (ref 0.7–4.0)
MCH: 27.7 pg (ref 26.0–34.0)
MCHC: 30.5 g/dL (ref 30.0–36.0)
MCV: 91.1 fL (ref 80.0–100.0)
Monocytes Absolute: 0.6 10*3/uL (ref 0.1–1.0)
Monocytes Relative: 7 %
Neutro Abs: 6.1 10*3/uL (ref 1.7–7.7)
Neutrophils Relative %: 78 %
Platelets: 181 10*3/uL (ref 150–400)
RBC: 3.82 MIL/uL — ABNORMAL LOW (ref 4.22–5.81)
RDW: 15 % (ref 11.5–15.5)
WBC: 7.8 10*3/uL (ref 4.0–10.5)
nRBC: 0 % (ref 0.0–0.2)

## 2019-05-15 LAB — COMPREHENSIVE METABOLIC PANEL
ALT: 76 U/L — ABNORMAL HIGH (ref 0–44)
AST: 48 U/L — ABNORMAL HIGH (ref 15–41)
Albumin: 3 g/dL — ABNORMAL LOW (ref 3.5–5.0)
Alkaline Phosphatase: 334 U/L — ABNORMAL HIGH (ref 38–126)
Anion gap: 13 (ref 5–15)
BUN: 20 mg/dL (ref 8–23)
CO2: 21 mmol/L — ABNORMAL LOW (ref 22–32)
Calcium: 8.8 mg/dL — ABNORMAL LOW (ref 8.9–10.3)
Chloride: 105 mmol/L (ref 98–111)
Creatinine, Ser: 1.61 mg/dL — ABNORMAL HIGH (ref 0.61–1.24)
GFR calc Af Amer: 43 mL/min — ABNORMAL LOW (ref >60)
GFR calc non Af Amer: 37 mL/min — ABNORMAL LOW (ref >60)
Glucose, Bld: 150 mg/dL — ABNORMAL HIGH (ref 70–99)
Potassium: 3.3 mmol/L — ABNORMAL LOW (ref 3.5–5.1)
Sodium: 139 mmol/L (ref 135–145)
Total Bilirubin: 2.1 mg/dL — ABNORMAL HIGH (ref 0.3–1.2)
Total Protein: 6.5 g/dL (ref 6.5–8.1)

## 2019-05-15 LAB — CBG MONITORING, ED
Glucose-Capillary: 115 mg/dL — ABNORMAL HIGH (ref 70–99)
Glucose-Capillary: 133 mg/dL — ABNORMAL HIGH (ref 70–99)
Glucose-Capillary: 186 mg/dL — ABNORMAL HIGH (ref 70–99)

## 2019-05-15 MED ORDER — SODIUM CHLORIDE 0.9 % IV BOLUS
500.0000 mL | Freq: Once | INTRAVENOUS | Status: AC
  Administered 2019-05-15: 500 mL via INTRAVENOUS

## 2019-05-15 MED ORDER — POTASSIUM CHLORIDE CRYS ER 20 MEQ PO TBCR
40.0000 meq | EXTENDED_RELEASE_TABLET | Freq: Once | ORAL | Status: AC
  Administered 2019-05-15: 14:00:00 40 meq via ORAL
  Filled 2019-05-15: qty 2

## 2019-05-15 NOTE — ED Triage Notes (Signed)
Patient's wife, Estill Batten, may be reached at [phone]. She adds that patient has been non-compliant with eating/drinking and taking medications, including insulin, since recent surgery a couple weeks ago. She states that he just found out he has a tumor in his abdomen and since, he's been acting very angry toward her.

## 2019-05-15 NOTE — Telephone Encounter (Signed)
Called wife, Louretta Parma, to offer support. Patient in  ED this AM. She reports that since his ERCP on 8/10, patient has been "acting strange", not eating or taking his medications as directed. His blood sugars have been trending lower than normal. She reports that over the last couple days he has been cursing her and "acting hateful" which is totally out of character. Patient's PCP is Dr. Brigitte Pulse but pt does not want to return to her. Wife has made appointment with a provider at [facility] but the appointment is not until mid September.  Patient is scheduled to have initial medical oncology consult with Dr. Benay Spice on 8/24 @ 2 PM at [facility]. [name] has my contact information for support.  Arna Snipe, MS Ed.S, RN  Gastrointestinal Oncology Nurse [facility] Phone: [phone]

## 2019-05-15 NOTE — ED Notes (Addendum)
Pt's wife states that pt hasn't been eating/drinking well and his blood sugar was 53 last night, pt did not take insulin last night. This morning, pt blood sugar was in 80s but patient was dizzy, agitated and knocking things down, and per wife saying bad words. Per wife pt's blood sugar was never in the 20s

## 2019-05-15 NOTE — Discharge Instructions (Addendum)
You were seen in the ED for having low blood sugars and an episode of agitation.  Your labs looked okay, we gave you some potassium and some fluids.  Your urine looked fine, and your exam has looked normal while you were here.  Follow your doctor's instructions to decrease your insulin to 50u and call your primary care doctor tomorrow for an appointment about your blood sugars.  Get some orange juice to keep at home incase you have low sugars.  You can also drink Ensure to help keep your intake up.  Return to care if you have worsening in your symptoms, worsening confusion, new pain, low blood sugars that do not improve.

## 2019-05-15 NOTE — ED Notes (Signed)
Checked patient cbg it was 115 notified RN Brooke of blood sugar

## 2019-05-15 NOTE — ED Notes (Signed)
Pt verbalized understanding of d/c instructions. Pt d/c to home with wife

## 2019-05-15 NOTE — ED Notes (Signed)
Pt given urinal and attempting to give urine specimen.

## 2019-05-15 NOTE — ED Triage Notes (Signed)
Patient states he's been having trouble with his blood sugars for 2 weeks and per wife, he had a reading in the 20s sometime this morning. Patient denies any new symptoms or pain. NAD noted. A&O x 4.

## 2019-05-15 NOTE — ED Notes (Signed)
Pt given water and sandwich bag

## 2019-05-15 NOTE — ED Provider Notes (Signed)
[place] EMERGENCY DEPARTMENT Provider Note   CSN: [id number] Arrival date & time: 05/15/19  0840     History   Chief Complaint Chief Complaint  Patient presents with  . Hypoglycemia    HPI Damon Kersten. is a 85 y.o. male, PMH colon cancer s/p partial colectomy, chemotherapy, prostate cancer s/p radiation, HTN, HLD, T2 DM, OA, PVD.  History provided by patient and his wife.  Patient and his wife report that for the last 2 days, patient has been having intermittently low blood sugars.  Patient has been taking Lantus 64 units nightly, notes that blood sugar yesterday morning around 6 AM was 47.  Wife called EMS, patient was given dextrose and had improvement.  He then notes sometime in the evening, unsure of exact time, also had CBG 53.  Patient drank cranberry juice and had improvement.  Does report that when he has hypoglycemia, he feels dizzy.  He did not take his Lantus last night.  CBG this a.m. and 80s.  Patient presented to ED after having an episode of agitation.  Patient states he "remembers a little bit" of this, but otherwise stated that he did not have control over what he was saying and doing at the time.  Denies loss of consciousness.  His wife states "he was knocking things down and cursing."  Wife also reports that over the last week, since discharge from the hospital, patient has been getting progressively more weak and tired after exertion.  Patient denies current shortness of breath, chest pain.  Patient does endorse some mild epigastric abdominal pain, that worsens after eating.  Patient says he has had a decrease in his appetite over the last few weeks.  Patient also endorses that his urine has been darker, denies frank blood, and stool has been darker, denies melena, states "dark brown."  Wife has been in contact with his PCP, was advised yesterday to decrease Lantus to 50 units nightly, but patient did not take last night due to low sugar.   Patient with recent admission 8/8 to 8/12 found to have likely pancreatic head mass consistent with adenocarcinoma and possible liver/lung nodules concerning for metastasis of colon cancer with elevated CA 19-9 and CEA.  He was noted to be hypoglycemic during his hospitalization, Lantus was adjusted as necessary, given the patient had not had a history of hypoglycemia at home, was discharged home on his home regimen as patient's diet at home would likely be different.  Past Medical History:  Diagnosis Date  . Anemia    h/o   . Arthritis    "left knee; shoulders" (09/14/2016)  . Colon cancer ([place])     tx : chemo)   . Difficulty in walking(719.7)   . Dyspnea    relative to pain in her leg  . Foot ulcer ([place])   . Gout   . High cholesterol   . History of blood transfusion    "that's when they found out about the colon cancer"  . Hypertension   . Lower GI bleed   . Prostate cancer [facility])     treated with radiation   . Type II diabetes mellitus [facility])     Patient Active Problem List   Diagnosis Date Noted  . Pancreatic mass 05/03/2019  . Elevated LFTs 05/03/2019  . Failure to thrive in adult 05/03/2019  . Total knee replacement status 09/14/2016  . Primary osteoarthritis of left knee   . Bilateral leg weakness 05/21/2015  . Atherosclerosis  of native arteries of extremity with intermittent claudication ([place]) 11/13/2014  . Pain in lower limb 01/09/2014  . Chronic diastolic heart failure ([place]) 09/23/2013  . Near syncope 09/22/2013  . Essential hypertension, benign 09/22/2013  . Type 2 diabetes mellitus without complication, with long-term current use of insulin ([place]) 09/22/2013  . Pain in joint, ankle and foot 04/01/2013  . PVD (peripheral vascular disease) ([place]) 12/31/2012  . Onychomycosis due to dermatophyte 12/31/2012    Past Surgical History:  Procedure Laterality Date  . BILIARY BRUSHING  05/05/2019   Procedure: BILIARY BRUSHING;  Surgeon: Clarene Essex, MD;  Location: [facility]  ENDOSCOPY;  Service: Endoscopy;;  . BILIARY STENT PLACEMENT  05/05/2019   Procedure: BILIARY STENT PLACEMENT;  Surgeon: Clarene Essex, MD;  Location: [place];  Service: Endoscopy;;  . CATARACT EXTRACTION W/ INTRAOCULAR LENS  IMPLANT, BILATERAL Bilateral   . COLECTOMY    . ENDOSCOPIC RETROGRADE CHOLANGIOPANCREATOGRAPHY (ERCP) WITH PROPOFOL N/A 05/05/2019   Procedure: ENDOSCOPIC RETROGRADE CHOLANGIOPANCREATOGRAPHY (ERCP) WITH PROPOFOL;  Surgeon: Clarene Essex, MD;  Location: [place];  Service: Endoscopy;  Laterality: N/A;  . JOINT REPLACEMENT    . PANCREATIC STENT PLACEMENT  05/05/2019   Procedure: PANCREATIC STENT PLACEMENT;  Surgeon: Clarene Essex, MD;  Location: [place];  Service: Endoscopy;;  . PROSTATE BIOPSY    . SPHINCTEROTOMY  05/05/2019   Procedure: SPHINCTEROTOMY;  Surgeon: Clarene Essex, MD;  Location: [facility] ENDOSCOPY;  Service: Endoscopy;;  . TOTAL KNEE ARTHROPLASTY Left 09/14/2016  . TOTAL KNEE ARTHROPLASTY Left 09/14/2016   Procedure: LEFT TOTAL KNEE ARTHROPLASTY;  Surgeon: Leandrew Koyanagi, MD;  Location: [place];  Service: Orthopedics;  Laterality: Left;        Home Medications    Prior to Admission medications   Medication Sig Start Date End Date Taking? Authorizing Provider  allopurinol (ZYLOPRIM) 100 MG tablet Take 200 mg by mouth daily as needed (gout).     [provider]  aspirin EC 325 MG tablet Take 1 tablet (325 mg total) by mouth 2 (two) times daily. 09/14/16   Leandrew Koyanagi, MD  atorvastatin (LIPITOR) 20 MG tablet Take 20 mg by mouth daily.    [provider]  diclofenac sodium (VOLTAREN) 1 % GEL Apply 4 g topically 4 (four) times daily as needed. Patient taking differently: Apply 2 g topically 4 (four) times daily as needed (for pain).  04/21/19   Hilts, Legrand Como, MD  diphenhydrAMINE (BENADRYL) 25 mg capsule Take 25 mg by mouth every 6 (six) hours as needed for itching or allergies.    [provider]  LANTUS SOLOSTAR 100 UNIT/ML Solostar  Pen Inject 64 Units into the skin at bedtime.  11/17/14   [provider]  Nebivolol HCl (BYSTOLIC) 20 MG TABS Take 20 mg by mouth daily.    [provider]  NIFEdipine (ADALAT CC) 60 MG 24 hr tablet Take 1 tablet (60 mg total) by mouth daily. 05/08/19   Monica Becton, MD  NOVOFINE 32G X 6 MM MISC  03/11/13   [provider]  [facility] VERIO test strip 1 STRIP TWICE A DAY FOR BLOOD SUGAR DX E11.22 FINGERSTICK 09/09/18   [provider]  sodium chloride (OCEAN) 0.65 % SOLN nasal spray Place 1 spray into both nostrils as needed for congestion.    [provider]  tamsulosin (FLOMAX) 0.4 MG CAPS capsule Take 1 capsule (0.4 mg total) by mouth daily after supper. 05/07/19   Monica Becton, MD    Family History Family History  Problem Relation  Age of Onset  . Hypertension Mother   . Cancer Sister        Ovarian  . Hypertension Father   . Heart disease Brother        After age 20  . Kidney disease Sister     Social History Social History   Tobacco Use  . Smoking status: Never Smoker  . Smokeless tobacco: Never Used  Substance Use Topics  . Alcohol use: No    Alcohol/week: 0.0 standard drinks  . Drug use: No     Allergies   Patient has no known allergies.   Review of Systems Review of Systems  Constitutional: Positive for appetite change and fatigue. Negative for chills and fever.  HENT: Negative for congestion and sore throat.   Eyes: Negative for visual disturbance.  Respiratory: Negative for cough, chest tightness and shortness of breath.   Cardiovascular: Negative for chest pain and leg swelling.  Gastrointestinal: Positive for abdominal pain. Negative for anal bleeding, blood in stool, constipation, diarrhea, nausea and vomiting.  Genitourinary: Negative for difficulty urinating, dysuria and hematuria.  Musculoskeletal: Positive for arthralgias (chronic, left hip pain). Negative for myalgias.  Neurological: Positive for  weakness (generalized). Negative for dizziness, syncope and headaches.  Psychiatric/Behavioral: Positive for agitation. Negative for hallucinations.     Physical Exam Updated Vital Signs BP (!) 126/56   Pulse 66   Temp 98.5 F (36.9 C) (Oral)   Resp 16   SpO2 100%   Physical Exam Constitutional:      General: He is not in acute distress.    Appearance: He is not ill-appearing.  HENT:     Head: Normocephalic and atraumatic.     Mouth/Throat:     Mouth: Mucous membranes are moist.     Pharynx: No posterior oropharyngeal erythema.  Eyes:     General: No scleral icterus.    Extraocular Movements: Extraocular movements intact.     Pupils: Pupils are equal, round, and reactive to light.  Neck:     Musculoskeletal: Normal range of motion and neck supple. No muscular tenderness.     Comments: Right supraclavicular lymphadenopathy Cardiovascular:     Rate and Rhythm: Normal rate and regular rhythm.     Heart sounds: No murmur. No friction rub. No gallop.   Pulmonary:     Effort: Pulmonary effort is normal.     Breath sounds: Normal breath sounds. No wheezing, rhonchi or rales.  Abdominal:     General: Bowel sounds are normal.     Palpations: Abdomen is soft.     Tenderness: There is abdominal tenderness (RUQ, epigastric, LUQ). There is no guarding or rebound.     Comments: Surgical incision at umbilicus well-healed  Musculoskeletal:     Right lower leg: No edema.     Left lower leg: No edema.     Comments: 5/5 strength BUE/RLE, chronic LLE weakness 4/5 and decreased ROM in hip flexion  Skin:    Coloration: Skin is not jaundiced (no overt jaundice).  Neurological:     Mental Status: He is alert and oriented to person, place, and time. Mental status is at baseline.     Cranial Nerves: No cranial nerve deficit.  Psychiatric:        Mood and Affect: Mood normal.        Behavior: Behavior normal.      ED Treatments / Results  Labs (all labs ordered are listed, but only  abnormal results are displayed) Labs Reviewed  COMPREHENSIVE METABOLIC  PANEL - Abnormal; Notable for the following components:      Result Value   Potassium 3.3 (*)    CO2 21 (*)    Glucose, Bld 150 (*)    Creatinine, Ser 1.61 (*)    Calcium 8.8 (*)    Albumin 3.0 (*)    AST 48 (*)    ALT 76 (*)    Alkaline Phosphatase 334 (*)    Total Bilirubin 2.1 (*)    GFR calc non Af Amer 37 (*)    GFR calc Af Amer 43 (*)    All other components within normal limits  CBC WITH DIFFERENTIAL/PLATELET - Abnormal; Notable for the following components:   RBC 3.82 (*)    Hemoglobin 10.6 (*)    HCT 34.8 (*)    All other components within normal limits  CBG MONITORING, ED - Abnormal; Notable for the following components:   Glucose-Capillary 115 (*)    All other components within normal limits  CBG MONITORING, ED - Abnormal; Notable for the following components:   Glucose-Capillary 133 (*)    All other components within normal limits  URINALYSIS, ROUTINE W REFLEX MICROSCOPIC    EKG None  Radiology No results found.  Procedures Procedures (including critical care time)  Medications Ordered in ED Medications  sodium chloride 0.9 % bolus 500 mL (500 mLs Intravenous New Bag/Given 05/15/19 1357)  potassium chloride SA (K-DUR) CR tablet 40 mEq (40 mEq Oral Given 05/15/19 1354)     Initial Impression / Assessment and Plan / ED Course  I have reviewed the triage vital signs and the nursing notes.  Pertinent labs & imaging results that were available during my care of the patient were reviewed by me and considered in my medical decision making (see chart for details).  Damon Clerk. is a 85 yo male with PMH recent pancreatic mass with possible lung mets of colon cancer, PMH colon cancer s/p partial colectomy and chemo, prostate cancer s/p radiation, HTN, HLD, T2DM, OA, PVD, who presents with episodes of hypoglycemia yesterday and episode of agitation this a.m. with 1 week of worsening weakness  and poor appetite.  Patient CBG now within normal limits, no hypoglycemia this a.m. after holding Lantus last p.m.  Patient's primary concern is the episode of agitation this a.m., but patient appears to be at baseline this morning and without neurologic deficit.  Will obtain screening labs, including CMP, CBC, UA given dark appearance of urine.  CT head performed on 8/8 without acute abnormalities.  Patient has not yet had PET scan, but is scheduled to see Oncologist on 8/24.  Decreased appetite and patient's other concerns are likely secondary to his progressive cancer that is currently being worked up as outpatient.  For hypoglycemia, wife reports PCP had instructed to decrease Lantus to 50 units, given this, will discharge with this regimen and have patient follow-up with PCP soon.  CMP notable for mild hypokalemia to 3.3, patient given K-Dur 40 mEq x 1.  Patient also with mild increase in his creatinine from 1.5-1.6 in the last 9 days.  We will give patient 500 cc bolus of normal saline.  All other abnormalities seem to be improving, including CO2, AST/ALT, bilirubin.  CBC with WBC within normal limits, mildly decreased hemoglobin from 11.5-10.6.  Vital signs remained stable.  UA WNL.  Patient reassessed after fluid bolus, he has been asymptomatic and at mental baseline during his time in the ED.  Vital signs are stable.  No indication  for hospitalization at this time.  Spoke with patient and his wife, advised him to follow-up with oncology as scheduled on 8/24 and 2 follow PCPs instructions to decrease Lantus to 50 units.  Also advised to follow-up with PCP in 1-4 days continue to encourage p.o. intake, suggested Ensure.  Patient and his wife voiced understanding of plan, and agreed to this.  Patient will be discharged home.  Final Clinical Impressions(s) / ED Diagnoses   Final diagnoses:  Hypokalemia  Hypoglycemia  Agitation    ED Discharge Orders    None       Cleophas Dunker, DO  05/15/19 1427    Elnora Morrison, MD 05/15/19 [phone]

## 2019-05-16 ENCOUNTER — Telehealth: Payer: Self-pay

## 2019-05-16 DIAGNOSIS — M47811 Spondylosis without myelopathy or radiculopathy, occipito-atlanto-axial region: Secondary | ICD-10-CM | POA: Diagnosis not present

## 2019-05-16 DIAGNOSIS — I5032 Chronic diastolic (congestive) heart failure: Secondary | ICD-10-CM | POA: Diagnosis not present

## 2019-05-16 DIAGNOSIS — M19011 Primary osteoarthritis, right shoulder: Secondary | ICD-10-CM | POA: Diagnosis not present

## 2019-05-16 DIAGNOSIS — M4316 Spondylolisthesis, lumbar region: Secondary | ICD-10-CM | POA: Diagnosis not present

## 2019-05-16 DIAGNOSIS — I110 Hypertensive heart disease with heart failure: Secondary | ICD-10-CM | POA: Diagnosis not present

## 2019-05-16 DIAGNOSIS — M160 Bilateral primary osteoarthritis of hip: Secondary | ICD-10-CM | POA: Diagnosis not present

## 2019-05-16 DIAGNOSIS — K8689 Other specified diseases of pancreas: Secondary | ICD-10-CM | POA: Diagnosis not present

## 2019-05-16 DIAGNOSIS — M19012 Primary osteoarthritis, left shoulder: Secondary | ICD-10-CM | POA: Diagnosis not present

## 2019-05-16 DIAGNOSIS — E119 Type 2 diabetes mellitus without complications: Secondary | ICD-10-CM | POA: Diagnosis not present

## 2019-05-16 NOTE — Telephone Encounter (Signed)
Returned Damon Snyder's telephone call. She provided update on Damon Snyder and is looking forward to apt on 8/24 with Dr. Benay Spice. Was able to speak with Damon Snyder and confirmed apt on 8/24 is at 2 PM. He understands to arrive by 1:45 PM.

## 2019-05-19 ENCOUNTER — Telehealth: Payer: Self-pay

## 2019-05-19 ENCOUNTER — Other Ambulatory Visit: Payer: Self-pay

## 2019-05-19 ENCOUNTER — Inpatient Hospital Stay: Payer: Medicare Other | Attending: Oncology | Admitting: Oncology

## 2019-05-19 VITALS — BP 131/65 | HR 86 | Temp 98.4°F | Resp 18 | Ht 76.0 in | Wt 227.6 lb

## 2019-05-19 DIAGNOSIS — R630 Anorexia: Secondary | ICD-10-CM | POA: Insufficient documentation

## 2019-05-19 DIAGNOSIS — E119 Type 2 diabetes mellitus without complications: Secondary | ICD-10-CM | POA: Diagnosis not present

## 2019-05-19 DIAGNOSIS — Z85038 Personal history of other malignant neoplasm of large intestine: Secondary | ICD-10-CM | POA: Diagnosis not present

## 2019-05-19 DIAGNOSIS — G893 Neoplasm related pain (acute) (chronic): Secondary | ICD-10-CM | POA: Insufficient documentation

## 2019-05-19 DIAGNOSIS — Z923 Personal history of irradiation: Secondary | ICD-10-CM | POA: Insufficient documentation

## 2019-05-19 DIAGNOSIS — E7800 Pure hypercholesterolemia, unspecified: Secondary | ICD-10-CM | POA: Insufficient documentation

## 2019-05-19 DIAGNOSIS — I10 Essential (primary) hypertension: Secondary | ICD-10-CM | POA: Insufficient documentation

## 2019-05-19 DIAGNOSIS — Z8546 Personal history of malignant neoplasm of prostate: Secondary | ICD-10-CM | POA: Diagnosis not present

## 2019-05-19 DIAGNOSIS — C7802 Secondary malignant neoplasm of left lung: Secondary | ICD-10-CM | POA: Diagnosis not present

## 2019-05-19 DIAGNOSIS — Z9221 Personal history of antineoplastic chemotherapy: Secondary | ICD-10-CM | POA: Insufficient documentation

## 2019-05-19 DIAGNOSIS — M109 Gout, unspecified: Secondary | ICD-10-CM | POA: Insufficient documentation

## 2019-05-19 DIAGNOSIS — R0600 Dyspnea, unspecified: Secondary | ICD-10-CM | POA: Diagnosis not present

## 2019-05-19 DIAGNOSIS — C250 Malignant neoplasm of head of pancreas: Secondary | ICD-10-CM | POA: Insufficient documentation

## 2019-05-19 DIAGNOSIS — C787 Secondary malignant neoplasm of liver and intrahepatic bile duct: Secondary | ICD-10-CM | POA: Diagnosis not present

## 2019-05-19 DIAGNOSIS — K8689 Other specified diseases of pancreas: Secondary | ICD-10-CM

## 2019-05-19 MED ORDER — OXYCODONE HCL 5 MG PO TABS: 5.0000 mg | ORAL_TABLET | ORAL | 0 refills | Status: AC | PRN

## 2019-05-19 MED ORDER — ONDANSETRON HCL 8 MG PO TABS: 8.0000 mg | ORAL_TABLET | Freq: Three times a day (TID) | ORAL | 3 refills | Status: AC | PRN

## 2019-05-19 NOTE — Progress Notes (Signed)
Damon Snyder New Patient Consult   Requesting ND:[id number] Damon Snyder  Damon Snyder. 85 y.o.  11-Dec-1927    Reason for Consult: Pancreas cancer   HPI: Damon Snyder reports abdominal pain and anorexia/weight loss for the past.  Damon Snyder presented to the room on 05/03/2019.  Damon Snyder was noted to have elevated liver enzymes.  A CT of the abdomen/pelvis revealed a distended gallbladder with intrahepatic and extrahepatic ductal dilatation.  Multiple hypoattenuation liver lesions were noted.  Ill-defined soft tissue density at the pancreas head.  Multiple pulmonary nodules in the left lower lung. MRI of the abdomen on 05/04/2019 revealed bilateral liver lesions consistent with metastases.  Mild intrahepatic biliary duct dilatation.  There is pancreas atrophy with dilatation of the main duct, neck, and tail.  There is a 4.4 x 4 cm pancreas head mass.  The mass involves the superior mesenteric vein and splenoportal confluence.  No adenopathy.  No omental or peritoneal disease.  No ascites.  Left renal lesion is indeterminate. Damon Snyder was referred to Dr. Watt Snyder for an ERCP on 05/05/2019.  There is a bile duct stricture.  A common bile duct stent was placed and brushings were obtained.  The cytology was suspicious for malignancy.  Damon Snyder is referred for oncology evaluation.  Damon Snyder was seen in the emergency room on 05/15/2019 with agitation and anorexia.  Past Medical History:  Diagnosis Date  . Anemia    h/o   . Arthritis    "left knee; shoulders" (09/14/2016)  . Colon cancer ([place])     tx : chemo)   . Difficulty in walking(719.7)   . Dyspnea    relative to pain in her leg  . Foot ulcer ([place])   . Gout   . High cholesterol   . History of blood transfusion    "that's when they found out about the colon cancer"  . Hypertension   . Lower GI bleed   . Prostate cancer [facility])     treated with radiation   . Type II diabetes mellitus ([place])     Past Surgical History:  Procedure Laterality Date  . BILIARY BRUSHING   05/05/2019   Procedure: BILIARY BRUSHING;  Surgeon: Damon Essex, MD;  Location: [facility] ENDOSCOPY;  Service: Endoscopy;;  . BILIARY STENT PLACEMENT  05/05/2019   Procedure: BILIARY STENT PLACEMENT;  Surgeon: Damon Essex, MD;  Location: [place];  Service: Endoscopy;;  . CATARACT EXTRACTION W/ INTRAOCULAR LENS  IMPLANT, BILATERAL Bilateral   . COLECTOMY    . ENDOSCOPIC RETROGRADE CHOLANGIOPANCREATOGRAPHY (ERCP) WITH PROPOFOL N/A 05/05/2019   Procedure: ENDOSCOPIC RETROGRADE CHOLANGIOPANCREATOGRAPHY (ERCP) WITH PROPOFOL;  Surgeon: Damon Essex, MD;  Location: [place];  Service: Endoscopy;  Laterality: N/A;  . JOINT REPLACEMENT    . PANCREATIC STENT PLACEMENT  05/05/2019   Procedure: PANCREATIC STENT PLACEMENT;  Surgeon: Damon Essex, MD;  Location: [place];  Service: Endoscopy;;  . PROSTATE BIOPSY    . SPHINCTEROTOMY  05/05/2019   Procedure: SPHINCTEROTOMY;  Surgeon: Damon Essex, MD;  Location: [facility] ENDOSCOPY;  Service: Endoscopy;;  . TOTAL KNEE ARTHROPLASTY Left 09/14/2016  . TOTAL KNEE ARTHROPLASTY Left 09/14/2016   Procedure: LEFT TOTAL KNEE ARTHROPLASTY;  Surgeon: Leandrew Koyanagi, MD;  Location: [place];  Service: Orthopedics;  Laterality: Left;    Medications: Reviewed  Allergies: No Known Allergies  Family history: No family history of cancer  Social History:   Damon Snyder lives with his wife in [place].  Damon Snyder is retired from the [employer].  Damon Snyder does not smoke  cigarettes or drink alcohol.  Damon Snyder has a history of a red cell transfusion.  ROS:   Positives include: Anorexia, 25-30 pound weight loss, constipation, nausea abdomen and anterior chest pain, "floaters "in the eyes, dyspnea   A complete ROS was otherwise negative.  Physical Exam:  Blood pressure 131/65, pulse 86, temperature 98.4 F (36.9 C), resp. rate 18, height 6\' 4"  (1.93 m), weight 227 lb 9.6 oz (103.2 kg), SpO2 99 %. Limited physical examination secondary to distancing with the COVID pandemic Lungs: Clear bilaterally,  no respiratory distress Cardiac: Regular rate and rhythm Abdomen: No hepatosplenomegaly, tender in the mid upper abdomen, no apparent ascites GU: Testes without mass Vascular: No leg edema Lymph nodes: No cervical, supraclavicular, axillary, or inguinal nodes Neurologic: Alert and oriented, motor exam appears intact in the upper and lower extremities bilaterally Skin: No rash Musculoskeletal: Spine tenderness   LAB:  CBC  Lab Results  Component Value Date   WBC 7.8 05/15/2019   HGB 10.6 (L) 05/15/2019   HCT 34.8 (L) 05/15/2019   MCV 91.1 05/15/2019   PLT 181 05/15/2019   NEUTROABS 6.1 05/15/2019        CMP  Lab Results  Component Value Date   NA 139 05/15/2019   K 3.3 (L) 05/15/2019   CL 105 05/15/2019   CO2 21 (L) 05/15/2019   GLUCOSE 150 (H) 05/15/2019   BUN 20 05/15/2019   CREATININE 1.61 (H) 05/15/2019   CALCIUM 8.8 (L) 05/15/2019   PROT 6.5 05/15/2019   ALBUMIN 3.0 (L) 05/15/2019   AST 48 (H) 05/15/2019   ALT 76 (H) 05/15/2019   ALKPHOS 334 (H) 05/15/2019   BILITOT 2.1 (H) 05/15/2019   GFRNONAA 37 (L) 05/15/2019   GFRAA 43 (L) 05/15/2019     Lab Results  Component Value Date   CEA1 240.0 (H) 05/04/2019   CA 19-9 on 05/04/2019: 502 Imaging: CT and MRI images were viewed   Assessment/Plan:   1. Pancreas cancer  Pancreas head mass, biliary ductal dilatation, left lung nodules, and liver lesions noted on a CT abdomen/pelvis 05/03/2019  MRI abdomen 05/04/2019- pancreas mass, biliary and pancreatic duct dilatation, bilateral hepatic metastases, indeterminate left upper pole renal lesion, left lung base nodules  ERCP 05/05/2019- duct stricture, placement of a common bile duct stent and duct brushings  Cytology brushings 05/05/2019-"suspicious "for malignancy 2. Anorexia/weight loss secondary to #1 3.   Pain secondary to #1 4.   Nausea 5.  Diabetes 6.  Right colon cancer, stage III (T4N2, N0) 2003, status post adjuvant chemotherapy 7.  Prostate cancer    Disposition:   [name] appears to have metastatic pancreas cancer presenting with a pancreas head mass and liver metastases.  Damon Snyder is symptomatic with nausea, anorexia, and abdominal pain.  I discussed the apparent diagnosis of pancreas cancer with Mr. Matheus.  His wife was present by telephone for today's visit.  They understand no therapy will be curative.  We discussed comfort/care versus a trial of systemic therapy.  Damon Snyder indicates that Damon Snyder would like to consider chemotherapy if biopsy of a liver lesion confirms pancreas cancer.  Damon Snyder will be referred for a diagnostic liver biopsy.  Damon Snyder will begin oxycodone for pain and ondansetron for nausea.  Damon Snyder will take a stool softener while taking oxycodone.  Mr. Chavanne will be referred for biopsy of the liver lesion.  Damon Snyder will return for an office visit to discuss treatment options proximately 10 days.  Ms. Selbe was present by telephone for today's visit.  Betsy Coder, MD  05/19/2019, 2:30 PM

## 2019-05-19 NOTE — Telephone Encounter (Signed)
[name] called to confirm appointment time for initial consult today. Her plan is to drop patient off at [facility] and return home to wait for call from Dr. Benay Spice. All questions answered to her satisfaction.

## 2019-05-20 ENCOUNTER — Telehealth: Payer: Self-pay

## 2019-05-20 ENCOUNTER — Observation Stay (HOSPITAL_COMMUNITY): Payer: Medicare Other

## 2019-05-20 ENCOUNTER — Inpatient Hospital Stay (HOSPITAL_COMMUNITY)
Admission: EM | Admit: 2019-05-20 | Discharge: 2019-05-24 | DRG: 436 | Disposition: A | Discharge status: Hospice | Payer: Medicare Other | Attending: Internal Medicine | Admitting: Internal Medicine

## 2019-05-20 ENCOUNTER — Encounter (HOSPITAL_COMMUNITY): Payer: Self-pay

## 2019-05-20 ENCOUNTER — Emergency Department (HOSPITAL_COMMUNITY): Payer: Medicare Other

## 2019-05-20 ENCOUNTER — Other Ambulatory Visit: Payer: Self-pay

## 2019-05-20 ENCOUNTER — Telehealth: Payer: Self-pay | Admitting: Oncology

## 2019-05-20 DIAGNOSIS — Z85038 Personal history of other malignant neoplasm of large intestine: Secondary | ICD-10-CM

## 2019-05-20 DIAGNOSIS — I10 Essential (primary) hypertension: Secondary | ICD-10-CM | POA: Diagnosis not present

## 2019-05-20 DIAGNOSIS — I130 Hypertensive heart and chronic kidney disease with heart failure and stage 1 through stage 4 chronic kidney disease, or unspecified chronic kidney disease: Secondary | ICD-10-CM | POA: Diagnosis present

## 2019-05-20 DIAGNOSIS — Z66 Do not resuscitate: Secondary | ICD-10-CM | POA: Diagnosis present

## 2019-05-20 DIAGNOSIS — Z923 Personal history of irradiation: Secondary | ICD-10-CM

## 2019-05-20 DIAGNOSIS — C787 Secondary malignant neoplasm of liver and intrahepatic bile duct: Secondary | ICD-10-CM | POA: Diagnosis not present

## 2019-05-20 DIAGNOSIS — E11649 Type 2 diabetes mellitus with hypoglycemia without coma: Secondary | ICD-10-CM | POA: Diagnosis not present

## 2019-05-20 DIAGNOSIS — I739 Peripheral vascular disease, unspecified: Secondary | ICD-10-CM | POA: Diagnosis not present

## 2019-05-20 DIAGNOSIS — Z9221 Personal history of antineoplastic chemotherapy: Secondary | ICD-10-CM

## 2019-05-20 DIAGNOSIS — E7800 Pure hypercholesterolemia, unspecified: Secondary | ICD-10-CM | POA: Diagnosis present

## 2019-05-20 DIAGNOSIS — Z794 Long term (current) use of insulin: Secondary | ICD-10-CM

## 2019-05-20 DIAGNOSIS — E1151 Type 2 diabetes mellitus with diabetic peripheral angiopathy without gangrene: Secondary | ICD-10-CM | POA: Diagnosis present

## 2019-05-20 DIAGNOSIS — K810 Acute cholecystitis: Secondary | ICD-10-CM | POA: Diagnosis present

## 2019-05-20 DIAGNOSIS — R0902 Hypoxemia: Secondary | ICD-10-CM | POA: Diagnosis not present

## 2019-05-20 DIAGNOSIS — Z841 Family history of disorders of kidney and ureter: Secondary | ICD-10-CM

## 2019-05-20 DIAGNOSIS — R7401 Elevation of levels of liver transaminase levels: Secondary | ICD-10-CM | POA: Diagnosis present

## 2019-05-20 DIAGNOSIS — K819 Cholecystitis, unspecified: Secondary | ICD-10-CM | POA: Diagnosis not present

## 2019-05-20 DIAGNOSIS — Z7982 Long term (current) use of aspirin: Secondary | ICD-10-CM

## 2019-05-20 DIAGNOSIS — K5900 Constipation, unspecified: Secondary | ICD-10-CM | POA: Diagnosis present

## 2019-05-20 DIAGNOSIS — E119 Type 2 diabetes mellitus without complications: Secondary | ICD-10-CM

## 2019-05-20 DIAGNOSIS — M109 Gout, unspecified: Secondary | ICD-10-CM | POA: Diagnosis present

## 2019-05-20 DIAGNOSIS — K8689 Other specified diseases of pancreas: Secondary | ICD-10-CM | POA: Diagnosis present

## 2019-05-20 DIAGNOSIS — S299XXA Unspecified injury of thorax, initial encounter: Secondary | ICD-10-CM | POA: Diagnosis not present

## 2019-05-20 DIAGNOSIS — R112 Nausea with vomiting, unspecified: Secondary | ICD-10-CM

## 2019-05-20 DIAGNOSIS — Z20828 Contact with and (suspected) exposure to other viral communicable diseases: Secondary | ICD-10-CM | POA: Diagnosis present

## 2019-05-20 DIAGNOSIS — D72829 Elevated white blood cell count, unspecified: Secondary | ICD-10-CM | POA: Diagnosis not present

## 2019-05-20 DIAGNOSIS — K8080 Other cholelithiasis without obstruction: Secondary | ICD-10-CM

## 2019-05-20 DIAGNOSIS — R740 Nonspecific elevation of levels of transaminase and lactic acid dehydrogenase [LDH]: Secondary | ICD-10-CM

## 2019-05-20 DIAGNOSIS — R1012 Left upper quadrant pain: Secondary | ICD-10-CM

## 2019-05-20 DIAGNOSIS — K6389 Other specified diseases of intestine: Secondary | ICD-10-CM | POA: Diagnosis not present

## 2019-05-20 DIAGNOSIS — C250 Malignant neoplasm of head of pancreas: Secondary | ICD-10-CM | POA: Diagnosis not present

## 2019-05-20 DIAGNOSIS — D649 Anemia, unspecified: Secondary | ICD-10-CM | POA: Diagnosis present

## 2019-05-20 DIAGNOSIS — N189 Chronic kidney disease, unspecified: Secondary | ICD-10-CM

## 2019-05-20 DIAGNOSIS — I5032 Chronic diastolic (congestive) heart failure: Secondary | ICD-10-CM | POA: Diagnosis not present

## 2019-05-20 DIAGNOSIS — E785 Hyperlipidemia, unspecified: Secondary | ICD-10-CM | POA: Diagnosis present

## 2019-05-20 DIAGNOSIS — R627 Adult failure to thrive: Secondary | ICD-10-CM | POA: Diagnosis not present

## 2019-05-20 DIAGNOSIS — K839 Disease of biliary tract, unspecified: Secondary | ICD-10-CM | POA: Diagnosis not present

## 2019-05-20 DIAGNOSIS — C259 Malignant neoplasm of pancreas, unspecified: Secondary | ICD-10-CM | POA: Diagnosis present

## 2019-05-20 DIAGNOSIS — Z96652 Presence of left artificial knee joint: Secondary | ICD-10-CM | POA: Diagnosis present

## 2019-05-20 DIAGNOSIS — E860 Dehydration: Secondary | ICD-10-CM | POA: Diagnosis present

## 2019-05-20 DIAGNOSIS — E1122 Type 2 diabetes mellitus with diabetic chronic kidney disease: Secondary | ICD-10-CM | POA: Diagnosis present

## 2019-05-20 DIAGNOSIS — N183 Chronic kidney disease, stage 3 (moderate): Secondary | ICD-10-CM | POA: Diagnosis present

## 2019-05-20 DIAGNOSIS — W19XXXA Unspecified fall, initial encounter: Secondary | ICD-10-CM | POA: Diagnosis not present

## 2019-05-20 DIAGNOSIS — K8020 Calculus of gallbladder without cholecystitis without obstruction: Secondary | ICD-10-CM | POA: Diagnosis not present

## 2019-05-20 DIAGNOSIS — Z8546 Personal history of malignant neoplasm of prostate: Secondary | ICD-10-CM

## 2019-05-20 DIAGNOSIS — R531 Weakness: Secondary | ICD-10-CM | POA: Diagnosis not present

## 2019-05-20 DIAGNOSIS — Z8249 Family history of ischemic heart disease and other diseases of the circulatory system: Secondary | ICD-10-CM

## 2019-05-20 DIAGNOSIS — K8050 Calculus of bile duct without cholangitis or cholecystitis without obstruction: Secondary | ICD-10-CM | POA: Diagnosis not present

## 2019-05-20 DIAGNOSIS — N179 Acute kidney failure, unspecified: Secondary | ICD-10-CM | POA: Diagnosis present

## 2019-05-20 DIAGNOSIS — Z8041 Family history of malignant neoplasm of ovary: Secondary | ICD-10-CM

## 2019-05-20 DIAGNOSIS — R1013 Epigastric pain: Secondary | ICD-10-CM | POA: Diagnosis not present

## 2019-05-20 DIAGNOSIS — D631 Anemia in chronic kidney disease: Secondary | ICD-10-CM | POA: Diagnosis present

## 2019-05-20 LAB — CBC WITH DIFFERENTIAL/PLATELET
Abs Immature Granulocytes: 0.13 10*3/uL — ABNORMAL HIGH (ref 0.00–0.07)
Basophils Absolute: 0 10*3/uL (ref 0.0–0.1)
Basophils Relative: 0 %
Eosinophils Absolute: 0 10*3/uL (ref 0.0–0.5)
Eosinophils Relative: 0 %
HCT: 30.4 % — ABNORMAL LOW (ref 39.0–52.0)
Hemoglobin: 9.6 g/dL — ABNORMAL LOW (ref 13.0–17.0)
Immature Granulocytes: 1 %
Lymphocytes Relative: 3 %
Lymphs Abs: 0.5 10*3/uL — ABNORMAL LOW (ref 0.7–4.0)
MCH: 28.6 pg (ref 26.0–34.0)
MCHC: 31.6 g/dL (ref 30.0–36.0)
MCV: 90.5 fL (ref 80.0–100.0)
Monocytes Absolute: 1 10*3/uL (ref 0.1–1.0)
Monocytes Relative: 5 %
Neutro Abs: 16.2 10*3/uL — ABNORMAL HIGH (ref 1.7–7.7)
Neutrophils Relative %: 91 %
Platelets: 229 10*3/uL (ref 150–400)
RBC: 3.36 MIL/uL — ABNORMAL LOW (ref 4.22–5.81)
RDW: 14.9 % (ref 11.5–15.5)
WBC: 17.8 10*3/uL — ABNORMAL HIGH (ref 4.0–10.5)
nRBC: 0 % (ref 0.0–0.2)

## 2019-05-20 LAB — COMPREHENSIVE METABOLIC PANEL
ALT: 55 U/L — ABNORMAL HIGH (ref 0–44)
AST: 60 U/L — ABNORMAL HIGH (ref 15–41)
Albumin: 2.6 g/dL — ABNORMAL LOW (ref 3.5–5.0)
Alkaline Phosphatase: 182 U/L — ABNORMAL HIGH (ref 38–126)
Anion gap: 16 — ABNORMAL HIGH (ref 5–15)
BUN: 61 mg/dL — ABNORMAL HIGH (ref 8–23)
CO2: 22 mmol/L (ref 22–32)
Calcium: 8.6 mg/dL — ABNORMAL LOW (ref 8.9–10.3)
Chloride: 97 mmol/L — ABNORMAL LOW (ref 98–111)
Creatinine, Ser: 2.59 mg/dL — ABNORMAL HIGH (ref 0.61–1.24)
GFR calc Af Amer: 24 mL/min — ABNORMAL LOW (ref >60)
GFR calc non Af Amer: 21 mL/min — ABNORMAL LOW (ref >60)
Glucose, Bld: 133 mg/dL — ABNORMAL HIGH (ref 70–99)
Potassium: 3.6 mmol/L (ref 3.5–5.1)
Sodium: 135 mmol/L (ref 135–145)
Total Bilirubin: 2 mg/dL — ABNORMAL HIGH (ref 0.3–1.2)
Total Protein: 6.8 g/dL (ref 6.5–8.1)

## 2019-05-20 LAB — GLUCOSE, CAPILLARY: Glucose-Capillary: 112 mg/dL — ABNORMAL HIGH (ref 70–99)

## 2019-05-20 LAB — POC OCCULT BLOOD, ED: Fecal Occult Bld: NEGATIVE

## 2019-05-20 LAB — MAGNESIUM: Magnesium: 2.3 mg/dL (ref 1.7–2.4)

## 2019-05-20 LAB — SARS CORONAVIRUS 2 (TAT 6-24 HRS): SARS Coronavirus 2: NEGATIVE

## 2019-05-20 MED ORDER — SODIUM CHLORIDE 0.9 % IV SOLN: Freq: Once | INTRAVENOUS | Status: DC

## 2019-05-20 MED ORDER — IBUPROFEN 200 MG PO TABS: 400.0000 mg | ORAL_TABLET | Freq: Four times a day (QID) | ORAL | Status: DC | PRN

## 2019-05-20 MED ORDER — ALBUTEROL SULFATE (2.5 MG/3ML) 0.083% IN NEBU: 2.5000 mg | INHALATION_SOLUTION | RESPIRATORY_TRACT | Status: DC | PRN

## 2019-05-20 MED ORDER — HEPARIN SODIUM (PORCINE) 5000 UNIT/ML IJ SOLN
5000.0000 [IU] | Freq: Three times a day (TID) | INTRAMUSCULAR | Status: DC
  Administered 2019-05-20 – 2019-05-24 (×10): 5000 [IU] via SUBCUTANEOUS
  Filled 2019-05-20 (×10): qty 1

## 2019-05-20 MED ORDER — OXYCODONE HCL 5 MG PO TABS: 5.0000 mg | ORAL_TABLET | ORAL | Status: DC | PRN

## 2019-05-20 MED ORDER — SORBITOL 70 % SOLN: 30.0000 mL | Freq: Every day | Status: DC | PRN

## 2019-05-20 MED ORDER — ONDANSETRON HCL 4 MG/2ML IJ SOLN
4.0000 mg | Freq: Four times a day (QID) | INTRAMUSCULAR | Status: DC | PRN
  Administered 2019-05-21 – 2019-05-22 (×2): 4 mg via INTRAVENOUS
  Filled 2019-05-20: qty 2

## 2019-05-20 MED ORDER — ONDANSETRON HCL 4 MG PO TABS: 4.0000 mg | ORAL_TABLET | Freq: Four times a day (QID) | ORAL | Status: DC | PRN

## 2019-05-20 MED ORDER — POLYETHYLENE GLYCOL 3350 17 G PO PACK: 17.0000 g | PACK | Freq: Every day | ORAL | Status: DC | PRN

## 2019-05-20 MED ORDER — PIPERACILLIN-TAZOBACTAM 3.375 G IVPB 30 MIN
3.3750 g | Freq: Once | INTRAVENOUS | Status: AC
  Administered 2019-05-20: 3.375 g via INTRAVENOUS
  Filled 2019-05-20: qty 50

## 2019-05-20 MED ORDER — NEBIVOLOL HCL 10 MG PO TABS
20.0000 mg | ORAL_TABLET | Freq: Every day | ORAL | Status: DC
  Administered 2019-05-22 – 2019-05-24 (×3): 20 mg via ORAL
  Filled 2019-05-20 (×4): qty 2

## 2019-05-20 MED ORDER — SODIUM CHLORIDE 0.9 % IV SOLN
12.5000 mg | Freq: Once | INTRAVENOUS | Status: AC
  Administered 2019-05-20: 12.5 mg via INTRAVENOUS
  Filled 2019-05-20: qty 0.5

## 2019-05-20 MED ORDER — SODIUM CHLORIDE 0.9% FLUSH
3.0000 mL | Freq: Two times a day (BID) | INTRAVENOUS | Status: DC
  Administered 2019-05-20 – 2019-05-24 (×4): 3 mL via INTRAVENOUS

## 2019-05-20 MED ORDER — INSULIN ASPART 100 UNIT/ML ~~LOC~~ SOLN
0.0000 [IU] | Freq: Three times a day (TID) | SUBCUTANEOUS | Status: DC
  Administered 2019-05-23: 12:00:00 2 [IU] via SUBCUTANEOUS
  Administered 2019-05-23: 1 [IU] via SUBCUTANEOUS
  Administered 2019-05-24: 09:00:00 2 [IU] via SUBCUTANEOUS

## 2019-05-20 MED ORDER — SODIUM CHLORIDE 0.9 % IV BOLUS
1000.0000 mL | Freq: Once | INTRAVENOUS | Status: AC
  Administered 2019-05-20: 1000 mL via INTRAVENOUS

## 2019-05-20 MED ORDER — SALINE SPRAY 0.65 % NA SOLN: 1.0000 | NASAL | Status: DC | PRN

## 2019-05-20 MED ORDER — ASPIRIN EC 325 MG PO TBEC
325.0000 mg | DELAYED_RELEASE_TABLET | Freq: Two times a day (BID) | ORAL | Status: DC
  Administered 2019-05-20 – 2019-05-24 (×7): 325 mg via ORAL
  Filled 2019-05-20 (×8): qty 1

## 2019-05-20 MED ORDER — TAMSULOSIN HCL 0.4 MG PO CAPS
0.4000 mg | ORAL_CAPSULE | Freq: Every day | ORAL | Status: DC
  Administered 2019-05-20 – 2019-05-23 (×4): 0.4 mg via ORAL
  Filled 2019-05-20 (×4): qty 1

## 2019-05-20 MED ORDER — DICLOFENAC SODIUM 1 % TD GEL: 2.0000 g | Freq: Four times a day (QID) | TRANSDERMAL | Status: DC | PRN

## 2019-05-20 MED ORDER — SENNA 8.6 MG PO TABS
1.0000 | ORAL_TABLET | Freq: Two times a day (BID) | ORAL | Status: DC
  Administered 2019-05-20 – 2019-05-23 (×6): 8.6 mg via ORAL
  Filled 2019-05-20 (×7): qty 1

## 2019-05-20 MED ORDER — SODIUM CHLORIDE 0.9 % IV SOLN
INTRAVENOUS | Status: AC
  Administered 2019-05-20 – 2019-05-22 (×5): via INTRAVENOUS

## 2019-05-20 MED ORDER — FLEET ENEMA 7-19 GM/118ML RE ENEM: 1.0000 | ENEMA | Freq: Once | RECTAL | Status: DC | PRN

## 2019-05-20 MED ORDER — PIPERACILLIN-TAZOBACTAM 3.375 G IVPB
3.3750 g | Freq: Three times a day (TID) | INTRAVENOUS | Status: DC
  Administered 2019-05-21 – 2019-05-24 (×10): 3.375 g via INTRAVENOUS
  Filled 2019-05-20 (×10): qty 50

## 2019-05-20 MED ORDER — SODIUM CHLORIDE 0.9 % IV SOLN
25.0000 mg | Freq: Four times a day (QID) | INTRAVENOUS | Status: DC | PRN
  Administered 2019-05-23 – 2019-05-24 (×2): 25 mg via INTRAVENOUS
  Filled 2019-05-20 (×3): qty 1

## 2019-05-20 MED ORDER — INSULIN GLARGINE 100 UNIT/ML ~~LOC~~ SOLN
25.0000 [IU] | Freq: Every day | SUBCUTANEOUS | Status: DC
  Administered 2019-05-20 – 2019-05-21 (×2): 25 [IU] via SUBCUTANEOUS
  Filled 2019-05-20 (×2): qty 0.25

## 2019-05-20 NOTE — Telephone Encounter (Signed)
Called and spoke with patients wife. Patient in hospital. Will reschedule appt if needed

## 2019-05-20 NOTE — ED Triage Notes (Addendum)
Pt BIB EMS from home. Pt has hx of pancreatic cancer and supposed to start treatments soon. Pt was getting ready to go to doctors appointment and fell. Family was unable to get him up. Pt has generalized weakness and loss of appetite. Pt did not hit head or have any injuries due to fall.   BP 110/60 94% RA CBG 184

## 2019-05-20 NOTE — Progress Notes (Signed)
Pharmacy Antibiotic Note  Damon Snyder. is a 85 y.o. male admitted on 05/20/2019 with intra-abdominal infection.  Pharmacy has been consulted for piperacillin/tazobactam dosing.  Today, 05/20/19  SCR 2.6, CrCl ~24 mL/min. SCr elevated from baseline  Afebrile  Plan:  Piperacillin/tazobactam 3.375 g IV q8h EI  Follow renal function  Height: 6\' 4"  (193 cm) Weight: 227 lb 1.2 oz (103 kg) IBW/kg (Calculated) : 86.8  Temp (24hrs), Avg:98.4 F (36.9 C), Min:98.4 F (36.9 C), Max:98.4 F (36.9 C)  Recent Labs  Lab 05/15/19 1156 05/20/19 1345  WBC 7.8 17.8*  CREATININE 1.61* 2.59*    Estimated Creatinine Clearance: 23.7 mL/min (A) (by C-G formula based on SCr of 2.59 mg/dL (H)).    No Known Allergies  Antimicrobials this admission: Piperacillin/tazobactam 8/25 >>   Dose adjustments this admission:  Microbiology results: 8/25 UCx: Sent  8/25 SARS-2: Sent  Thank you for allowing pharmacy to be a part of this patient's care.  Lenis Noon, PharmD 05/20/2019 6:21 PM

## 2019-05-20 NOTE — ED Provider Notes (Signed)
[facility] DEPT Provider Note   CSN: [id number] Arrival date & time: 05/20/19  1227     History   Chief Complaint Chief Complaint  Patient presents with  . Weakness    HPI Damon Snyder. is a 85 y.o. male.     85yo male with history of pancreatic cancer, colon and prostate cancer, schedule to begin treatment for his new diagnosis of pancreatic cancer, insulin dependent diabetes, not currently on chemo or radiation presents with complaint of dizziness and weakness. Patient states he felt dizzy today at home while resting/sitting. Patient checked his blood sugar, found to be 187, took his insulin per sliding scale and was walking to the kitchen with his walker when his legs began to feel week and he lowered himself to the ground. Patient denies any injuries from today's events. Patient is taking an unknown pain killer, unsure if this attributed to his symptoms today but reports having nausea and vomiting onset yesterday.      Past Medical History:  Diagnosis Date  . Anemia    h/o   . Arthritis    "left knee; shoulders" (09/14/2016)  . Colon cancer ([place])     tx : chemo)   . Difficulty in walking(719.7)   . Dyspnea    relative to pain in her leg  . Foot ulcer ([place])   . Gout   . High cholesterol   . History of blood transfusion    "that's when they found out about the colon cancer"  . Hypertension   . Lower GI bleed   . Prostate cancer [facility])     treated with radiation   . Type II diabetes mellitus [facility])     Patient Active Problem List   Diagnosis Date Noted  . Weakness generalized 05/20/2019  . Pancreatic mass 05/03/2019  . Elevated LFTs 05/03/2019  . Failure to thrive in adult 05/03/2019  . Total knee replacement status 09/14/2016  . Primary osteoarthritis of left knee   . Bilateral leg weakness 05/21/2015  . Atherosclerosis of native arteries of extremity with intermittent claudication ([place]) 11/13/2014  . Pain in lower limb  01/09/2014  . Chronic diastolic heart failure ([place]) 09/23/2013  . Near syncope 09/22/2013  . Essential hypertension, benign 09/22/2013  . Type 2 diabetes mellitus without complication, with long-term current use of insulin ([place]) 09/22/2013  . Pain in joint, ankle and foot 04/01/2013  . PVD (peripheral vascular disease) ([place]) 12/31/2012  . Onychomycosis due to dermatophyte 12/31/2012    Past Surgical History:  Procedure Laterality Date  . BILIARY BRUSHING  05/05/2019   Procedure: BILIARY BRUSHING;  Surgeon: Clarene Essex, MD;  Location: [facility] ENDOSCOPY;  Service: Endoscopy;;  . BILIARY STENT PLACEMENT  05/05/2019   Procedure: BILIARY STENT PLACEMENT;  Surgeon: Clarene Essex, MD;  Location: [place];  Service: Endoscopy;;  . CATARACT EXTRACTION W/ INTRAOCULAR LENS  IMPLANT, BILATERAL Bilateral   . COLECTOMY    . ENDOSCOPIC RETROGRADE CHOLANGIOPANCREATOGRAPHY (ERCP) WITH PROPOFOL N/A 05/05/2019   Procedure: ENDOSCOPIC RETROGRADE CHOLANGIOPANCREATOGRAPHY (ERCP) WITH PROPOFOL;  Surgeon: Clarene Essex, MD;  Location: [place];  Service: Endoscopy;  Laterality: N/A;  . JOINT REPLACEMENT    . PANCREATIC STENT PLACEMENT  05/05/2019   Procedure: PANCREATIC STENT PLACEMENT;  Surgeon: Clarene Essex, MD;  Location: [place];  Service: Endoscopy;;  . PROSTATE BIOPSY    . SPHINCTEROTOMY  05/05/2019   Procedure: SPHINCTEROTOMY;  Surgeon: Clarene Essex, MD;  Location: [place];  Service: Endoscopy;;  . TOTAL KNEE  ARTHROPLASTY Left 09/14/2016  . TOTAL KNEE ARTHROPLASTY Left 09/14/2016   Procedure: LEFT TOTAL KNEE ARTHROPLASTY;  Surgeon: Leandrew Koyanagi, MD;  Location: [place];  Service: Orthopedics;  Laterality: Left;        Home Medications    Prior to Admission medications   Medication Sig Start Date End Date Taking? Authorizing Provider  allopurinol (ZYLOPRIM) 100 MG tablet Take 200 mg by mouth daily as needed (gout).    Yes [provider]  aspirin EC 325 MG tablet Take 1 tablet (325 mg  total) by mouth 2 (two) times daily. 09/14/16  Yes Leandrew Koyanagi, MD  atorvastatin (LIPITOR) 20 MG tablet Take 20 mg by mouth daily.   Yes [provider]  diclofenac sodium (VOLTAREN) 1 % GEL Apply 4 g topically 4 (four) times daily as needed. Patient taking differently: Apply 2 g topically 4 (four) times daily as needed (for pain).  04/21/19  Yes Hilts, Legrand Como, MD  diphenhydrAMINE (BENADRYL) 25 mg capsule Take 25 mg by mouth every 6 (six) hours as needed for itching or allergies.   Yes [provider]  LANTUS SOLOSTAR 100 UNIT/ML Solostar Pen Inject 50 Units into the skin at bedtime.  11/17/14  Yes [provider]  Nebivolol HCl (BYSTOLIC) 20 MG TABS Take 20 mg by mouth daily.   Yes [provider]  NIFEdipine (ADALAT CC) 60 MG 24 hr tablet Take 1 tablet (60 mg total) by mouth daily. 05/08/19  Yes Vasireddy, Grier Mitts, MD  ondansetron (ZOFRAN) 8 MG tablet Take 1 tablet (8 mg total) by mouth every 8 (eight) hours as needed for nausea or vomiting. 05/19/19  Yes Ladell Pier, MD  oxyCODONE (OXY IR/ROXICODONE) 5 MG immediate release tablet Take 1 tablet (5 mg total) by mouth every 4 (four) hours as needed for severe pain. 05/19/19  Yes Ladell Pier, MD  sodium chloride (OCEAN) 0.65 % SOLN nasal spray Place 1 spray into both nostrils as needed for congestion.   Yes [provider]  tamsulosin (FLOMAX) 0.4 MG CAPS capsule Take 1 capsule (0.4 mg total) by mouth daily after supper. 05/07/19  Yes Monica Becton, MD  NOVOFINE 32G X 6 MM MISC  03/11/13   [provider]  [facility] VERIO test strip 1 STRIP TWICE A DAY FOR BLOOD SUGAR DX E11.22 FINGERSTICK 09/09/18   [provider]    Family History Family History  Problem Relation Age of Onset  . Hypertension Mother   . Cancer Sister        Ovarian  . Hypertension Father   . Heart disease Brother        After age 35  . Kidney disease Sister     Social History Social History    Tobacco Use  . Smoking status: Never Smoker  . Smokeless tobacco: Never Used  Substance Use Topics  . Alcohol use: No    Alcohol/week: 0.0 standard drinks  . Drug use: No     Allergies   Patient has no known allergies.   Review of Systems Review of Systems  Constitutional: Negative for chills and fever.  Respiratory: Negative for shortness of breath.   Cardiovascular: Negative for chest pain.  Gastrointestinal: Positive for abdominal pain, nausea and vomiting. Negative for constipation and diarrhea.  Genitourinary: Negative for difficulty urinating, dysuria and frequency.  Musculoskeletal: Negative for arthralgias and myalgias.  Skin: Negative for rash and wound.  Allergic/Immunologic: Positive for immunocompromised state.  Neurological: Positive for dizziness and weakness.  Hematological:  Does not bruise/bleed easily.  Psychiatric/Behavioral: Negative for confusion.  All other systems reviewed and are negative.    Physical Exam Updated Vital Signs BP 136/64   Pulse 74   Temp 98.4 F (36.9 C)   Resp 18   Ht 6\' 4"  (1.93 m)   Wt 103 kg   SpO2 97%   BMI 27.64 kg/m   Physical Exam Vitals signs and nursing note reviewed.  Constitutional:      General: He is not in acute distress.    Appearance: He is well-developed. He is not diaphoretic.  HENT:     Head: Normocephalic and atraumatic.  Cardiovascular:     Rate and Rhythm: Normal rate and regular rhythm.     Pulses: Normal pulses.     Heart sounds: Normal heart sounds.  Pulmonary:     Effort: Pulmonary effort is normal.     Breath sounds: Normal breath sounds.  Abdominal:     Palpations: Abdomen is soft.     Tenderness: There is abdominal tenderness in the left upper quadrant.     Comments: Mild tenderness LUQ  Musculoskeletal:     Right lower leg: No edema.     Left lower leg: No edema.  Skin:    General: Skin is warm and dry.     Findings: No erythema or rash.  Neurological:     Mental Status: He is  alert and oriented to person, place, and time.  Psychiatric:        Behavior: Behavior normal.      ED Treatments / Results  Labs (all labs ordered are listed, but only abnormal results are displayed) Labs Reviewed  COMPREHENSIVE METABOLIC PANEL - Abnormal; Notable for the following components:      Result Value   Chloride 97 (*)    Glucose, Bld 133 (*)    BUN 61 (*)    Creatinine, Ser 2.59 (*)    Calcium 8.6 (*)    Albumin 2.6 (*)    AST 60 (*)    ALT 55 (*)    Alkaline Phosphatase 182 (*)    Total Bilirubin 2.0 (*)    GFR calc non Af Amer 21 (*)    GFR calc Af Amer 24 (*)    Anion gap 16 (*)    All other components within normal limits  CBC WITH DIFFERENTIAL/PLATELET - Abnormal; Notable for the following components:   WBC 17.8 (*)    RBC 3.36 (*)    Hemoglobin 9.6 (*)    HCT 30.4 (*)    Neutro Abs 16.2 (*)    Lymphs Abs 0.5 (*)    Abs Immature Granulocytes 0.13 (*)    All other components within normal limits  SARS CORONAVIRUS 2 (TAT 6-12 HRS)  URINE CULTURE  URINALYSIS, ROUTINE W REFLEX MICROSCOPIC  SODIUM, URINE, RANDOM  CREATININE, URINE, RANDOM  POC OCCULT BLOOD, ED    EKG EKG Interpretation  Date/Time:  Tuesday May 20 2019 13:45:09 EDT Ventricular Rate:  79 PR Interval:    QRS Duration: 87 QT Interval:  385 QTC Calculation: 442 R Axis:   -36 Text Interpretation:  Sinus rhythm Probable left atrial enlargement Abnormal R-wave progression, early transition Left ventricular hypertrophy Anterior Q waves, possibly due to LVH Confirmed by Quintella Reichert [phone]) on 05/20/2019 1:51:26 PM   Radiology Ct Abdomen Pelvis Wo Contrast  Result Date: 05/20/2019 CLINICAL DATA:  Nausea and vomiting. Left upper quadrant abdominal pain. History of recently diagnosed pancreatic cancer. Generalized weakness. Loss of  appetite. EXAM: CT ABDOMEN AND PELVIS WITHOUT CONTRAST TECHNIQUE: Multidetector CT imaging of the abdomen and pelvis was performed following the standard  protocol without IV contrast. COMPARISON:  05/04/2019 MRI abdomen.  05/03/2019 CT abdomen/pelvis. FINDINGS: Lower chest: Left lower lobe 6 mm pulmonary nodule (series 6/image 35), increased from 4 mm on 05/03/2019 CT. Right middle lobe peripheral 4 mm pulmonary nodule (series 6/image 4), not previously imaged. Mild-to-moderate bibasilar atelectasis. Coronary atherosclerosis. Low cardiac blood pool density indicates anemia. Hepatobiliary: Normal liver size. Numerous (greater than 8) small hypodense liver masses scattered throughout the liver, mildly increased since 05/03/2019 CT. Representative posterior right upper lobe 1.6 cm hypodense mass (series 2/image 12), increased from 1.2 cm. Lateral segment left liver lobe 1.2 cm mass (series 2/image 22), increased from 0.8 cm. Tiny layering calcified gallstones within the mildly distended gallbladder. New mild diffuse gallbladder wall thickening and pericholecystic fat haziness. Scattered pneumobilia within the gallbladder and intrahepatic bile ducts. Well-positioned CBD stent terminating in the duodenal lumen. Pancreas: Known ill-defined pancreatic head mass measuring approximately 4.7 cm (series 2/image 34), not appreciably changed. Stable parenchymal atrophy and duct dilation throughout the pancreatic body and tail. Spleen: Normal size. No mass. Adrenals/Urinary Tract: Stable 1.0 cm right adrenal adenoma with density 1 HU. No discrete left adrenal nodules. No renal stones. No hydronephrosis. Previously described indeterminate renal cortical lesion in the upper left kidney on 05/04/2019 MRI is non contour deforming and occult on this noncontrast CT. Bilateral simple renal cysts, largest 4.3 cm in the lateral interpolar right kidney. Normal bladder. Stomach/Bowel: Normal non-distended stomach. Normal caliber small bowel with no small bowel wall thickening. Postsurgical changes from subtotal right hemicolectomy with ileocolic anastomosis in the right abdomen. No  large-bowel wall thickening, significant diverticulosis or significant pericolonic fat stranding. Vascular/Lymphatic: Atherosclerotic nonaneurysmal abdominal aorta. Stable mildly enlarged 1.1 cm porta hepatis node (series 2/image 29). No additional pathologically enlarged abdominopelvic nodes. Reproductive: Top-normal size prostate. Other: No pneumoperitoneum, ascites or focal fluid collection. Musculoskeletal: No aggressive appearing focal osseous lesions. Marked thoracolumbar spondylosis. IMPRESSION: 1. Known pancreatic head malignancy is unchanged. Well-positioned CBD stent with pneumobilia in the intrahepatic bile ducts indicating stent patency. 2. Cholelithiasis. New diffuse gallbladder wall thickening and pericholecystic fat stranding. Acute cholecystitis cannot be excluded. 3. Diffuse liver metastases have mildly increased in size. 4. Bilateral pulmonary nodules at the lung bases, mildly increased in size at the left lung base, worrisome for pulmonary metastases. 5. Stable mild porta hepatis adenopathy. 6. Stable right adrenal adenoma. 7. No evidence of bowel obstruction or acute bowel inflammation. 8.  Aortic Atherosclerosis (ICD10-I70.0). Electronically Signed   By: Ilona Sorrel M.D.   On: 05/20/2019 16:04    Procedures .Critical Care Performed by: Tacy Learn, PA-C Authorized by: Tacy Learn, PA-C   Critical care provider statement:    Critical care time (minutes):  45   Critical care was time spent personally by me on the following activities:  Discussions with consultants, evaluation of patient's response to treatment, examination of patient, ordering and performing treatments and interventions, ordering and review of laboratory studies, ordering and review of radiographic studies, pulse oximetry, re-evaluation of patient's condition, obtaining history from patient or surrogate and review of old charts   (including critical care time)  Medications Ordered in ED Medications  0.9 %   sodium chloride infusion (has no administration in time range)  sodium chloride 0.9 % bolus 1,000 mL (0 mLs Intravenous Stopped 05/20/19 1725)     Initial Impression / Assessment and Plan /  ED Course  I have reviewed the triage vital signs and the nursing notes.  Pertinent labs & imaging results that were available during my care of the patient were reviewed by me and considered in my medical decision making (see chart for details).  Clinical Course as of May 20 1731  Tue May 20, 2019  1550 Wife arrives at this hospital, reports emesis today looked like coffee. Patient was scheduled to see Dr. Nancy Fetter today with [facility], states patient is not eating anything lately.  Patient reports black stool x3 weeks.   [LM]  [id number] 85 year old male with complex medical history including prostate cancer, colon cancer, new diagnosis of pancreatic cancer.  Patient is not currently undergoing chemo or radiation.  Patient had an ERCP with CBD stent placed by GI, Dr. Watt Climes on August 10, brushings suspicious for malignancy.  Patient followed up with oncology yesterday, plan was to complete liver biopsy, if showed metastatic disease or not with guide whether or not patient wanted to pursue treatment.  Patient also reports speaking with palliative care yesterday. On exam, patient has mild tenderness in the left upper quadrant, Hemoccult reveals heme-negative soft brown/yellow stool.  Patient denies any pain or nausea at this time. Reviewed results with patient including CT scan concerning for infection around his gallbladder as well as concern for lesions in the lungs.  Discussed increased white blood cell count of 17.8 (previously 7.8 on August 20), hemoglobin 9.6 today (10.6 on May 15, 2011 0.1 on August 8); creatinine suggesting AKI 2.59 (1.61 on 8/20).  Case discussed with surgery, patient is not a surgical candidate at this time, recommends palliative care, will place patient on their list to consult if needed if  patient is admitted to the hospital, consider antibiotics and PERC drain if needed. Discussed with patient who would not like antibiotics or aggressive therapy at this time, would like palliative care team involvement.   [LM]  1700 Case discussed with palliative care who will consult, recommends hospitalist admission at this time due to time of day, likely unable to get patient into a hospice facility.  Patient came in for weakness and fall today, do not feel it is safe for him to go home, hospitalist paged for consult for admission.  Patient's wife was not present at the time of reviewing his results, I have attempted to reach her on her cell phone however she did not answer.   [LM]  [id number] Case discussed with hospitalist who will consult for admission.   [LM]    Clinical Course User Index [LM] Tacy Learn, PA-C      Final Clinical Impressions(s) / ED Diagnoses   Final diagnoses:  AKI (acute kidney injury) ([place])  Non-intractable vomiting with nausea, unspecified vomiting type  Left upper quadrant pain  Weakness  Biliary calculus of other site without obstruction  Anemia, unspecified type    ED Discharge Orders    None       Tacy Learn, PA-C 05/20/19 1732    Maudie Flakes, MD 05/22/19 [phone]

## 2019-05-20 NOTE — Plan of Care (Signed)
  Problem: Education: Goal: Knowledge of General Education information will improve Description: Including pain rating scale, medication(s)/side effects and non-pharmacologic comfort measures Outcome: Progressing   Problem: Health Behavior/Discharge Planning: Goal: Ability to manage health-related needs will improve Outcome: Progressing   Problem: Clinical Measurements: Goal: Ability to maintain clinical measurements within normal limits will improve Outcome: Progressing Goal: Will remain free from infection Outcome: Progressing Goal: Diagnostic test results will improve Outcome: Progressing Goal: Respiratory complications will improve Outcome: Progressing Goal: Cardiovascular complication will be avoided Outcome: Progressing   Problem: Coping: Goal: Level of anxiety will decrease Outcome: Progressing   Problem: Elimination: Goal: Will not experience complications related to urinary retention Outcome: Progressing   Problem: Pain Managment: Goal: General experience of comfort will improve Outcome: Progressing   Problem: Safety: Goal: Ability to remain free from injury will improve Outcome: Progressing   Problem: Skin Integrity: Goal: Risk for impaired skin integrity will decrease Outcome: Progressing   

## 2019-05-20 NOTE — ED Notes (Signed)
ED TO INPATIENT HANDOFF REPORT  Name/Age/Gender Damon Snyder. 85 y.o. male  Code Status Code Status History    Date Active Date Inactive Code Status Order ID Comments User Context   05/03/2019 2231 05/07/2019 1750 Full Code [id number]  Truett Mainland, DO Inpatient   09/14/2016 2004 09/16/2016 1611 Full Code [id number]  Leandrew Koyanagi, MD Inpatient   09/22/2013 1610 09/23/2013 2051 Full Code [id number]  Hosie Poisson, MD Inpatient   Advance Care Planning Activity      Home/SNF/Other Home  Chief Complaint weakness  Level of Care/Admitting Diagnosis ED Disposition    ED Disposition Condition [facility] Area: [facility] [id number]  Level of Care: Med-Surg [16]  Covid Evaluation: Asymptomatic Screening Protocol (No Symptoms)  Diagnosis: Weakness generalized [id number]  Admitting Physician: Eugenie Filler [[id number]]  Attending Physician: Eugenie Filler [[id number]]  PT Class (Do Not Modify): Observation [104]  PT Acc Code (Do Not Modify): Observation [10022]       Medical History Past Medical History:  Diagnosis Date  . Anemia    h/o   . Arthritis    "left knee; shoulders" (09/14/2016)  . Colon cancer ([place])     tx : chemo)   . Difficulty in walking(719.7)   . Dyspnea    relative to pain in her leg  . Foot ulcer ([place])   . Gout   . High cholesterol   . History of blood transfusion    "that's when they found out about the colon cancer"  . Hypertension   . Lower GI bleed   . Prostate cancer [facility])     treated with radiation   . Type II diabetes mellitus ([place])     Allergies No Known Allergies  IV Location/Drains/Wounds Patient Lines/Drains/Airways Status   Active Line/Drains/Airways    Name:   Placement date:   Placement time:   Site:   Days:   Peripheral IV 05/20/19 Left Antecubital   05/20/19    1344    Antecubital   less than 1   GI Stent 4 Fr.   05/05/19    1350    -   15   GI Stent 10 Fr.   05/05/19    1400    -   15    Incision (Closed) 09/14/16 Knee Left   09/14/16    1638     978          Labs/Imaging Results for orders placed or performed during the hospital encounter of 05/20/19 (from the past 48 hour(s))  Comprehensive metabolic panel     Status: Abnormal   Collection Time: 05/20/19  1:45 PM  Result Value Ref Range   Sodium 135 135 - 145 mmol/L   Potassium 3.6 3.5 - 5.1 mmol/L   Chloride 97 (L) 98 - 111 mmol/L   CO2 22 22 - 32 mmol/L   Glucose, Bld 133 (H) 70 - 99 mg/dL   BUN 61 (H) 8 - 23 mg/dL   Creatinine, Ser 2.59 (H) 0.61 - 1.24 mg/dL   Calcium 8.6 (L) 8.9 - 10.3 mg/dL   Total Protein 6.8 6.5 - 8.1 g/dL   Albumin 2.6 (L) 3.5 - 5.0 g/dL   AST 60 (H) 15 - 41 U/L   ALT 55 (H) 0 - 44 U/L   Alkaline Phosphatase 182 (H) 38 - 126 U/L   Total Bilirubin 2.0 (H) 0.3 - 1.2 mg/dL   GFR calc non Af Amer 21 (  L) >60 mL/min   GFR calc Af Amer 24 (L) >60 mL/min   Anion gap 16 (H) 5 - 15    Comment: Performed at [facility], [place] [address], [place], [place] [address]  CBC with Differential     Status: Abnormal   Collection Time: 05/20/19  1:45 PM  Result Value Ref Range   WBC 17.8 (H) 4.0 - 10.5 K/uL   RBC 3.36 (L) 4.22 - 5.81 MIL/uL   Hemoglobin 9.6 (L) 13.0 - 17.0 g/dL   HCT 30.4 (L) 39.0 - 52.0 %   MCV 90.5 80.0 - 100.0 fL   MCH 28.6 26.0 - 34.0 pg   MCHC 31.6 30.0 - 36.0 g/dL   RDW 14.9 11.5 - 15.5 %   Platelets 229 150 - 400 K/uL   nRBC 0.0 0.0 - 0.2 %   Neutrophils Relative % 91 %   Neutro Abs 16.2 (H) 1.7 - 7.7 K/uL   Lymphocytes Relative 3 %   Lymphs Abs 0.5 (L) 0.7 - 4.0 K/uL   Monocytes Relative 5 %   Monocytes Absolute 1.0 0.1 - 1.0 K/uL   Eosinophils Relative 0 %   Eosinophils Absolute 0.0 0.0 - 0.5 K/uL   Basophils Relative 0 %   Basophils Absolute 0.0 0.0 - 0.1 K/uL   Immature Granulocytes 1 %   Abs Immature Granulocytes 0.13 (H) 0.00 - 0.07 K/uL    Comment: Performed at [facility], [place] [address] [place], [place] [address]  POC  occult blood, ED Provider will collect     Status: None   Collection Time: 05/20/19  4:01 PM  Result Value Ref Range   Fecal Occult Bld NEGATIVE NEGATIVE   Ct Abdomen Pelvis Wo Contrast  Result Date: 05/20/2019 CLINICAL DATA:  Nausea and vomiting. Left upper quadrant abdominal pain. History of recently diagnosed pancreatic cancer. Generalized weakness. Loss of appetite. EXAM: CT ABDOMEN AND PELVIS WITHOUT CONTRAST TECHNIQUE: Multidetector CT imaging of the abdomen and pelvis was performed following the standard protocol without IV contrast. COMPARISON:  05/04/2019 MRI abdomen.  05/03/2019 CT abdomen/pelvis. FINDINGS: Lower chest: Left lower lobe 6 mm pulmonary nodule (series 6/image 35), increased from 4 mm on 05/03/2019 CT. Right middle lobe peripheral 4 mm pulmonary nodule (series 6/image 4), not previously imaged. Mild-to-moderate bibasilar atelectasis. Coronary atherosclerosis. Low cardiac blood pool density indicates anemia. Hepatobiliary: Normal liver size. Numerous (greater than 8) small hypodense liver masses scattered throughout the liver, mildly increased since 05/03/2019 CT. Representative posterior right upper lobe 1.6 cm hypodense mass (series 2/image 12), increased from 1.2 cm. Lateral segment left liver lobe 1.2 cm mass (series 2/image 22), increased from 0.8 cm. Tiny layering calcified gallstones within the mildly distended gallbladder. New mild diffuse gallbladder wall thickening and pericholecystic fat haziness. Scattered pneumobilia within the gallbladder and intrahepatic bile ducts. Well-positioned CBD stent terminating in the duodenal lumen. Pancreas: Known ill-defined pancreatic head mass measuring approximately 4.7 cm (series 2/image 34), not appreciably changed. Stable parenchymal atrophy and duct dilation throughout the pancreatic body and tail. Spleen: Normal size. No mass. Adrenals/Urinary Tract: Stable 1.0 cm right adrenal adenoma with density 1 HU. No discrete left adrenal  nodules. No renal stones. No hydronephrosis. Previously described indeterminate renal cortical lesion in the upper left kidney on 05/04/2019 MRI is non contour deforming and occult on this noncontrast CT. Bilateral simple renal cysts, largest 4.3 cm in the lateral interpolar right kidney. Normal bladder. Stomach/Bowel: Normal non-distended stomach. Normal caliber small bowel with no small  bowel wall thickening. Postsurgical changes from subtotal right hemicolectomy with ileocolic anastomosis in the right abdomen. No large-bowel wall thickening, significant diverticulosis or significant pericolonic fat stranding. Vascular/Lymphatic: Atherosclerotic nonaneurysmal abdominal aorta. Stable mildly enlarged 1.1 cm porta hepatis node (series 2/image 29). No additional pathologically enlarged abdominopelvic nodes. Reproductive: Top-normal size prostate. Other: No pneumoperitoneum, ascites or focal fluid collection. Musculoskeletal: No aggressive appearing focal osseous lesions. Marked thoracolumbar spondylosis. IMPRESSION: 1. Known pancreatic head malignancy is unchanged. Well-positioned CBD stent with pneumobilia in the intrahepatic bile ducts indicating stent patency. 2. Cholelithiasis. New diffuse gallbladder wall thickening and pericholecystic fat stranding. Acute cholecystitis cannot be excluded. 3. Diffuse liver metastases have mildly increased in size. 4. Bilateral pulmonary nodules at the lung bases, mildly increased in size at the left lung base, worrisome for pulmonary metastases. 5. Stable mild porta hepatis adenopathy. 6. Stable right adrenal adenoma. 7. No evidence of bowel obstruction or acute bowel inflammation. 8.  Aortic Atherosclerosis (ICD10-I70.0). Electronically Signed   By: Ilona Sorrel M.D.   On: 05/20/2019 16:04   Dg Chest Port 1 View  Result Date: 05/20/2019 CLINICAL DATA:  Weakness, fall EXAM: PORTABLE CHEST 1 VIEW COMPARISON:  Radiograph May 03, 2019 FINDINGS: Bandlike areas of scarring  and/or subsegmental atelectasis seen in both bases, right greater than left. Central vascular crowding. No consolidation, features of edema, pneumothorax, or effusion. Pulmonary vascularity is normally distributed. The aorta is calcified. The remaining cardiomediastinal contours are unremarkable. No acute osseous or soft tissue abnormality. Degenerative changes are present in the imaged spine and shoulders. IMPRESSION: Basilar atelectasis and/or scarring. Otherwise no acute cardiopulmonary or traumatic abnormality Electronically Signed   By: Lovena Le M.D.   On: 05/20/2019 18:22    Pending Labs Unresulted Labs (From admission, onward)    Start     Ordered   05/20/19 1731  Sodium, urine, random  Once,   STAT     05/20/19 1730   05/20/19 1731  Creatinine, urine, random  Once,   STAT     05/20/19 1730   05/20/19 1730  Culture, Urine  Once,   STAT     05/20/19 1729   05/20/19 1540  SARS CORONAVIRUS 2 (TAT 6-12 HRS) Nasal Swab Aptima Multi Swab  (Asymptomatic/Tier 2 Patients Labs)  Once,   STAT    Question Answer Comment  Is this test for diagnosis or screening Screening   Symptomatic for COVID-19 as defined by CDC No   Hospitalized for COVID-19 No   Admitted to ICU for COVID-19 No   Previously tested for COVID-19 Yes   Resident in a congregate (group) care setting No   Employed in healthcare setting No      05/20/19 1539   05/20/19 1301  Urinalysis, Routine w reflex microscopic  ONCE - STAT,   STAT     05/20/19 1300   Signed and Held  Magnesium  Add-on,   R     Signed and Held   Signed and Held  Comprehensive metabolic panel  Tomorrow morning,   R     Signed and Held   Signed and Held  CBC WITH DIFFERENTIAL  Tomorrow morning,   R     Signed and Held   Signed and Held  Vitamin B12  (Anemia Panel (PNL))  Tomorrow morning,   R     Signed and Held   Signed and Held  Folate  (Anemia Panel (PNL))  Tomorrow morning,   R     Signed and Held   Signed  and Held  Iron and TIBC  (Anemia Panel  (PNL))  Tomorrow morning,   R     Signed and Held   Signed and Held  Ferritin  (Anemia Panel (PNL))  Tomorrow morning,   R     Signed and Held   Signed and Held  Reticulocytes  (Anemia Panel (PNL))  Tomorrow morning,   R     Signed and Held          Vitals/Pain Today's Vitals   05/20/19 1730 05/20/19 1800 05/20/19 1944 05/20/19 1950  BP: 121/61 132/61 (!) 131/56   Pulse: 75 77 70   Resp: (!) 25 18 19    Temp:      SpO2: 91% 99% 98%   Weight:      Height:      PainSc:    0-No pain    Isolation Precautions Droplet and Contact precautions  Medications Medications  0.9 %  sodium chloride infusion (has no administration in time range)  chlorproMAZINE (THORAZINE) 12.5 mg in sodium chloride 0.9 % 25 mL IVPB (has no administration in time range)  piperacillin-tazobactam (ZOSYN) IVPB 3.375 g (has no administration in time range)  piperacillin-tazobactam (ZOSYN) IVPB 3.375 g (has no administration in time range)  insulin glargine (LANTUS) injection 25 Units (has no administration in time range)  insulin aspart (novoLOG) injection 0-9 Units (has no administration in time range)  chlorproMAZINE (THORAZINE) 25 mg in sodium chloride 0.9 % 25 mL IVPB (has no administration in time range)  sodium chloride 0.9 % bolus 1,000 mL (0 mLs Intravenous Stopped 05/20/19 1725)    Mobility walks

## 2019-05-20 NOTE — Telephone Encounter (Signed)
Spoke with Mrs. Hall Busing. Mr. Banghart does not want chemotherapy. Explained that the [place] won't be necessary if he decided not to pursue tx. Briefly discussed [place] and Palliative care services. Damon Snyder will call back with his final decision after they see Dr. Nancy Fetter at [place] this morning.

## 2019-05-20 NOTE — H&P (Signed)
History and Physical    Damon Snyder. JZP:[id number] DOB: 05-Jul-1928 DOA: 05/20/2019  PCP: Mayra Neer, MD  Patient coming from: Home  I have personally briefly reviewed patient's old medical records in [place]  Chief Complaint: Generalized weakness  HPI: Damon Snyder. is a 85 y.o. male with medical history significant of pancreatic cancer with probable metastatic disease to the liver and lungs, recently diagnosed per ERCP on 05/05/2019 status post common bile duct stent placement, hypertension, prior history of colon cancer status post chemotherapy, history of prostate cancer status post radiation treatment, insulin-dependent diabetes mellitus type 2, gout, hyperlipidemia, anemia, arthritis presented to the ED with sudden onset generalized weakness and dizziness.  Patient stated on day of admission he woke up feeling okay was getting ready to go to his doctor's visit with his oncologist when he felt dizzy and felt his legs gave out and subsequently went down and was unable to get up.  Patient denies any syncopal episode.  Patient does endorse some subjective fevers and chills, episode of nausea and emesis which was brown the night prior to admission after he took his medication, some shortness of breath, constipation, lightheadedness.  Patient denies any diarrhea, no melena, no hematemesis, no hematochezia.  Patient stated he noted he had dark brown stool.  Patient denies any chest pain.  Patient does endorse upper abdominal pain.  Patient subsequently presented to the ED.  ED Course: Patient seen in the ED, FOBT done was negative.  COVID-19 test was ordered and pending.  Comprehensive metabolic profile with a chloride of 97, glucose of 133, BUN of 61, creatinine of 2.59, alk phosphatase of 182, AST of 60, ALT of 55, bilirubin of 2.  CBC with a white count of 17.8, hemoglobin of 9.6, platelet count of 229.  ANC of 16.2.  Glucose of 133.  Urinalysis pending.  Chest x-ray pending.  CT  abdomen and pelvis done showed known pancreatic head malignancy unchanged, well-positioned CBD stent with pneumobilia in the intrahepatic bile ducts indicating stent patency, cholelithiasis, new diffuse gallbladder wall thickening and pericholecystic fat stranding.  Acute cholecystitis cannot be excluded.  Diffuse liver metastases have mildly increased in size.  Bilateral pulmonary nodules at the lung bases, mildly increased in size at the left lung base, worrisome for pulmonary mets.  Stable right adrenal adenoma.  Stable mild porta hepatis adenopathy.  No evidence of bowel obstruction or acute bowel inflammation.  EDPA stated spoke with patient and patient did not want antibiotics and was leaning more towards comfort measures.  EDPA stated she spoke with general surgery who had recommended that if patient was admitted under the hospitalist service.we will follow along.  Review of Systems: As per HPI otherwise 10 point review of systems negative.   Past Medical History:  Diagnosis Date   Anemia    h/o    Arthritis    "left knee; shoulders" (09/14/2016)   Colon cancer ([place])     tx : chemo)    Difficulty in walking(719.7)    Dyspnea    relative to pain in her leg   Foot ulcer ([place])    Gout    High cholesterol    History of blood transfusion    "that's when they found out about the colon cancer"   Hypertension    Lower GI bleed    Prostate cancer ([place])     treated with radiation    Type II diabetes mellitus [facility])     Past Surgical  History:  Procedure Laterality Date   BILIARY BRUSHING  05/05/2019   Procedure: BILIARY BRUSHING;  Surgeon: Clarene Essex, MD;  Location: [facility] ENDOSCOPY;  Service: Endoscopy;;   BILIARY STENT PLACEMENT  05/05/2019   Procedure: BILIARY STENT PLACEMENT;  Surgeon: Clarene Essex, MD;  Location: [place];  Service: Endoscopy;;   CATARACT EXTRACTION W/ INTRAOCULAR LENS  IMPLANT, BILATERAL Bilateral    COLECTOMY     ENDOSCOPIC RETROGRADE  CHOLANGIOPANCREATOGRAPHY (ERCP) WITH PROPOFOL N/A 05/05/2019   Procedure: ENDOSCOPIC RETROGRADE CHOLANGIOPANCREATOGRAPHY (ERCP) WITH PROPOFOL;  Surgeon: Clarene Essex, MD;  Location: [place];  Service: Endoscopy;  Laterality: N/A;   JOINT REPLACEMENT     PANCREATIC STENT PLACEMENT  05/05/2019   Procedure: PANCREATIC STENT PLACEMENT;  Surgeon: Clarene Essex, MD;  Location: [place];  Service: Endoscopy;;   PROSTATE BIOPSY     SPHINCTEROTOMY  05/05/2019   Procedure: Joan Mayans;  Surgeon: Clarene Essex, MD;  Location: [place];  Service: Endoscopy;;   TOTAL KNEE ARTHROPLASTY Left 09/14/2016   TOTAL KNEE ARTHROPLASTY Left 09/14/2016   Procedure: LEFT TOTAL KNEE ARTHROPLASTY;  Surgeon: Leandrew Koyanagi, MD;  Location: [place];  Service: Orthopedics;  Laterality: Left;     reports that he has never smoked. He has never used smokeless tobacco. He reports that he does not drink alcohol or use drugs.  No Known Allergies  Family History  Problem Relation Age of Onset   Hypertension Mother    Cancer Sister        Ovarian   Hypertension Father    Heart disease Brother        After age 49   Kidney disease Sister    Mother deceased age 89 from old age.  Father deceased in his 88s from high blood pressure per patient.  Prior to Admission medications   Medication Sig Start Date End Date Taking? Authorizing Provider  allopurinol (ZYLOPRIM) 100 MG tablet Take 200 mg by mouth daily as needed (gout).    Yes [provider]  aspirin EC 325 MG tablet Take 1 tablet (325 mg total) by mouth 2 (two) times daily. 09/14/16  Yes Leandrew Koyanagi, MD  atorvastatin (LIPITOR) 20 MG tablet Take 20 mg by mouth daily.   Yes [provider]  diclofenac sodium (VOLTAREN) 1 % GEL Apply 4 g topically 4 (four) times daily as needed. Patient taking differently: Apply 2 g topically 4 (four) times daily as needed (for pain).  04/21/19  Yes Hilts, Legrand Como, MD  diphenhydrAMINE (BENADRYL) 25 mg  capsule Take 25 mg by mouth every 6 (six) hours as needed for itching or allergies.   Yes [provider]  LANTUS SOLOSTAR 100 UNIT/ML Solostar Pen Inject 50 Units into the skin at bedtime.  11/17/14  Yes [provider]  Nebivolol HCl (BYSTOLIC) 20 MG TABS Take 20 mg by mouth daily.   Yes [provider]  NIFEdipine (ADALAT CC) 60 MG 24 hr tablet Take 1 tablet (60 mg total) by mouth daily. 05/08/19  Yes Vasireddy, Grier Mitts, MD  ondansetron (ZOFRAN) 8 MG tablet Take 1 tablet (8 mg total) by mouth every 8 (eight) hours as needed for nausea or vomiting. 05/19/19  Yes Ladell Pier, MD  oxyCODONE (OXY IR/ROXICODONE) 5 MG immediate release tablet Take 1 tablet (5 mg total) by mouth every 4 (four) hours as needed for severe pain. 05/19/19  Yes Ladell Pier, MD  sodium chloride (OCEAN) 0.65 % SOLN nasal spray Place 1 spray into both nostrils as needed for congestion.  Yes [provider]  tamsulosin (FLOMAX) 0.4 MG CAPS capsule Take 1 capsule (0.4 mg total) by mouth daily after supper. 05/07/19  Yes Monica Becton, MD  NOVOFINE 32G X 6 MM MISC  03/11/13   [provider]  [facility] VERIO test strip 1 STRIP TWICE A DAY FOR BLOOD SUGAR DX E11.22 FINGERSTICK 09/09/18   [provider]    Physical Exam: Vitals:   05/20/19 1630 05/20/19 1700 05/20/19 1730 05/20/19 1800  BP: 129/63 136/64 121/61 132/61  Pulse: 75 74 75 77  Resp: 16 18 (!) 25 18  Temp:      SpO2: 98% 97% 91% 99%  Weight:      Height:        Constitutional: NAD, calm, comfortable Vitals:   05/20/19 1630 05/20/19 1700 05/20/19 1730 05/20/19 1800  BP: 129/63 136/64 121/61 132/61  Pulse: 75 74 75 77  Resp: 16 18 (!) 25 18  Temp:      SpO2: 98% 97% 91% 99%  Weight:      Height:       Eyes: PERRL, lids and conjunctivae normal ENMT: Mucous membranes are dry. Posterior pharynx clear of any exudate or lesions.Normal dentition.  Neck: normal, supple, no masses, no  thyromegaly Respiratory: Some bibasilar coarse breath sounds otherwise clear.  No wheezing, no crackles, no rhonchi.  Speaking in full sentences.  No use of accessory muscles of respiration.  Cardiovascular: Regular rate and rhythm, no murmurs / rubs / gallops. No extremity edema. 2+ pedal pulses. No carotid bruits.  Abdomen: Soft, some tenderness to palpation in the epigastrium and right upper quadrant.  Positive bowel sounds.  No rebound.  No guarding.  Musculoskeletal: no clubbing / cyanosis. No joint deformity upper and lower extremities. Good ROM, no contractures. Normal muscle tone.  Skin: no rashes, lesions, ulcers. No induration Neurologic: CN 2-12 grossly intact. Sensation intact, DTR normal. Strength 5/5 in all 4.  Psychiatric: Normal judgment and insight. Alert and oriented x 3. Normal mood.   Labs on Admission: I have personally reviewed following labs and imaging studies  CBC: Recent Labs  Lab 05/15/19 1156 05/20/19 1345  WBC 7.8 17.8*  NEUTROABS 6.1 16.2*  HGB 10.6* 9.6*  HCT 34.8* 30.4*  MCV 91.1 90.5  PLT 181 338   Basic Metabolic Panel: Recent Labs  Lab 05/15/19 1156 05/20/19 1345  NA 139 135  K 3.3* 3.6  CL 105 97*  CO2 21* 22  GLUCOSE 150* 133*  BUN 20 61*  CREATININE 1.61* 2.59*  CALCIUM 8.8* 8.6*   GFR: Estimated Creatinine Clearance: 23.7 mL/min (A) (by C-G formula based on SCr of 2.59 mg/dL (H)). Liver Function Tests: Recent Labs  Lab 05/15/19 1156 05/20/19 1345  AST 48* 60*  ALT 76* 55*  ALKPHOS 334* 182*  BILITOT 2.1* 2.0*  PROT 6.5 6.8  ALBUMIN 3.0* 2.6*   No results for input(s): LIPASE, AMYLASE in the last 168 hours. No results for input(s): AMMONIA in the last 168 hours. Coagulation Profile: No results for input(s): INR, PROTIME in the last 168 hours. Cardiac Enzymes: No results for input(s): CKTOTAL, CKMB, CKMBINDEX, TROPONINI in the last 168 hours. BNP (last 3 results) No results for input(s): PROBNP in the last 8760  hours. HbA1C: No results for input(s): HGBA1C in the last 72 hours. CBG: Recent Labs  Lab 05/15/19 0855 05/15/19 1037 05/15/19 1451  GLUCAP 115* 133* 186*   Lipid Profile: No results for input(s): CHOL, HDL, LDLCALC, TRIG, CHOLHDL, LDLDIRECT in the last  72 hours. Thyroid Function Tests: No results for input(s): TSH, T4TOTAL, FREET4, T3FREE, THYROIDAB in the last 72 hours. Anemia Panel: No results for input(s): VITAMINB12, FOLATE, FERRITIN, TIBC, IRON, RETICCTPCT in the last 72 hours. Urine analysis:    Component Value Date/Time   COLORURINE YELLOW 05/15/2019 1332   APPEARANCEUR CLEAR 05/15/2019 1332   LABSPEC 1.012 05/15/2019 1332   PHURINE 5.0 05/15/2019 1332   GLUCOSEU NEGATIVE 05/15/2019 1332   HGBUR NEGATIVE 05/15/2019 1332   BILIRUBINUR NEGATIVE 05/15/2019 1332   KETONESUR NEGATIVE 05/15/2019 1332   PROTEINUR NEGATIVE 05/15/2019 1332   UROBILINOGEN 0.2 09/22/2013 1519   NITRITE NEGATIVE 05/15/2019 1332   LEUKOCYTESUR NEGATIVE 05/15/2019 1332    Radiological Exams on Admission: Ct Abdomen Pelvis Wo Contrast  Result Date: 05/20/2019 CLINICAL DATA:  Nausea and vomiting. Left upper quadrant abdominal pain. History of recently diagnosed pancreatic cancer. Generalized weakness. Loss of appetite. EXAM: CT ABDOMEN AND PELVIS WITHOUT CONTRAST TECHNIQUE: Multidetector CT imaging of the abdomen and pelvis was performed following the standard protocol without IV contrast. COMPARISON:  05/04/2019 MRI abdomen.  05/03/2019 CT abdomen/pelvis. FINDINGS: Lower chest: Left lower lobe 6 mm pulmonary nodule (series 6/image 35), increased from 4 mm on 05/03/2019 CT. Right middle lobe peripheral 4 mm pulmonary nodule (series 6/image 4), not previously imaged. Mild-to-moderate bibasilar atelectasis. Coronary atherosclerosis. Low cardiac blood pool density indicates anemia. Hepatobiliary: Normal liver size. Numerous (greater than 8) small hypodense liver masses scattered throughout the liver, mildly  increased since 05/03/2019 CT. Representative posterior right upper lobe 1.6 cm hypodense mass (series 2/image 12), increased from 1.2 cm. Lateral segment left liver lobe 1.2 cm mass (series 2/image 22), increased from 0.8 cm. Tiny layering calcified gallstones within the mildly distended gallbladder. New mild diffuse gallbladder wall thickening and pericholecystic fat haziness. Scattered pneumobilia within the gallbladder and intrahepatic bile ducts. Well-positioned CBD stent terminating in the duodenal lumen. Pancreas: Known ill-defined pancreatic head mass measuring approximately 4.7 cm (series 2/image 34), not appreciably changed. Stable parenchymal atrophy and duct dilation throughout the pancreatic body and tail. Spleen: Normal size. No mass. Adrenals/Urinary Tract: Stable 1.0 cm right adrenal adenoma with density 1 HU. No discrete left adrenal nodules. No renal stones. No hydronephrosis. Previously described indeterminate renal cortical lesion in the upper left kidney on 05/04/2019 MRI is non contour deforming and occult on this noncontrast CT. Bilateral simple renal cysts, largest 4.3 cm in the lateral interpolar right kidney. Normal bladder. Stomach/Bowel: Normal non-distended stomach. Normal caliber small bowel with no small bowel wall thickening. Postsurgical changes from subtotal right hemicolectomy with ileocolic anastomosis in the right abdomen. No large-bowel wall thickening, significant diverticulosis or significant pericolonic fat stranding. Vascular/Lymphatic: Atherosclerotic nonaneurysmal abdominal aorta. Stable mildly enlarged 1.1 cm porta hepatis node (series 2/image 29). No additional pathologically enlarged abdominopelvic nodes. Reproductive: Top-normal size prostate. Other: No pneumoperitoneum, ascites or focal fluid collection. Musculoskeletal: No aggressive appearing focal osseous lesions. Marked thoracolumbar spondylosis. IMPRESSION: 1. Known pancreatic head malignancy is unchanged.  Well-positioned CBD stent with pneumobilia in the intrahepatic bile ducts indicating stent patency. 2. Cholelithiasis. New diffuse gallbladder wall thickening and pericholecystic fat stranding. Acute cholecystitis cannot be excluded. 3. Diffuse liver metastases have mildly increased in size. 4. Bilateral pulmonary nodules at the lung bases, mildly increased in size at the left lung base, worrisome for pulmonary metastases. 5. Stable mild porta hepatis adenopathy. 6. Stable right adrenal adenoma. 7. No evidence of bowel obstruction or acute bowel inflammation. 8.  Aortic Atherosclerosis (ICD10-I70.0). Electronically Signed   By: Rinaldo Ratel  Poff M.D.   On: 05/20/2019 16:04   Dg Chest Port 1 View  Result Date: 05/20/2019 CLINICAL DATA:  Weakness, fall EXAM: PORTABLE CHEST 1 VIEW COMPARISON:  Radiograph May 03, 2019 FINDINGS: Bandlike areas of scarring and/or subsegmental atelectasis seen in both bases, right greater than left. Central vascular crowding. No consolidation, features of edema, pneumothorax, or effusion. Pulmonary vascularity is normally distributed. The aorta is calcified. The remaining cardiomediastinal contours are unremarkable. No acute osseous or soft tissue abnormality. Degenerative changes are present in the imaged spine and shoulders. IMPRESSION: Basilar atelectasis and/or scarring. Otherwise no acute cardiopulmonary or traumatic abnormality Electronically Signed   By: Lovena Le M.D.   On: 05/20/2019 18:22    EKG: Independently reviewed.  Normal sinus rhythm with LVH.  No ischemic changes noted.  Assessment/Plan Principal Problem:   Weakness generalized Active Problems:   Pancreatic cancer (HCC)   PVD (peripheral vascular disease) (HCC)   Essential hypertension, benign   Type 2 diabetes mellitus without complication, with long-term current use of insulin (HCC)   Chronic diastolic heart failure (HCC)   Pancreatic mass   Failure to thrive in adult   Acute kidney injury  superimposed on CKD (HCC)   Dehydration   Leukocytosis   Anemia   Transaminitis   1 generalized weakness Likely secondary to dehydration/volume depletion versus secondary to acute infectious etiology in the setting of probable metastatic pancreatic cancer.  Patient does endorse decreased oral intake.  Patient noted to have symptoms of dizziness and lightheadedness and stated his legs gave out.  Lab work with elevated BUN and creatinine.  Will check a fractional excretion of sodium.  UA with cultures and sensitivities pending.  Placed on gentle hydration.  PT/OT.  Follow.  2.  Probable acute cholecystitis Patient presented with subjective fevers and chills, epigastric and right upper quadrant tenderness to palpation.  Patient noted to have a leukocytosis with a white count of 17.8 with a left shift.  CT abdomen and pelvis with cholelithiasis and new diffuse gallbladder wall thickening and pericholecystic fat stranding.  Will check a right upper quadrant ultrasound.  Placed empirically on IV Zosyn.  Consult with general surgery for further evaluation and recommendations.  3.  Acute kidney injury on chronic kidney disease stage III Patient with a baseline creatinine from 1.5-1.8.  Likely secondary to a prerenal azotemia secondary to poor oral intake and generalized weakness.  Check a fractional excretion of sodium.  UA with cultures and sensitivities pending.  Place on gentle hydration.  Follow.  4.  Dehydration IV fluids.  5.  Leukocytosis Secondary to problem #2.  Urinalysis and urine cultures pending.  Chest x-ray pending.  Placed empirically on IV Zosyn.  Follow.  6.  Transaminitis Likely secondary to metastatic pancreatic cancer.  Follow for now.  7.  Recently diagnosed pancreatic cancer with probable mets to the liver and lungs. Patient recently seen by oncology, Dr. Benay Spice.  Patient and family deciding as to whether to do a liver biopsy and consideration for possible chemotherapy  versus hospice.  Oncology has been notified via epic of patient's admission.  Consulted palliative care for goals of care.  Follow.  8.  Insulin-dependent diabetes mellitus type 2 Last hemoglobin A1c was 6.0 on 05/03/2019.  Placed on half home dose Lantus of 25 units daily.  Sliding scale insulin.  Follow CBGs.  9.  Hypertension Hold nifedipine.  Continue beta-blocker.  IV fluids.  Follow.  Could likely resume nifedipine in the next 24 to 48 hours.  10.  Hyperlipidemia Hold statin for now in light of probable liver mets.  11.  Chronic diastolic heart failure/peripheral vascular disease Currently euvolemic and stable.  We will hold statin.  Continue aspirin, beta-blocker.  Hold nifedipine for now.  Follow.  12.  Normocytic anemia Likely secondary to anemia of chronic disease.  Patient with no overt bleeding.  FOBT was negative.  Check an anemia panel.  Follow H&H.  Transfusion threshold hemoglobin less than 7.  DVT prophylaxis: Heparin Code Status: DNR Family Communication: Updated patient.  No family at bedside. Disposition Plan: To be determined Consults called: General surgery: Dr. Ninfa Linden patient will be seen tomorrow 05/21/2019. Admission status: Place in observation.   Irine Seal MD [facility]  If 7PM-7AM, please contact night-coverage [email or web address]  05/20/2019, 6:35 PM

## 2019-05-20 NOTE — Progress Notes (Signed)
Late Entry: Was present during initial consult with Dr. Benay Spice on 8/24.  Information from [facility] provided along with contact information for tx team members. Spoke with Mrs. Hall Busing after appointment to answer questions. She was out shopping and asked that I call her back on 8/25.

## 2019-05-20 NOTE — Telephone Encounter (Signed)
Damon Snyder called to say that Damon Snyder had fallen and she wasn't able to help him up. He was also spitting up brown emesis during the night. Pt being transported to [facility] ED by Ambulance for evaluation.

## 2019-05-21 ENCOUNTER — Telehealth: Payer: Self-pay

## 2019-05-21 DIAGNOSIS — Z923 Personal history of irradiation: Secondary | ICD-10-CM | POA: Diagnosis not present

## 2019-05-21 DIAGNOSIS — N183 Chronic kidney disease, stage 3 (moderate): Secondary | ICD-10-CM | POA: Diagnosis present

## 2019-05-21 DIAGNOSIS — Z7189 Other specified counseling: Secondary | ICD-10-CM

## 2019-05-21 DIAGNOSIS — R5381 Other malaise: Secondary | ICD-10-CM | POA: Diagnosis not present

## 2019-05-21 DIAGNOSIS — K819 Cholecystitis, unspecified: Secondary | ICD-10-CM | POA: Diagnosis not present

## 2019-05-21 DIAGNOSIS — M109 Gout, unspecified: Secondary | ICD-10-CM | POA: Diagnosis present

## 2019-05-21 DIAGNOSIS — N189 Chronic kidney disease, unspecified: Secondary | ICD-10-CM | POA: Diagnosis not present

## 2019-05-21 DIAGNOSIS — K8020 Calculus of gallbladder without cholecystitis without obstruction: Secondary | ICD-10-CM | POA: Diagnosis not present

## 2019-05-21 DIAGNOSIS — I130 Hypertensive heart and chronic kidney disease with heart failure and stage 1 through stage 4 chronic kidney disease, or unspecified chronic kidney disease: Secondary | ICD-10-CM | POA: Diagnosis present

## 2019-05-21 DIAGNOSIS — C787 Secondary malignant neoplasm of liver and intrahepatic bile duct: Secondary | ICD-10-CM | POA: Diagnosis present

## 2019-05-21 DIAGNOSIS — C250 Malignant neoplasm of head of pancreas: Secondary | ICD-10-CM | POA: Diagnosis present

## 2019-05-21 DIAGNOSIS — I10 Essential (primary) hypertension: Secondary | ICD-10-CM | POA: Diagnosis not present

## 2019-05-21 DIAGNOSIS — E1122 Type 2 diabetes mellitus with diabetic chronic kidney disease: Secondary | ICD-10-CM | POA: Diagnosis present

## 2019-05-21 DIAGNOSIS — K8080 Other cholelithiasis without obstruction: Secondary | ICD-10-CM | POA: Diagnosis not present

## 2019-05-21 DIAGNOSIS — E860 Dehydration: Secondary | ICD-10-CM | POA: Diagnosis present

## 2019-05-21 DIAGNOSIS — M2550 Pain in unspecified joint: Secondary | ICD-10-CM | POA: Diagnosis not present

## 2019-05-21 DIAGNOSIS — I5032 Chronic diastolic (congestive) heart failure: Secondary | ICD-10-CM | POA: Diagnosis present

## 2019-05-21 DIAGNOSIS — Z7982 Long term (current) use of aspirin: Secondary | ICD-10-CM | POA: Diagnosis not present

## 2019-05-21 DIAGNOSIS — Z85038 Personal history of other malignant neoplasm of large intestine: Secondary | ICD-10-CM | POA: Diagnosis not present

## 2019-05-21 DIAGNOSIS — D649 Anemia, unspecified: Secondary | ICD-10-CM | POA: Diagnosis not present

## 2019-05-21 DIAGNOSIS — Z8041 Family history of malignant neoplasm of ovary: Secondary | ICD-10-CM | POA: Diagnosis not present

## 2019-05-21 DIAGNOSIS — R1013 Epigastric pain: Secondary | ICD-10-CM | POA: Diagnosis present

## 2019-05-21 DIAGNOSIS — K810 Acute cholecystitis: Secondary | ICD-10-CM | POA: Diagnosis present

## 2019-05-21 DIAGNOSIS — Z515 Encounter for palliative care: Secondary | ICD-10-CM

## 2019-05-21 DIAGNOSIS — E7800 Pure hypercholesterolemia, unspecified: Secondary | ICD-10-CM | POA: Diagnosis present

## 2019-05-21 DIAGNOSIS — R531 Weakness: Secondary | ICD-10-CM

## 2019-05-21 DIAGNOSIS — Z96652 Presence of left artificial knee joint: Secondary | ICD-10-CM | POA: Diagnosis present

## 2019-05-21 DIAGNOSIS — Z841 Family history of disorders of kidney and ureter: Secondary | ICD-10-CM | POA: Diagnosis not present

## 2019-05-21 DIAGNOSIS — Z8546 Personal history of malignant neoplasm of prostate: Secondary | ICD-10-CM | POA: Diagnosis not present

## 2019-05-21 DIAGNOSIS — Z8249 Family history of ischemic heart disease and other diseases of the circulatory system: Secondary | ICD-10-CM | POA: Diagnosis not present

## 2019-05-21 DIAGNOSIS — Z7401 Bed confinement status: Secondary | ICD-10-CM | POA: Diagnosis not present

## 2019-05-21 DIAGNOSIS — N179 Acute kidney failure, unspecified: Secondary | ICD-10-CM | POA: Diagnosis present

## 2019-05-21 DIAGNOSIS — R627 Adult failure to thrive: Secondary | ICD-10-CM | POA: Diagnosis present

## 2019-05-21 DIAGNOSIS — E785 Hyperlipidemia, unspecified: Secondary | ICD-10-CM | POA: Diagnosis present

## 2019-05-21 DIAGNOSIS — Z66 Do not resuscitate: Secondary | ICD-10-CM | POA: Diagnosis present

## 2019-05-21 DIAGNOSIS — Z20828 Contact with and (suspected) exposure to other viral communicable diseases: Secondary | ICD-10-CM | POA: Diagnosis present

## 2019-05-21 DIAGNOSIS — E1151 Type 2 diabetes mellitus with diabetic peripheral angiopathy without gangrene: Secondary | ICD-10-CM | POA: Diagnosis present

## 2019-05-21 LAB — GLUCOSE, CAPILLARY
Glucose-Capillary: 49 mg/dL — ABNORMAL LOW (ref 70–99)
Glucose-Capillary: 129 mg/dL — ABNORMAL HIGH (ref 70–99)
Glucose-Capillary: 123 mg/dL — ABNORMAL HIGH (ref 70–99)
Glucose-Capillary: 117 mg/dL — ABNORMAL HIGH (ref 70–99)
Glucose-Capillary: 110 mg/dL — ABNORMAL HIGH (ref 70–99)

## 2019-05-21 LAB — COMPREHENSIVE METABOLIC PANEL
ALT: 57 U/L — ABNORMAL HIGH (ref 0–44)
AST: 70 U/L — ABNORMAL HIGH (ref 15–41)
Albumin: 2.5 g/dL — ABNORMAL LOW (ref 3.5–5.0)
Alkaline Phosphatase: 162 U/L — ABNORMAL HIGH (ref 38–126)
Anion gap: 11 (ref 5–15)
BUN: 57 mg/dL — ABNORMAL HIGH (ref 8–23)
CO2: 22 mmol/L (ref 22–32)
Calcium: 8.4 mg/dL — ABNORMAL LOW (ref 8.9–10.3)
Chloride: 104 mmol/L (ref 98–111)
Creatinine, Ser: 2.03 mg/dL — ABNORMAL HIGH (ref 0.61–1.24)
GFR calc Af Amer: 33 mL/min — ABNORMAL LOW (ref >60)
GFR calc non Af Amer: 28 mL/min — ABNORMAL LOW (ref >60)
Glucose, Bld: 69 mg/dL — ABNORMAL LOW (ref 70–99)
Potassium: 4 mmol/L (ref 3.5–5.1)
Sodium: 137 mmol/L (ref 135–145)
Total Bilirubin: 1.6 mg/dL — ABNORMAL HIGH (ref 0.3–1.2)
Total Protein: 6.4 g/dL — ABNORMAL LOW (ref 6.5–8.1)

## 2019-05-21 LAB — SODIUM, URINE, RANDOM: Sodium, Ur: <10 mmol/L

## 2019-05-21 LAB — URINALYSIS, ROUTINE W REFLEX MICROSCOPIC
Bilirubin Urine: NEGATIVE
Glucose, UA: NEGATIVE mg/dL
Hgb urine dipstick: NEGATIVE
Ketones, ur: NEGATIVE mg/dL
Leukocytes,Ua: NEGATIVE
Nitrite: NEGATIVE
Protein, ur: NEGATIVE mg/dL
Specific Gravity, Urine: 1.015 (ref 1.005–1.030)
pH: 5 (ref 5.0–8.0)

## 2019-05-21 LAB — IRON AND TIBC
Iron: 15 ug/dL — ABNORMAL LOW (ref 45–182)
Saturation Ratios: 12 % — ABNORMAL LOW (ref 17.9–39.5)
TIBC: 130 ug/dL — ABNORMAL LOW (ref 250–450)
UIBC: 115 ug/dL

## 2019-05-21 LAB — RETICULOCYTES
Immature Retic Fract: 7.8 % (ref 2.3–15.9)
RBC.: 3.16 MIL/uL — ABNORMAL LOW (ref 4.22–5.81)
Retic Count, Absolute: 30.7 10*3/uL (ref 19.0–186.0)
Retic Ct Pct: 1 % (ref 0.4–3.1)

## 2019-05-21 LAB — CBC WITH DIFFERENTIAL/PLATELET
Abs Immature Granulocytes: 0.11 10*3/uL — ABNORMAL HIGH (ref 0.00–0.07)
Basophils Absolute: 0 10*3/uL (ref 0.0–0.1)
Basophils Relative: 0 %
Eosinophils Absolute: 0.1 10*3/uL (ref 0.0–0.5)
Eosinophils Relative: 1 %
HCT: 29.2 % — ABNORMAL LOW (ref 39.0–52.0)
Hemoglobin: 9.1 g/dL — ABNORMAL LOW (ref 13.0–17.0)
Immature Granulocytes: 1 %
Lymphocytes Relative: 5 %
Lymphs Abs: 0.8 10*3/uL (ref 0.7–4.0)
MCH: 28.8 pg (ref 26.0–34.0)
MCHC: 31.2 g/dL (ref 30.0–36.0)
MCV: 92.4 fL (ref 80.0–100.0)
Monocytes Absolute: 1.2 10*3/uL — ABNORMAL HIGH (ref 0.1–1.0)
Monocytes Relative: 8 %
Neutro Abs: 13.2 10*3/uL — ABNORMAL HIGH (ref 1.7–7.7)
Neutrophils Relative %: 85 %
Platelets: 223 10*3/uL (ref 150–400)
RBC: 3.16 MIL/uL — ABNORMAL LOW (ref 4.22–5.81)
RDW: 15 % (ref 11.5–15.5)
WBC: 15.4 10*3/uL — ABNORMAL HIGH (ref 4.0–10.5)
nRBC: 0 % (ref 0.0–0.2)

## 2019-05-21 LAB — FOLATE: Folate: 8.4 ng/mL (ref >5.9)

## 2019-05-21 LAB — VITAMIN B12: Vitamin B-12: 772 pg/mL (ref 180–914)

## 2019-05-21 LAB — CREATININE, URINE, RANDOM: Creatinine, Urine: 109.26 mg/dL

## 2019-05-21 LAB — FERRITIN: Ferritin: 1118 ng/mL — ABNORMAL HIGH (ref 24–336)

## 2019-05-21 MED ORDER — PROMETHAZINE HCL 25 MG/ML IJ SOLN
12.5000 mg | Freq: Four times a day (QID) | INTRAMUSCULAR | Status: DC | PRN
  Administered 2019-05-21: 11:00:00 12.5 mg via INTRAVENOUS
  Filled 2019-05-21: qty 1

## 2019-05-21 MED ORDER — DEXTROSE 50 % IV SOLN
INTRAVENOUS | Status: AC
  Filled 2019-05-21: qty 50

## 2019-05-21 MED ORDER — DEXTROSE 50 % IV SOLN
25.0000 g | INTRAVENOUS | Status: AC
  Administered 2019-05-21: 25 g via INTRAVENOUS

## 2019-05-21 NOTE — Progress Notes (Signed)
PT Cancellation Note  Patient Details Name: Damon Snyder. MRN: [id number] DOB: 26-Jul-1928   Cancelled Treatment:    Reason Eval/Treat Not Completed: Other (comment) Spouse awaiting Dr. Domingo Cocking who arrived shortly after therapist.  Will check back as schedule permits.   [name],KATHrine E 05/21/2019, 2:06 PM Carmelia Bake, PT, DPT Acute Rehabilitation Services Office: [phone] Pager: [phone]

## 2019-05-21 NOTE — Progress Notes (Signed)
PROGRESS NOTE    [name].  EH:[id number] DOB: 03/13/1928 DOA: 05/20/2019 PCP: Mayra Neer, MD    Brief Narrative:  85 y.o. male with medical history significant of pancreatic cancer with probable metastatic disease to the liver and lungs, recently diagnosed per ERCP on 05/05/2019 status post common bile duct stent placement, hypertension, prior history of colon cancer status post chemotherapy, history of prostate cancer status post radiation treatment, insulin-dependent diabetes mellitus type 2, gout, hyperlipidemia, anemia, arthritis presented to the ED with sudden onset generalized weakness and dizziness.  Patient stated on day of admission he woke up feeling okay was getting ready to go to his doctor's visit with his oncologist when he felt dizzy and felt his legs gave out and subsequently went down and was unable to get up.  Patient denies any syncopal episode.  Patient does endorse some subjective fevers and chills, episode of nausea and emesis which was brown the night prior to admission after he took his medication, some shortness of breath, constipation, lightheadedness.  Patient denies any diarrhea, no melena, no hematemesis, no hematochezia.  Patient stated he noted he had dark brown stool.  Patient denies any chest pain.  Patient does endorse upper abdominal pain.  Patient subsequently presented to the ED.  Assessment & Plan:   Principal Problem:   Weakness generalized Active Problems:   PVD (peripheral vascular disease) (HCC)   Essential hypertension, benign   Type 2 diabetes mellitus without complication, with long-term current use of insulin (HCC)   Chronic diastolic heart failure (HCC)   Pancreatic mass   Failure to thrive in adult   Pancreatic cancer (HCC)   Acute kidney injury superimposed on CKD (HCC)   Dehydration   Leukocytosis   Anemia   Transaminitis   Acute cholecystitis  1 generalized weakness -Suspect secondary to dehydration in addition to acute  cholecystitis -Cont on hydration as tolerated -PT/OTconsulted -repeat bmet in AM  2.  Probable acute cholecystitis -not septic -LFT's and CT abd consistent with acute cholecystitis -Cont on IV zosyn.  -General Surgery following. Pt has requested least invasive means of tx. General Surgery recommending trial of abx and if no improvement, then possible HIDA  3.  Acute kidney injury on chronic kidney disease stage III -Patient with a baseline creatinine from 1.5-1.8.   -Cr improving with hydration -repeat bmet in AM  4.  Dehydration -Continued on IVF hydration  5.  Leukocytosis -Secondary to cholecystitis -UA unremarkable -Cont on zosyn. WBC improving -Repeat CBC in AM  6.  Transaminitis -Likely secondary to metastatic pancreatic cancer.  -Repeat LFT in AM  7.  Recently diagnosed pancreatic cancer with probable mets to the liver and lungs. -Followed by Dr. Benay Spice -Pt with wishes for no chemo or tx, goal is hospice -Palliative Care consulted  8.  Insulin-dependent diabetes mellitus type 2 -Last hemoglobin A1c was 6.0 on 05/03/2019.   -Placed on half home dose Lantus of 25 units daily.   -continue Sliding scale insulin.  Follow CBGs.  9.  Hypertension -Holding nifedipine.   -Continue beta-blocker.   10.  Hyperlipidemia -Cont to hold statin -Repeat LFT in AM  11.  Chronic diastolic heart failure/peripheral vascular disease -Currently euvolemic and stable.   -Continue aspirin, beta-blocker.  - Hold nifedipine for now.  Follow.  12.  Normocytic anemia -Likely secondary to anemia of chronic disease.   -No evidence of bleeding.  FOBT was negative.   -repeat cbc in AM.  DVT prophylaxis: heparin subq Code Status: DNR  Family Communication: Pt in room, family not at bedside Disposition Plan: Uncertain at this time  Consultants:   General Surgery  Palliative Care  Oncology  Procedures:     Antimicrobials: Anti-infectives (From admission, onward)     Start     Dose/Rate Route Frequency Ordered Stop   05/21/19 0600  piperacillin-tazobactam (ZOSYN) IVPB 3.375 g     3.375 g 12.5 mL/hr over 240 Minutes Intravenous Every 8 hours 05/20/19 1820     05/20/19 1830  piperacillin-tazobactam (ZOSYN) IVPB 3.375 g     3.375 g 100 mL/hr over 30 Minutes Intravenous  Once 05/20/19 1819 05/20/19 2344       Subjective: Nauseated this AM, improved with anti-emetic  Objective: Vitals:   05/20/19 2024 05/21/19 0425 05/21/19 0508 05/21/19 1341  BP: (!) 137/57  (!) 117/52 (!) 135/56  Pulse: 71  69 69  Resp: 20  18 20   Temp: 98.1 F (36.7 C)  98.6 F (37 C) 97.9 F (36.6 C)  TempSrc: Oral  Oral Oral  SpO2: 98%  94% 100%  Weight:  102.8 kg    Height:        Intake/Output Summary (Last 24 hours) at 05/21/2019 1447 Last data filed at 05/21/2019 1030 Gross per 24 hour  Intake 2165.74 ml  Output 650 ml  Net 1515.74 ml   Filed Weights   05/20/19 1238 05/20/19 2015 05/21/19 0425  Weight: 103 kg 102.9 kg 102.8 kg    Examination:  General exam: Appears calm and comfortable  Respiratory system: Clear to auscultation. Respiratory effort normal. Cardiovascular system: S1 & S2 heard, RRR Gastrointestinal system: Abdomen is nondistended, soft and nontender. No organomegaly or masses felt. Normal bowel sounds heard. Central nervous system: Alert and oriented. No focal neurological deficits. Extremities: Symmetric 5 x 5 power. Skin: No rashes, lesions Psychiatry: Judgement and insight appear normal. Mood & affect appropriate.   Data Reviewed: I have personally reviewed following labs and imaging studies  CBC: Recent Labs  Lab 05/15/19 1156 05/20/19 1345 05/21/19 0435  WBC 7.8 17.8* 15.4*  NEUTROABS 6.1 16.2* 13.2*  HGB 10.6* 9.6* 9.1*  HCT 34.8* 30.4* 29.2*  MCV 91.1 90.5 92.4  PLT 181 229 [id number]   Basic Metabolic Panel: Recent Labs  Lab 05/15/19 1156 05/20/19 1345 05/20/19 2015 05/21/19 0435  NA 139 135  --  137  K 3.3* 3.6  --   4.0  CL 105 97*  --  104  CO2 21* 22  --  22  GLUCOSE 150* 133*  --  69*  BUN 20 61*  --  57*  CREATININE 1.61* 2.59*  --  2.03*  CALCIUM 8.8* 8.6*  --  8.4*  MG  --   --  2.3  --    GFR: Estimated Creatinine Clearance: 30.3 mL/min (A) (by C-G formula based on SCr of 2.03 mg/dL (H)). Liver Function Tests: Recent Labs  Lab 05/15/19 1156 05/20/19 1345 05/21/19 0435  AST 48* 60* 70*  ALT 76* 55* 57*  ALKPHOS 334* 182* 162*  BILITOT 2.1* 2.0* 1.6*  PROT 6.5 6.8 6.4*  ALBUMIN 3.0* 2.6* 2.5*   No results for input(s): LIPASE, AMYLASE in the last 168 hours. No results for input(s): AMMONIA in the last 168 hours. Coagulation Profile: No results for input(s): INR, PROTIME in the last 168 hours. Cardiac Enzymes: No results for input(s): CKTOTAL, CKMB, CKMBINDEX, TROPONINI in the last 168 hours. BNP (last 3 results) No results for input(s): PROBNP in the last 8760 hours. HbA1C: No  results for input(s): HGBA1C in the last 72 hours. CBG: Recent Labs  Lab 05/15/19 1451 05/20/19 2026 05/21/19 0742 05/21/19 0912 05/21/19 1210  GLUCAP 186* 112* 49* 129* 123*   Lipid Profile: No results for input(s): CHOL, HDL, LDLCALC, TRIG, CHOLHDL, LDLDIRECT in the last 72 hours. Thyroid Function Tests: No results for input(s): TSH, T4TOTAL, FREET4, T3FREE, THYROIDAB in the last 72 hours. Anemia Panel: Recent Labs    05/21/19 0435  VITAMINB12 772  FOLATE 8.4  FERRITIN 1,118*  TIBC 130*  IRON 15*  RETICCTPCT 1.0   Sepsis Labs: No results for input(s): PROCALCITON, LATICACIDVEN in the last 168 hours.  Recent Results (from the past 240 hour(s))  SARS CORONAVIRUS 2 (TAT 6-12 HRS) Nasal Swab Aptima Multi Swab     Status: None   Collection Time: 05/20/19  3:40 PM   Specimen: Aptima Multi Swab; Nasal Swab  Result Value Ref Range Status   SARS Coronavirus 2 NEGATIVE NEGATIVE Final    Comment: (NOTE) SARS-CoV-2 target nucleic acids are NOT DETECTED. The SARS-CoV-2 RNA is generally  detectable in upper and lower respiratory specimens during the acute phase of infection. Negative results do not preclude SARS-CoV-2 infection, do not rule out co-infections with other pathogens, and should not be used as the sole basis for treatment or other patient management decisions. Negative results must be combined with clinical observations, patient history, and epidemiological information. The expected result is Negative. Fact Sheet for Patients: SugarRoll.be Fact Sheet for Healthcare Providers: https://www.woods-mathews.com/ This test is not yet approved or cleared by the [place] FDA and  has been authorized for detection and/or diagnosis of SARS-CoV-2 by FDA under an Emergency Use Authorization (EUA). This EUA will remain  in effect (meaning this test can be used) for the duration of the COVID-19 declaration under Section 56 4(b)(1) of the Act, 21 U.S.C. section 360bbb-3(b)(1), unless the authorization is terminated or revoked sooner. Performed at [facility], [place] [address], [place], [place] [address]      Radiology Studies: Ct Abdomen Pelvis Wo Contrast  Result Date: 05/20/2019 CLINICAL DATA:  Nausea and vomiting. Left upper quadrant abdominal pain. History of recently diagnosed pancreatic cancer. Generalized weakness. Loss of appetite. EXAM: CT ABDOMEN AND PELVIS WITHOUT CONTRAST TECHNIQUE: Multidetector CT imaging of the abdomen and pelvis was performed following the standard protocol without IV contrast. COMPARISON:  05/04/2019 MRI abdomen.  05/03/2019 CT abdomen/pelvis. FINDINGS: Lower chest: Left lower lobe 6 mm pulmonary nodule (series 6/image 35), increased from 4 mm on 05/03/2019 CT. Right middle lobe peripheral 4 mm pulmonary nodule (series 6/image 4), not previously imaged. Mild-to-moderate bibasilar atelectasis. Coronary atherosclerosis. Low cardiac blood pool density indicates anemia. Hepatobiliary: Normal liver  size. Numerous (greater than 8) small hypodense liver masses scattered throughout the liver, mildly increased since 05/03/2019 CT. Representative posterior right upper lobe 1.6 cm hypodense mass (series 2/image 12), increased from 1.2 cm. Lateral segment left liver lobe 1.2 cm mass (series 2/image 22), increased from 0.8 cm. Tiny layering calcified gallstones within the mildly distended gallbladder. New mild diffuse gallbladder wall thickening and pericholecystic fat haziness. Scattered pneumobilia within the gallbladder and intrahepatic bile ducts. Well-positioned CBD stent terminating in the duodenal lumen. Pancreas: Known ill-defined pancreatic head mass measuring approximately 4.7 cm (series 2/image 34), not appreciably changed. Stable parenchymal atrophy and duct dilation throughout the pancreatic body and tail. Spleen: Normal size. No mass. Adrenals/Urinary Tract: Stable 1.0 cm right adrenal adenoma with density 1 HU. No discrete left adrenal nodules. No renal stones.  No hydronephrosis. Previously described indeterminate renal cortical lesion in the upper left kidney on 05/04/2019 MRI is non contour deforming and occult on this noncontrast CT. Bilateral simple renal cysts, largest 4.3 cm in the lateral interpolar right kidney. Normal bladder. Stomach/Bowel: Normal non-distended stomach. Normal caliber small bowel with no small bowel wall thickening. Postsurgical changes from subtotal right hemicolectomy with ileocolic anastomosis in the right abdomen. No large-bowel wall thickening, significant diverticulosis or significant pericolonic fat stranding. Vascular/Lymphatic: Atherosclerotic nonaneurysmal abdominal aorta. Stable mildly enlarged 1.1 cm porta hepatis node (series 2/image 29). No additional pathologically enlarged abdominopelvic nodes. Reproductive: Top-normal size prostate. Other: No pneumoperitoneum, ascites or focal fluid collection. Musculoskeletal: No aggressive appearing focal osseous lesions.  Marked thoracolumbar spondylosis. IMPRESSION: 1. Known pancreatic head malignancy is unchanged. Well-positioned CBD stent with pneumobilia in the intrahepatic bile ducts indicating stent patency. 2. Cholelithiasis. New diffuse gallbladder wall thickening and pericholecystic fat stranding. Acute cholecystitis cannot be excluded. 3. Diffuse liver metastases have mildly increased in size. 4. Bilateral pulmonary nodules at the lung bases, mildly increased in size at the left lung base, worrisome for pulmonary metastases. 5. Stable mild porta hepatis adenopathy. 6. Stable right adrenal adenoma. 7. No evidence of bowel obstruction or acute bowel inflammation. 8.  Aortic Atherosclerosis (ICD10-I70.0). Electronically Signed   By: Ilona Sorrel M.D.   On: 05/20/2019 16:04   Dg Chest Port 1 View  Result Date: 05/20/2019 CLINICAL DATA:  Weakness, fall EXAM: PORTABLE CHEST 1 VIEW COMPARISON:  Radiograph May 03, 2019 FINDINGS: Bandlike areas of scarring and/or subsegmental atelectasis seen in both bases, right greater than left. Central vascular crowding. No consolidation, features of edema, pneumothorax, or effusion. Pulmonary vascularity is normally distributed. The aorta is calcified. The remaining cardiomediastinal contours are unremarkable. No acute osseous or soft tissue abnormality. Degenerative changes are present in the imaged spine and shoulders. IMPRESSION: Basilar atelectasis and/or scarring. Otherwise no acute cardiopulmonary or traumatic abnormality Electronically Signed   By: Lovena Le M.D.   On: 05/20/2019 18:22   US Abdomen Limited Ruq  Result Date: 05/20/2019 CLINICAL DATA:  Cholecystitis EXAM: ULTRASOUND ABDOMEN LIMITED RIGHT UPPER QUADRANT COMPARISON:  Same day CT FINDINGS: Gallbladder: There is a mixture of biliary sludge and echogenic shadowing gallstones with several hyperechogenic, dirty shadowing foci of intraluminal gas. There is a thickened echogenic gallbladder wall measuring up to 8.4 mm.  Small volume of pericholecystic fluid is present.Sonographic Percell Miller sign is reportedly negative. Common bile duct: Diameter: 5.2 mm, not dilated. Biliary stent present in the proximal common bile duct. Liver: Difficult visualization of entire liver parenchyma due to overlying bowel gas. Several echogenic shadowing foci within the liver are likely reflective the pneumatosis seen on prior CT. Portal vein is patent on color Doppler imaging with normal direction of blood flow towards the liver. Other: None. IMPRESSION: Biliary stent in place with mild intrahepatic biliary ductal dilatation and pneumatosis. Cholelithiasis and biliary sludge in with foci of intraluminal gas. Wall thickening and pericholecystic fluid are suggestive of acute cholecystitis though sonographic Percell Miller sign is reportedly negative. Could correlate for history of analgesia which may limit the diagnostic utility of the sonographic Murphy sign. Electronically Signed   By: Lovena Le M.D.   On: 05/20/2019 20:08    Scheduled Meds:  aspirin EC  325 mg Oral BID   heparin  5,000 Units Subcutaneous Q8H   insulin aspart  0-9 Units Subcutaneous TID WC   insulin glargine  25 Units Subcutaneous QHS   nebivolol  20 mg Oral Daily  senna  1 tablet Oral BID   sodium chloride flush  3 mL Intravenous Q12H   tamsulosin  0.4 mg Oral QPC supper   Continuous Infusions:  sodium chloride     sodium chloride 100 mL/hr at 05/21/19 1220   chlorproMAZINE (THORAZINE) IV     piperacillin-tazobactam (ZOSYN)  IV 12.5 mL/hr at 05/21/19 0600     LOS: 0 days   Marylu Lund, MD [facility] Pager On Amion  If 7PM-7AM, please contact night-coverage 05/21/2019, 2:47 PM

## 2019-05-21 NOTE — Progress Notes (Addendum)
HEMATOLOGY-ONCOLOGY PROGRESS NOTE  SUBJECTIVE: [name] presented to the emergency room with generalized weakness.  He had an episode of dizziness without a syncopal episode on the day of admission.  He had some mild abdominal discomfort as well.  In the emergency room, he was found to be dehydrated with an elevated BUN and creatinine.  He was also noted to have elevated LFTs and total bilirubin as well as an elevated white blood cell count of 17.8.  A CT of the abdomen pelvis was performed which showed the known pancreatic head malignancy which was unchanged, well-positioned CBD stent with pneumobilia in the intrahepatic bile duct indicating stent patency, cholelithiasis, new diffuse gallbladder wall thickening and pericholecystic fat stranding.  Acute cholecystitis could not be excluded.  Additionally, his diffuse liver metastases had mildly increased in size.  The patient was admitted for IV fluids and IV antibiotics.  A general surgery consult is pending.  When seen today, the patient reports that he feels little bit better.  He was able to eat his breakfast without any increase in abdominal pain.  He denies nausea and vomiting.  Denies diarrhea.  Still having mild right upper quadrant abdominal discomfort.  REVIEW OF SYSTEMS:   Constitutional: Denies fevers and chillst Respiratory: Denies cough, dyspnea or wheezes Cardiovascular: Denies palpitation, chest discomfort Gastrointestinal: Reports mild abdominal discomfort.  Denies nausea vomiting.  Denies diarrhea and constipation. Neurological:Denies numbness, tingling or new weaknesses Behavioral/Psych: Mood is stable, no new changes  Extremities: No lower extremity edema MSK: Reports generalized weakness All other systems were reviewed with the patient and are negative.  I have reviewed the past medical history, past surgical history, social history and family history with the patient and they are unchanged from previous note.   PHYSICAL  EXAMINATION:  Vitals:   05/20/19 2024 05/21/19 0508  BP: (!) 137/57 (!) 117/52  Pulse: 71 69  Resp: 20 18  Temp: 98.1 F (36.7 C) 98.6 F (37 C)  SpO2: 98% 94%   Filed Weights   05/20/19 1238 05/20/19 2015 05/21/19 0425  Weight: 227 lb 1.2 oz (103 kg) 226 lb 13.7 oz (102.9 kg) 226 lb 10.1 oz (102.8 kg)    Intake/Output from previous day: 08/25 0701 - 08/26 0700 In: 1805.7 [I.V.:723.1; IV Piggyback:1082.6] Out: 400 [Urine:400]  GENERAL:alert, no distress and comfortable SKIN: skin color, texture, turgor are normal, no rashes or significant lesions EYES: normal, Conjunctiva are pink and non-injected, sclera clear OROPHARYNX:no exudate, no erythema and lips, buccal mucosa, and tongue normal  NECK: supple, thyroid normal size, non-tender, without nodularity LYMPH:  no palpable lymphadenopathy in the cervical, axillary or inguinal LUNGS: clear to auscultation and percussion with normal breathing effort HEART: regular rate & rhythm and no murmurs and no lower extremity edema ABDOMEN: Positive bowel sounds, soft, mild distention with palpation to the right upper quadrant Musculoskeletal:no cyanosis of digits and no clubbing  NEURO: alert & oriented x 3 with fluent speech, no focal motor/sensory deficits  LABORATORY DATA:  I have reviewed the data as listed CMP Latest Ref Rng & Units 05/21/2019 05/20/2019 05/15/2019  Glucose 70 - 99 mg/dL 69(L) 133(H) 150(H)  BUN 8 - 23 mg/dL 57(H) 61(H) 20  Creatinine 0.61 - 1.24 mg/dL 2.03(H) 2.59(H) 1.61(H)  Sodium 135 - 145 mmol/L 137 135 139  Potassium 3.5 - 5.1 mmol/L 4.0 3.6 3.3(L)  Chloride 98 - 111 mmol/L 104 97(L) 105  CO2 22 - 32 mmol/L 22 22 21(L)  Calcium 8.9 - 10.3 mg/dL 8.4(L) 8.6(L) 8.8(L)  Total Protein 6.5 - 8.1 g/dL 6.4(L) 6.8 6.5  Total Bilirubin 0.3 - 1.2 mg/dL 1.6(H) 2.0(H) 2.1(H)  Alkaline Phos 38 - 126 U/L 162(H) 182(H) 334(H)  AST 15 - 41 U/L 70(H) 60(H) 48(H)  ALT 0 - 44 U/L 57(H) 55(H) 76(H)    Lab Results   Component Value Date   WBC 15.4 (H) 05/21/2019   HGB 9.1 (L) 05/21/2019   HCT 29.2 (L) 05/21/2019   MCV 92.4 05/21/2019   PLT 223 05/21/2019   NEUTROABS 13.2 (H) 05/21/2019    Ct Abdomen Pelvis Wo Contrast  Result Date: 05/20/2019 CLINICAL DATA:  Nausea and vomiting. Left upper quadrant abdominal pain. History of recently diagnosed pancreatic cancer. Generalized weakness. Loss of appetite. EXAM: CT ABDOMEN AND PELVIS WITHOUT CONTRAST TECHNIQUE: Multidetector CT imaging of the abdomen and pelvis was performed following the standard protocol without IV contrast. COMPARISON:  05/04/2019 MRI abdomen.  05/03/2019 CT abdomen/pelvis. FINDINGS: Lower chest: Left lower lobe 6 mm pulmonary nodule (series 6/image 35), increased from 4 mm on 05/03/2019 CT. Right middle lobe peripheral 4 mm pulmonary nodule (series 6/image 4), not previously imaged. Mild-to-moderate bibasilar atelectasis. Coronary atherosclerosis. Low cardiac blood pool density indicates anemia. Hepatobiliary: Normal liver size. Numerous (greater than 8) small hypodense liver masses scattered throughout the liver, mildly increased since 05/03/2019 CT. Representative posterior right upper lobe 1.6 cm hypodense mass (series 2/image 12), increased from 1.2 cm. Lateral segment left liver lobe 1.2 cm mass (series 2/image 22), increased from 0.8 cm. Tiny layering calcified gallstones within the mildly distended gallbladder. New mild diffuse gallbladder wall thickening and pericholecystic fat haziness. Scattered pneumobilia within the gallbladder and intrahepatic bile ducts. Well-positioned CBD stent terminating in the duodenal lumen. Pancreas: Known ill-defined pancreatic head mass measuring approximately 4.7 cm (series 2/image 34), not appreciably changed. Stable parenchymal atrophy and duct dilation throughout the pancreatic body and tail. Spleen: Normal size. No mass. Adrenals/Urinary Tract: Stable 1.0 cm right adrenal adenoma with density 1 HU. No  discrete left adrenal nodules. No renal stones. No hydronephrosis. Previously described indeterminate renal cortical lesion in the upper left kidney on 05/04/2019 MRI is non contour deforming and occult on this noncontrast CT. Bilateral simple renal cysts, largest 4.3 cm in the lateral interpolar right kidney. Normal bladder. Stomach/Bowel: Normal non-distended stomach. Normal caliber small bowel with no small bowel wall thickening. Postsurgical changes from subtotal right hemicolectomy with ileocolic anastomosis in the right abdomen. No large-bowel wall thickening, significant diverticulosis or significant pericolonic fat stranding. Vascular/Lymphatic: Atherosclerotic nonaneurysmal abdominal aorta. Stable mildly enlarged 1.1 cm porta hepatis node (series 2/image 29). No additional pathologically enlarged abdominopelvic nodes. Reproductive: Top-normal size prostate. Other: No pneumoperitoneum, ascites or focal fluid collection. Musculoskeletal: No aggressive appearing focal osseous lesions. Marked thoracolumbar spondylosis. IMPRESSION: 1. Known pancreatic head malignancy is unchanged. Well-positioned CBD stent with pneumobilia in the intrahepatic bile ducts indicating stent patency. 2. Cholelithiasis. New diffuse gallbladder wall thickening and pericholecystic fat stranding. Acute cholecystitis cannot be excluded. 3. Diffuse liver metastases have mildly increased in size. 4. Bilateral pulmonary nodules at the lung bases, mildly increased in size at the left lung base, worrisome for pulmonary metastases. 5. Stable mild porta hepatis adenopathy. 6. Stable right adrenal adenoma. 7. No evidence of bowel obstruction or acute bowel inflammation. 8.  Aortic Atherosclerosis (ICD10-I70.0). Electronically Signed   By: Ilona Sorrel M.D.   On: 05/20/2019 16:04   Ct Abdomen Pelvis Wo Contrast  Result Date: 05/03/2019 CLINICAL DATA:  Transaminitis EXAM: CT ABDOMEN AND PELVIS WITHOUT CONTRAST TECHNIQUE:  Multidetector CT imaging  of the abdomen and pelvis was performed following the standard protocol without IV contrast. COMPARISON:  None. FINDINGS: Lower chest: There is a partially visualized 6 mm pulmonary nodule in the left lower lobe (axial series 3, image 1). There is an additional opacity in the left lower lobe measuring approximately 7 mm (axial series 3, image 9). There is mucous plugging and bronchiectasis involving the medial right lung base.The heart size is normal. Hepatobiliary: There are multiple indeterminate hypoattenuating masses in the liver most notably within the right hepatic lobe. These measure up to approximately 1.6 cm. Multiple gallstones versus gallbladder sludge is noted. The common bile duct is mildly dilated measuring up to approximately 9 mm proximally. There appears to be some intrahepatic biliary ductal dilatation. Pancreas: There is an ill-defined soft tissue density at the pancreatic head. This is not well characterized in the absence of IV contrast. This measures approximately 3.5 x 3.8 cm (axial series 3, image 30). Spleen: No splenic laceration or hematoma. Adrenals/Urinary Tract: --Adrenal glands: No adrenal hemorrhage. --Right kidney/ureter: Multiple right-sided renal cysts are noted. There is no hydronephrosis. --Left kidney/ureter: No hydronephrosis or perinephric hematoma. --Urinary bladder: Unremarkable. Stomach/Bowel: --Stomach/Duodenum: There is a small hiatal hernia. --Small bowel: No dilatation or inflammation. --Colon: The patient is status post partial colectomy. A surgical anastomosis is noted at the level of the hepatic flexure. There is no evidence for an obstruction. --Appendix: The appendix is likely surgically absent. Vascular/Lymphatic: Atherosclerotic calcification is present within the non-aneurysmal abdominal aorta, without hemodynamically significant stenosis. --No retroperitoneal lymphadenopathy. --No mesenteric lymphadenopathy. --No pelvic or inguinal lymphadenopathy.  Reproductive: Unremarkable Other: No ascites or free air. There is a right fat containing inguinal hernia. Musculoskeletal. There is a somewhat irregular appearance of the inferior endplate of the L3 vertebral body. This is favored to represent an atypical Schmorl's node. Degenerative changes are noted throughout the visualized lumbar spine. There is grade 1 anterolisthesis of L4 on L5. There is no acute osseous abnormality. IMPRESSION: 1. Distended gallbladder with evidence of gallbladder sludge versus cholelithiasis. There is probable intrahepatic and extrahepatic biliary ductal dilatation which is not well evaluated in the absence of IV contrast. If there is clinical concern for an obstructing process such as choledocholithiasis, follow-up with MRCP/ERCP is recommended. There is no CT evidence for acute cholecystitis. 2. Multiple small hypoattenuating masses throughout the liver as detailed above. These are incompletely characterized on this exam. Follow-up with a contrast-enhanced MRI is recommended. 3. Somewhat ill-defined soft tissue density at the level of the pancreatic head. This is not well evaluated in the absence of IV contrast. Follow-up with a contrast-enhanced MRI is recommended. 4. Status post partial colectomy.  No evidence of an obstruction. 5. Multiple pulmonary nodules in the left lower lobe measuring up to approximately 7 mm. Non-contrast chest CT at 3-6 months is recommended. If the nodules are stable at time of repeat CT, then future CT at 18-24 months (from today's scan) is considered optional for low-risk patients, but is recommended for high-risk patients. This recommendation follows the consensus statement: Guidelines for Management of Incidental Pulmonary Nodules Detected on CT Images: From the Fleischner Society 2017; Radiology 2017; 284:228-243. Electronically Signed   By: Constance Holster M.D.   On: 05/03/2019 18:27   Dg Chest 2 View  Result Date: 05/03/2019 CLINICAL DATA:  Fall.  EXAM: CHEST - 2 VIEW COMPARISON:  August 13, 2015 FINDINGS: The heart size and mediastinal contours are within normal limits. Both lungs are clear. The visualized skeletal  structures are unremarkable. IMPRESSION: No active cardiopulmonary disease. Electronically Signed   By: Dorise Bullion III M.D   On: 05/03/2019 14:13   Ct Head Wo Contrast  Result Date: 05/03/2019 CLINICAL DATA:  Weakness with recent fall.  No known head injury. EXAM: CT HEAD WITHOUT CONTRAST TECHNIQUE: Contiguous axial images were obtained from the base of the skull through the vertex without intravenous contrast. COMPARISON:  MRI brain 09/22/2013. FINDINGS: Brain: There is no evidence of acute intracranial hemorrhage, mass lesion, brain edema or extra-axial fluid collection. There is stable atrophy with mild prominence of the ventricles and subarachnoid spaces. There are mild chronic small vessel ischemic changes within the periventricular white matter which are similar to previous MRI. There is no CT evidence of acute cortical infarction. Vascular: Prominent intracranial vascular calcifications. No hyperdense vessel identified. Skull: Negative for fracture or focal lesion. Sinuses/Orbits: The visualized paranasal sinuses and mastoid air cells are clear. No orbital abnormalities are seen. Other: None. IMPRESSION: Stable atrophy and mild chronic small vessel ischemic changes. No acute intracranial findings. Electronically Signed   By: Richardean Sale M.D.   On: 05/03/2019 15:58   Mr 3d Recon At Scanner  Result Date: 05/04/2019 CLINICAL DATA:  Abnormal liver function tests. CT demonstrating possible pancreatic and liver lesions. EXAM: MRI ABDOMEN WITHOUT AND WITH CONTRAST (INCLUDING MRCP) TECHNIQUE: Multiplanar multisequence MR imaging of the abdomen was performed both before and after the administration of intravenous contrast. Heavily T2-weighted images of the biliary and pancreatic ducts were obtained, and three-dimensional MRCP images  were rendered by post processing. CONTRAST:  10 cc Gadavist COMPARISON:  CT of 1 day prior FINDINGS: Lower chest: Normal heart size without pericardial or pleural effusion. Tiny hiatal hernia. Left lower lobe pulmonary nodules including on series 26 are not well evaluated. Hepatobiliary: Bilateral liver lesions which are most consistent with metastasis. Example in the left hepatic lobe at 1.0 cm on 44/20. In the posterior right hepatic lobe at 11 mm on image 35/20. Small gallstones including on 24/6. Gallbladder distension but no specific evidence of acute cholecystitis. Mild intrahepatic biliary duct dilatation. The common duct measures 12 mm on 34/12. No choledocholithiasis. Pancreas: Atrophy with main duct and side branch dilatation throughout the body, neck, tail. This continues to the level of a pancreatic head mass which measures 4.4 x 4.0 cm on image 66/20. No superimposed pancreatitis. Spleen:  Normal in size, without focal abnormality. Adrenals/Urinary Tract: Normal left adrenal gland. Minimal right adrenal nodularity. This consistent with an adenoma of 8 mm on 46/9. Bilateral renal cysts. An upper pole left renal lesion measures 1.8 cm and demonstrates precontrast heterogeneous T1 signal. Equivocal post-contrast enhancement, including on subtracted image 54/25. Stomach/Bowel: Normal distal stomach and abdominal bowel loops. Vascular/Lymphatic: Aortic atherosclerosis. There is involvement of the superior mesenteric vein and splenoportal confluence by tumor, including on 65/20. No abdominal adenopathy. Other:  No ascites.  No evidence of omental or peritoneal disease. Musculoskeletal: No acute osseous abnormality. IMPRESSION: 1. Pancreatic head mass is consistent with adenocarcinoma. This causes biliary and pancreatic duct dilatation. 2. Bilateral hepatic metastasis. 3. Cholelithiasis without evidence of acute cholecystitis. 4. Upper pole left renal lesion is indeterminate and could represent a complex cyst  or a mildly enhancing renal cell carcinoma. Of questionable clinical significance given age and comorbidities. 5. Left lung base nodules are suboptimally evaluated. If complete staging is desired, consider chest CT. 6. Right adrenal adenoma. Electronically Signed   By: Abigail Miyamoto M.D.   On: 05/04/2019 18:14   Dg  Chest Port 1 View  Result Date: 05/20/2019 CLINICAL DATA:  Weakness, fall EXAM: PORTABLE CHEST 1 VIEW COMPARISON:  Radiograph May 03, 2019 FINDINGS: Bandlike areas of scarring and/or subsegmental atelectasis seen in both bases, right greater than left. Central vascular crowding. No consolidation, features of edema, pneumothorax, or effusion. Pulmonary vascularity is normally distributed. The aorta is calcified. The remaining cardiomediastinal contours are unremarkable. No acute osseous or soft tissue abnormality. Degenerative changes are present in the imaged spine and shoulders. IMPRESSION: Basilar atelectasis and/or scarring. Otherwise no acute cardiopulmonary or traumatic abnormality Electronically Signed   By: Lovena Le M.D.   On: 05/20/2019 18:22   Dg Knee Complete 4 Views Left  Result Date: 05/03/2019 CLINICAL DATA:  Pain after fall EXAM: LEFT KNEE - COMPLETE 4+ VIEW COMPARISON:  None. FINDINGS: Patient is status post left knee replacement. Hardware is in good position. No fracture or dislocation identified. No effusions. Vascular calcifications noted. IMPRESSION: No acute abnormalities. Electronically Signed   By: Dorise Bullion III M.D   On: 05/03/2019 14:14   Dg Ercp Biliary & Pancreatic Ducts  Result Date: 05/05/2019 CLINICAL DATA:  Obstructive jaundice EXAM: ERCP TECHNIQUE: Multiple spot images obtained with the fluoroscopic device and submitted for interpretation post-procedure. COMPARISON:  MRCP from the previous day FINDINGS: Series of fluoroscopic spot images document endoscopic cannulation and opacification of the CBD. A plastic pancreatic duct stent is noted. There is an  occluded segment in the mid CBD, mildly distended proximally, decompressed distally. Limited opacification of the intrahepatic biliary tree. Subsequent images document placement of a metallic biliary stent across the lesion. IMPRESSION: Endoscopic CBD cannulation and intervention with metallic stent placement. These images were submitted for radiologic interpretation only. Please see the procedural report for the amount of contrast and the fluoroscopy time utilized. Electronically Signed   By: Lucrezia Europe M.D.   On: 05/05/2019 15:13   Dg Abd 2 Views  Result Date: 05/12/2019 CLINICAL DATA:  Follow-up stent placement EXAM: ABDOMEN - 2 VIEW COMPARISON:  [id number] FINDINGS: Metallic biliary stent is again identified with central narrowing similar to the known underlying mass lesion. The plastic pancreatic stent is no longer identified and has likely passed. Degenerative changes of lumbar spine are seen. No free air is noted. Nonobstructive bowel gas pattern is seen. IMPRESSION: Metallic biliary stent remains in place. Previously placed pancreatic stent has passed. Electronically Signed   By: Inez Catalina M.D.   On: 05/12/2019 12:20   Mr Abdomen Mrcp Moise Boring Contast  Result Date: 05/04/2019 CLINICAL DATA:  Abnormal liver function tests. CT demonstrating possible pancreatic and liver lesions. EXAM: MRI ABDOMEN WITHOUT AND WITH CONTRAST (INCLUDING MRCP) TECHNIQUE: Multiplanar multisequence MR imaging of the abdomen was performed both before and after the administration of intravenous contrast. Heavily T2-weighted images of the biliary and pancreatic ducts were obtained, and three-dimensional MRCP images were rendered by post processing. CONTRAST:  10 cc Gadavist COMPARISON:  CT of 1 day prior FINDINGS: Lower chest: Normal heart size without pericardial or pleural effusion. Tiny hiatal hernia. Left lower lobe pulmonary nodules including on series 26 are not well evaluated. Hepatobiliary: Bilateral liver lesions which  are most consistent with metastasis. Example in the left hepatic lobe at 1.0 cm on 44/20. In the posterior right hepatic lobe at 11 mm on image 35/20. Small gallstones including on 24/6. Gallbladder distension but no specific evidence of acute cholecystitis. Mild intrahepatic biliary duct dilatation. The common duct measures 12 mm on 34/12. No choledocholithiasis. Pancreas: Atrophy with main duct  and side branch dilatation throughout the body, neck, tail. This continues to the level of a pancreatic head mass which measures 4.4 x 4.0 cm on image 66/20. No superimposed pancreatitis. Spleen:  Normal in size, without focal abnormality. Adrenals/Urinary Tract: Normal left adrenal gland. Minimal right adrenal nodularity. This consistent with an adenoma of 8 mm on 46/9. Bilateral renal cysts. An upper pole left renal lesion measures 1.8 cm and demonstrates precontrast heterogeneous T1 signal. Equivocal post-contrast enhancement, including on subtracted image 54/25. Stomach/Bowel: Normal distal stomach and abdominal bowel loops. Vascular/Lymphatic: Aortic atherosclerosis. There is involvement of the superior mesenteric vein and splenoportal confluence by tumor, including on 65/20. No abdominal adenopathy. Other:  No ascites.  No evidence of omental or peritoneal disease. Musculoskeletal: No acute osseous abnormality. IMPRESSION: 1. Pancreatic head mass is consistent with adenocarcinoma. This causes biliary and pancreatic duct dilatation. 2. Bilateral hepatic metastasis. 3. Cholelithiasis without evidence of acute cholecystitis. 4. Upper pole left renal lesion is indeterminate and could represent a complex cyst or a mildly enhancing renal cell carcinoma. Of questionable clinical significance given age and comorbidities. 5. Left lung base nodules are suboptimally evaluated. If complete staging is desired, consider chest CT. 6. Right adrenal adenoma. Electronically Signed   By: Abigail Miyamoto M.D.   On: 05/04/2019 18:14   Dg  Hip Unilat W Or Wo Pelvis 2-3 Views Left  Result Date: 05/03/2019 CLINICAL DATA:  Left hip pain post fall. EXAM: DG HIP (WITH OR WITHOUT PELVIS) 2-3V LEFT COMPARISON:  None. FINDINGS: There is no evidence of hip fracture or dislocation. Mild osteoarthritic changes of bilateral hips. Vascular calcifications noted. IMPRESSION: No acute fracture or dislocation identified about the left hip. Electronically Signed   By: Fidela Salisbury M.D.   On: 05/03/2019 14:14   US Abdomen Limited Ruq  Result Date: 05/20/2019 CLINICAL DATA:  Cholecystitis EXAM: ULTRASOUND ABDOMEN LIMITED RIGHT UPPER QUADRANT COMPARISON:  Same day CT FINDINGS: Gallbladder: There is a mixture of biliary sludge and echogenic shadowing gallstones with several hyperechogenic, dirty shadowing foci of intraluminal gas. There is a thickened echogenic gallbladder wall measuring up to 8.4 mm. Small volume of pericholecystic fluid is present.Sonographic Percell Miller sign is reportedly negative. Common bile duct: Diameter: 5.2 mm, not dilated. Biliary stent present in the proximal common bile duct. Liver: Difficult visualization of entire liver parenchyma due to overlying bowel gas. Several echogenic shadowing foci within the liver are likely reflective the pneumatosis seen on prior CT. Portal vein is patent on color Doppler imaging with normal direction of blood flow towards the liver. Other: None. IMPRESSION: Biliary stent in place with mild intrahepatic biliary ductal dilatation and pneumatosis. Cholelithiasis and biliary sludge in with foci of intraluminal gas. Wall thickening and pericholecystic fluid are suggestive of acute cholecystitis though sonographic Percell Miller sign is reportedly negative. Could correlate for history of analgesia which may limit the diagnostic utility of the sonographic Murphy sign. Electronically Signed   By: Lovena Le M.D.   On: 05/20/2019 20:08    ASSESSMENT AND PLAN: 1. Pancreas cancer ? Pancreas head mass, biliary ductal  dilatation, left lung nodules, and liver lesions noted on a CT abdomen/pelvis 05/03/2019 ? MRI abdomen 05/04/2019- pancreas mass, biliary and pancreatic duct dilatation, bilateral hepatic metastases, indeterminate left upper pole renal lesion, left lung base nodules ? ERCP 05/05/2019- duct stricture, placement of a common bile duct stent and duct brushings ? Cytology brushings 05/05/2019-"suspicious "for malignancy 2. Anorexia/weight loss secondary to #1 3.   Pain secondary to #1 4.  Nausea 5.  Diabetes 6.  Right colon cancer, stage III (T4N2, N0) 2003, status post adjuvant chemotherapy 7.  Prostate cancer 8.  05/20/2019-hospital admission for generalized weakness, abdominal discomfort, questionable acute cholecystitis  Mr. Spanbauer is now admitted for generalized weakness, dehydration, and questionable acute cholecystitis.  He seems a feeling better this morning after receiving IV fluids and IV antibiotics.  A general surgery consult is pending.  The patient and his wife contacted our office earlier this week stating that he did not wish to pursue chemotherapy and therefore his liver biopsy was canceled.  The patient is considering hospice services.  1.  Care management consult has been placed for evaluation for home hospice with [facility].  2.  Further management of possible cholecystitis per general surgery.   LOS: 0 days   Mikey Bussing, DNP, AGPCNP-BC, AOCNP 05/21/19 Mr. Medica was interviewed and examined.  I reviewed the CT images.  He was admitted yesterday with failure to thrive.  He has persistent abdominal pain and nausea.  His symptoms are likely secondary to metastatic pancreas cancer, but he may have cholecystitis.  Mr. Rients indicated he does not wish to consider chemotherapy for treatment of the metastatic pancreas cancer.  He agrees to a hospice referral.  We will make a referral to [facility] to consider home hospice versus [facility].  He may be a candidate for a palliative  cholecystostomy if acute cholecystitis is confirmed.    I will plan to follow him with the hospice team at discharge.

## 2019-05-21 NOTE — Consult Note (Signed)
[facility] Surgery Consult Note  Damon Snyder. Feb 21, 1928  [id number].    Requesting MD: Dr. Grandville Silos Chief Complaint/Reason for Consult: Possible cholecystitis   HPI: Damon Snyder. is a 85 y.o. male with with a medical history significant for recently diagnosed pancreatic cancer with likely metastatic disease to liver and possibly lungs who is status post CBD stent placement on 8/10 by Dr. Watt Climes, currently followed by Dr. Benay Spice of Oncology and has not yet begun treatment for this; a remote history of colon cancer status post chemotherapy and colectomy, history of prostate cancer status post radiation treatment, hypertension, hyperlipidemia and insulin-dependent diabetes who presented to the ED yesterday for weakness.  Patient is unable to provide me much of a history.  There is no family at bedside.  He reports that he has upper abdominal pain (points to his epigastrium) over the last several days and has not been able to eat as he becomes nauseous and vomits.  He does not believe he has had this pain in the past. He denies radiation of the pain. The pain is worsened with palpation.  His pain is improved since admission. He is unsure about bowel movements but per other notes it appears he has been having dark brown stool..  Only prior abdominal surgery is colectomy as noted above.  He is unable to tell me the specifics of this and I am not able to find this in chart review.  Work-up in the ED showed known pancreatic head malignancy, diffuse liver metastasis, bilateral pulmonary nodules mildly increased in size in the left lung base worrisome for pulmonary mets.  This also showed cholelithiasis with gallbladder wall thickening and pericholecystic fat stranding.  Right upper quadrant ultrasound showed cholelithiasis and biliary sludge with gallbladder wall thickening and pericholecystic fluid suggestive of acute cholecystitis.  White blood cell count was elevated at 17.8.  T bili 2.0, AST 60,  ALT 55, alk phos 182.  General surgery was asked to see for possible acute cholecystitis.  ROS: Review of Systems  Constitutional: Negative for chills and fever.  Respiratory: Negative for shortness of breath.   Cardiovascular: Negative for chest pain and leg swelling.  Gastrointestinal: Positive for abdominal pain, melena (possible per notes, negative FOCT), nausea and vomiting. Negative for blood in stool and diarrhea.  Genitourinary: Negative for dysuria.  All other systems reviewed and are negative. All systems reviewed and otherwise negative except for as above  Family History  Problem Relation Age of Onset  . Hypertension Mother   . Cancer Sister        Ovarian  . Hypertension Father   . Heart disease Brother        After age 57  . Kidney disease Sister     Past Medical History:  Diagnosis Date  . Anemia    h/o   . Arthritis    "left knee; shoulders" (09/14/2016)  . Colon cancer ([place])     tx : chemo)   . Difficulty in walking(719.7)   . Dyspnea    relative to pain in her leg  . Foot ulcer ([place])   . Gout   . High cholesterol   . History of blood transfusion    "that's when they found out about the colon cancer"  . Hypertension   . Lower GI bleed   . Prostate cancer [facility])     treated with radiation   . Type II diabetes mellitus ([place])     Past Surgical History:  Procedure  Laterality Date  . BILIARY BRUSHING  05/05/2019   Procedure: BILIARY BRUSHING;  Surgeon: Clarene Essex, MD;  Location: [facility] ENDOSCOPY;  Service: Endoscopy;;  . BILIARY STENT PLACEMENT  05/05/2019   Procedure: BILIARY STENT PLACEMENT;  Surgeon: Clarene Essex, MD;  Location: [place];  Service: Endoscopy;;  . CATARACT EXTRACTION W/ INTRAOCULAR LENS  IMPLANT, BILATERAL Bilateral   . COLECTOMY    . ENDOSCOPIC RETROGRADE CHOLANGIOPANCREATOGRAPHY (ERCP) WITH PROPOFOL N/A 05/05/2019   Procedure: ENDOSCOPIC RETROGRADE CHOLANGIOPANCREATOGRAPHY (ERCP) WITH PROPOFOL;  Surgeon: Clarene Essex, MD;  Location: [place];  Service: Endoscopy;  Laterality: N/A;  . JOINT REPLACEMENT    . PANCREATIC STENT PLACEMENT  05/05/2019   Procedure: PANCREATIC STENT PLACEMENT;  Surgeon: Clarene Essex, MD;  Location: [place];  Service: Endoscopy;;  . PROSTATE BIOPSY    . SPHINCTEROTOMY  05/05/2019   Procedure: SPHINCTEROTOMY;  Surgeon: Clarene Essex, MD;  Location: [facility] ENDOSCOPY;  Service: Endoscopy;;  . TOTAL KNEE ARTHROPLASTY Left 09/14/2016  . TOTAL KNEE ARTHROPLASTY Left 09/14/2016   Procedure: LEFT TOTAL KNEE ARTHROPLASTY;  Surgeon: Leandrew Koyanagi, MD;  Location: [place];  Service: Orthopedics;  Laterality: Left;    Social History:  reports that he has never smoked. He has never used smokeless tobacco. He reports that he does not drink alcohol or use drugs.  Allergies: No Known Allergies  Medications Prior to Admission  Medication Sig Dispense Refill  . allopurinol (ZYLOPRIM) 100 MG tablet Take 200 mg by mouth daily as needed (gout).     Marland Kitchen aspirin EC 325 MG tablet Take 1 tablet (325 mg total) by mouth 2 (two) times daily. 84 tablet 0  . atorvastatin (LIPITOR) 20 MG tablet Take 20 mg by mouth daily.    . diclofenac sodium (VOLTAREN) 1 % GEL Apply 4 g topically 4 (four) times daily as needed. (Patient taking differently: Apply 2 g topically 4 (four) times daily as needed (for pain). ) 500 g 6  . diphenhydrAMINE (BENADRYL) 25 mg capsule Take 25 mg by mouth every 6 (six) hours as needed for itching or allergies.    Marland Kitchen LANTUS SOLOSTAR 100 UNIT/ML Solostar Pen Inject 50 Units into the skin at bedtime.     . Nebivolol HCl (BYSTOLIC) 20 MG TABS Take 20 mg by mouth daily.    Marland Kitchen NIFEdipine (ADALAT CC) 60 MG 24 hr tablet Take 1 tablet (60 mg total) by mouth daily. 30 tablet 0  . ondansetron (ZOFRAN) 8 MG tablet Take 1 tablet (8 mg total) by mouth every 8 (eight) hours as needed for nausea or vomiting. 20 tablet 3  . oxyCODONE (OXY IR/ROXICODONE) 5 MG immediate release tablet Take 1 tablet (5 mg total) by mouth every 4  (four) hours as needed for severe pain. 60 tablet 0  . sodium chloride (OCEAN) 0.65 % SOLN nasal spray Place 1 spray into both nostrils as needed for congestion.    . tamsulosin (FLOMAX) 0.4 MG CAPS capsule Take 1 capsule (0.4 mg total) by mouth daily after supper. 30 capsule 30  . NOVOFINE 32G X 6 MM MISC     . ONETOUCH VERIO test strip 1 STRIP TWICE A DAY FOR BLOOD SUGAR DX E11.22 FINGERSTICK      Prior to Admission medications   Medication Sig Start Date End Date Taking? Authorizing Provider  allopurinol (ZYLOPRIM) 100 MG tablet Take 200 mg by mouth daily as needed (gout).    Yes [provider]  aspirin EC 325 MG tablet Take 1 tablet (325 mg total)  by mouth 2 (two) times daily. 09/14/16  Yes Leandrew Koyanagi, MD  atorvastatin (LIPITOR) 20 MG tablet Take 20 mg by mouth daily.   Yes [provider]  diclofenac sodium (VOLTAREN) 1 % GEL Apply 4 g topically 4 (four) times daily as needed. Patient taking differently: Apply 2 g topically 4 (four) times daily as needed (for pain).  04/21/19  Yes Hilts, Legrand Como, MD  diphenhydrAMINE (BENADRYL) 25 mg capsule Take 25 mg by mouth every 6 (six) hours as needed for itching or allergies.   Yes [provider]  LANTUS SOLOSTAR 100 UNIT/ML Solostar Pen Inject 50 Units into the skin at bedtime.  11/17/14  Yes [provider]  Nebivolol HCl (BYSTOLIC) 20 MG TABS Take 20 mg by mouth daily.   Yes [provider]  NIFEdipine (ADALAT CC) 60 MG 24 hr tablet Take 1 tablet (60 mg total) by mouth daily. 05/08/19  Yes Vasireddy, Grier Mitts, MD  ondansetron (ZOFRAN) 8 MG tablet Take 1 tablet (8 mg total) by mouth every 8 (eight) hours as needed for nausea or vomiting. 05/19/19  Yes Ladell Pier, MD  oxyCODONE (OXY IR/ROXICODONE) 5 MG immediate release tablet Take 1 tablet (5 mg total) by mouth every 4 (four) hours as needed for severe pain. 05/19/19  Yes Ladell Pier, MD  sodium chloride (OCEAN) 0.65 % SOLN nasal spray Place 1  spray into both nostrils as needed for congestion.   Yes [provider]  tamsulosin (FLOMAX) 0.4 MG CAPS capsule Take 1 capsule (0.4 mg total) by mouth daily after supper. 05/07/19  Yes Monica Becton, MD  NOVOFINE 32G X 6 MM MISC  03/11/13   [provider]  [facility] VERIO test strip 1 STRIP TWICE A DAY FOR BLOOD SUGAR DX E11.22 FINGERSTICK 09/09/18   [provider]    Blood pressure (!) 117/52, pulse 69, temperature 98.6 F (37 C), temperature source Oral, resp. rate 18, height 6' 4"  (1.93 m), weight 102.8 kg, SpO2 94 %. Physical Exam: General: frail elderly male who is laying in bed in NAD HEENT: head is normocephalic, atraumatic.  Sclera are noninjected and without icterus.  Pupils equal and round.  Ears and nose without any masses or lesions.  Mouth is pink and moist.  Heart: regular, rate, and rhythm.  Palpable radial pulses bilaterally Lungs: CTA b/l.  Respiratory effort nonlabored Abd: Soft, mildly distended with tenderness in the epigastrium without r/r/g. +BS MS: all 4 extremities without edema. Skin: warm and dry with no rashes visualized Psych: Appropriate affect.  Results for orders placed or performed during the hospital encounter of 05/20/19 (from the past 48 hour(s))  Comprehensive metabolic panel     Status: Abnormal   Collection Time: 05/20/19  1:45 PM  Result Value Ref Range   Sodium 135 135 - 145 mmol/L   Potassium 3.6 3.5 - 5.1 mmol/L   Chloride 97 (L) 98 - 111 mmol/L   CO2 22 22 - 32 mmol/L   Glucose, Bld 133 (H) 70 - 99 mg/dL   BUN 61 (H) 8 - 23 mg/dL   Creatinine, Ser 2.59 (H) 0.61 - 1.24 mg/dL   Calcium 8.6 (L) 8.9 - 10.3 mg/dL   Total Protein 6.8 6.5 - 8.1 g/dL   Albumin 2.6 (L) 3.5 - 5.0 g/dL   AST 60 (H) 15 - 41 U/L   ALT 55 (H) 0 - 44 U/L   Alkaline Phosphatase 182 (H) 38 - 126 U/L   Total Bilirubin 2.0 (H) 0.3 -  1.2 mg/dL   GFR calc non Af Amer 21 (L) >60 mL/min   GFR calc Af Amer 24 (L) >60 mL/min   Anion gap 16 (H) 5  - 15    Comment: Performed at [facility], [place] [address], [place], [place] [address]  CBC with Differential     Status: Abnormal   Collection Time: 05/20/19  1:45 PM  Result Value Ref Range   WBC 17.8 (H) 4.0 - 10.5 K/uL   RBC 3.36 (L) 4.22 - 5.81 MIL/uL   Hemoglobin 9.6 (L) 13.0 - 17.0 g/dL   HCT 30.4 (L) 39.0 - 52.0 %   MCV 90.5 80.0 - 100.0 fL   MCH 28.6 26.0 - 34.0 pg   MCHC 31.6 30.0 - 36.0 g/dL   RDW 14.9 11.5 - 15.5 %   Platelets 229 150 - 400 K/uL   nRBC 0.0 0.0 - 0.2 %   Neutrophils Relative % 91 %   Neutro Abs 16.2 (H) 1.7 - 7.7 K/uL   Lymphocytes Relative 3 %   Lymphs Abs 0.5 (L) 0.7 - 4.0 K/uL   Monocytes Relative 5 %   Monocytes Absolute 1.0 0.1 - 1.0 K/uL   Eosinophils Relative 0 %   Eosinophils Absolute 0.0 0.0 - 0.5 K/uL   Basophils Relative 0 %   Basophils Absolute 0.0 0.0 - 0.1 K/uL   Immature Granulocytes 1 %   Abs Immature Granulocytes 0.13 (H) 0.00 - 0.07 K/uL    Comment: Performed at [facility], [place] [address], [place], [place] [address]  SARS CORONAVIRUS 2 (TAT 6-12 HRS) Nasal Swab Aptima Multi Swab     Status: None   Collection Time: 05/20/19  3:40 PM   Specimen: Aptima Multi Swab; Nasal Swab  Result Value Ref Range   SARS Coronavirus 2 NEGATIVE NEGATIVE    Comment: (NOTE) SARS-CoV-2 target nucleic acids are NOT DETECTED. The SARS-CoV-2 RNA is generally detectable in upper and lower respiratory specimens during the acute phase of infection. Negative results do not preclude SARS-CoV-2 infection, do not rule out co-infections with other pathogens, and should not be used as the sole basis for treatment or other patient management decisions. Negative results must be combined with clinical observations, patient history, and epidemiological information. The expected result is Negative. Fact Sheet for Patients: SugarRoll.be Fact Sheet for Healthcare  Providers: https://www.woods-mathews.com/ This test is not yet approved or cleared by the [place] FDA and  has been authorized for detection and/or diagnosis of SARS-CoV-2 by FDA under an Emergency Use Authorization (EUA). This EUA will remain  in effect (meaning this test can be used) for the duration of the COVID-19 declaration under Section 56 4(b)(1) of the Act, 21 U.S.C. section 360bbb-3(b)(1), unless the authorization is terminated or revoked sooner. Performed at [facility], [place] [address] [place], [place] [address]   POC occult blood, ED Provider will collect     Status: None   Collection Time: 05/20/19  4:01 PM  Result Value Ref Range   Fecal Occult Bld NEGATIVE NEGATIVE  Magnesium     Status: None   Collection Time: 05/20/19  8:15 PM  Result Value Ref Range   Magnesium 2.3 1.7 - 2.4 mg/dL    Comment: Performed at [facility], [place] [address] [place], [place] [address]  Glucose, capillary     Status: Abnormal   Collection Time: 05/20/19  8:26 PM  Result Value Ref Range   Glucose-Capillary 112 (H) 70 -  99 mg/dL   Comment 1 Notify RN    Comment 2 Document in Chart   Urinalysis, Routine w reflex microscopic     Status: Abnormal   Collection Time: 05/21/19  4:26 AM  Result Value Ref Range   Color, Urine YELLOW YELLOW   APPearance HAZY (A) CLEAR   Specific Gravity, Urine 1.015 1.005 - 1.030   pH 5.0 5.0 - 8.0   Glucose, UA NEGATIVE NEGATIVE mg/dL   Hgb urine dipstick NEGATIVE NEGATIVE   Bilirubin Urine NEGATIVE NEGATIVE   Ketones, ur NEGATIVE NEGATIVE mg/dL   Protein, ur NEGATIVE NEGATIVE mg/dL   Nitrite NEGATIVE NEGATIVE   Leukocytes,Ua NEGATIVE NEGATIVE    Comment: Performed at [place] [address], [place], [place] [address]  Sodium, urine, random     Status: None   Collection Time: 05/21/19  4:26 AM  Result Value Ref Range   Sodium, Ur <10 mmol/L    Comment: Performed at [facility], [place] [address], [place], [place] [address]  Creatinine, urine, random     Status: None   Collection Time: 05/21/19  4:26 AM  Result Value Ref Range   Creatinine, Urine 109.26 mg/dL    Comment: Performed at [facility], [place] [address], [place], [place] [address]  Comprehensive metabolic panel     Status: Abnormal   Collection Time: 05/21/19  4:35 AM  Result Value Ref Range   Sodium 137 135 - 145 mmol/L   Potassium 4.0 3.5 - 5.1 mmol/L   Chloride 104 98 - 111 mmol/L   CO2 22 22 - 32 mmol/L   Glucose, Bld 69 (L) 70 - 99 mg/dL   BUN 57 (H) 8 - 23 mg/dL   Creatinine, Ser 2.03 (H) 0.61 - 1.24 mg/dL   Calcium 8.4 (L) 8.9 - 10.3 mg/dL   Total Protein 6.4 (L) 6.5 - 8.1 g/dL   Albumin 2.5 (L) 3.5 - 5.0 g/dL   AST 70 (H) 15 - 41 U/L   ALT 57 (H) 0 - 44 U/L   Alkaline Phosphatase 162 (H) 38 - 126 U/L   Total Bilirubin 1.6 (H) 0.3 - 1.2 mg/dL   GFR calc non Af Amer 28 (L) >60 mL/min   GFR calc Af Amer 33 (L) >60 mL/min   Anion gap 11 5 - 15    Comment: Performed at [facility], [place] [address], [place], [place] [address]  CBC WITH DIFFERENTIAL     Status: Abnormal   Collection Time: 05/21/19  4:35 AM  Result Value Ref Range   WBC 15.4 (H) 4.0 - 10.5 K/uL   RBC 3.16 (L) 4.22 - 5.81 MIL/uL   Hemoglobin 9.1 (L) 13.0 - 17.0 g/dL   HCT 29.2 (L) 39.0 - 52.0 %   MCV 92.4 80.0 - 100.0 fL   MCH 28.8 26.0 - 34.0 pg   MCHC 31.2 30.0 - 36.0 g/dL   RDW 15.0 11.5 - 15.5 %   Platelets 223 150 - 400 K/uL   nRBC 0.0 0.0 - 0.2 %   Neutrophils Relative % 85 %   Neutro Abs 13.2 (H) 1.7 - 7.7 K/uL   Lymphocytes Relative 5 %   Lymphs Abs 0.8 0.7 - 4.0 K/uL   Monocytes Relative 8 %   Monocytes Absolute 1.2 (H) 0.1 - 1.0 K/uL   Eosinophils Relative 1 %   Eosinophils Absolute 0.1 0.0 - 0.5 K/uL   Basophils Relative 0 %   Basophils Absolute  0.0 0.0 - 0.1 K/uL   Immature Granulocytes 1 %   Abs Immature Granulocytes 0.11 (H) 0.00 - 0.07 K/uL     Comment: Performed at [facility], [place] [address], [place], [place] [address]  Vitamin B12     Status: None   Collection Time: 05/21/19  4:35 AM  Result Value Ref Range   Vitamin B-12 772 180 - 914 pg/mL    Comment: (NOTE) This assay is not validated for testing neonatal or myeloproliferative syndrome specimens for Vitamin B12 levels. Performed at [facility], [place] [address] [place], [place] [address]   Folate     Status: None   Collection Time: 05/21/19  4:35 AM  Result Value Ref Range   Folate 8.4 >5.9 ng/mL    Comment: Performed at [facility], [place] [address] [place], [place] [address]  Iron and TIBC     Status: Abnormal   Collection Time: 05/21/19  4:35 AM  Result Value Ref Range   Iron 15 (L) 45 - 182 ug/dL   TIBC 130 (L) 250 - 450 ug/dL   Saturation Ratios 12 (L) 17.9 - 39.5 %   UIBC 115 ug/dL    Comment: Performed at [facility], [place] [address], [place], [place] [address]  Ferritin     Status: Abnormal   Collection Time: 05/21/19  4:35 AM  Result Value Ref Range   Ferritin 1,118 (H) 24 - 336 ng/mL    Comment: Performed at [facility], [place] [address], [place], [place] [address]  Reticulocytes     Status: Abnormal   Collection Time: 05/21/19  4:35 AM  Result Value Ref Range   Retic Ct Pct 1.0 0.4 - 3.1 %   RBC. 3.16 (L) 4.22 - 5.81 MIL/uL   Retic Count, Absolute 30.7 19.0 - 186.0 K/uL   Immature Retic Fract 7.8 2.3 - 15.9 %    Comment: Performed at [facility], [place] [address], [place], [place] [address]  Glucose, capillary     Status: Abnormal   Collection Time: 05/21/19  7:42 AM  Result Value Ref Range   Glucose-Capillary 49 (L) 70 - 99 mg/dL   Comment 1 Notify RN    Comment 2 Document in Chart   Glucose, capillary     Status: Abnormal   Collection Time: 05/21/19  9:12 AM  Result Value Ref Range   Glucose-Capillary 129 (H) 70 - 99 mg/dL   Ct  Abdomen Pelvis Wo Contrast  Result Date: 05/20/2019 CLINICAL DATA:  Nausea and vomiting. Left upper quadrant abdominal pain. History of recently diagnosed pancreatic cancer. Generalized weakness. Loss of appetite. EXAM: CT ABDOMEN AND PELVIS WITHOUT CONTRAST TECHNIQUE: Multidetector CT imaging of the abdomen and pelvis was performed following the standard protocol without IV contrast. COMPARISON:  05/04/2019 MRI abdomen.  05/03/2019 CT abdomen/pelvis. FINDINGS: Lower chest: Left lower lobe 6 mm pulmonary nodule (series 6/image 35), increased from 4 mm on 05/03/2019 CT. Right middle lobe peripheral 4 mm pulmonary nodule (series 6/image 4), not previously imaged. Mild-to-moderate bibasilar atelectasis. Coronary atherosclerosis. Low cardiac blood pool density indicates anemia. Hepatobiliary: Normal liver size. Numerous (greater than 8) small hypodense liver masses scattered throughout the liver, mildly increased since 05/03/2019 CT. Representative posterior right upper lobe 1.6 cm hypodense mass (series 2/image 12), increased from 1.2 cm. Lateral segment left liver lobe 1.2 cm mass (series 2/image 22), increased from 0.8 cm. Tiny layering calcified gallstones within the mildly distended gallbladder. New  mild diffuse gallbladder wall thickening and pericholecystic fat haziness. Scattered pneumobilia within the gallbladder and intrahepatic bile ducts. Well-positioned CBD stent terminating in the duodenal lumen. Pancreas: Known ill-defined pancreatic head mass measuring approximately 4.7 cm (series 2/image 34), not appreciably changed. Stable parenchymal atrophy and duct dilation throughout the pancreatic body and tail. Spleen: Normal size. No mass. Adrenals/Urinary Tract: Stable 1.0 cm right adrenal adenoma with density 1 HU. No discrete left adrenal nodules. No renal stones. No hydronephrosis. Previously described indeterminate renal cortical lesion in the upper left kidney on 05/04/2019 MRI is non contour deforming  and occult on this noncontrast CT. Bilateral simple renal cysts, largest 4.3 cm in the lateral interpolar right kidney. Normal bladder. Stomach/Bowel: Normal non-distended stomach. Normal caliber small bowel with no small bowel wall thickening. Postsurgical changes from subtotal right hemicolectomy with ileocolic anastomosis in the right abdomen. No large-bowel wall thickening, significant diverticulosis or significant pericolonic fat stranding. Vascular/Lymphatic: Atherosclerotic nonaneurysmal abdominal aorta. Stable mildly enlarged 1.1 cm porta hepatis node (series 2/image 29). No additional pathologically enlarged abdominopelvic nodes. Reproductive: Top-normal size prostate. Other: No pneumoperitoneum, ascites or focal fluid collection. Musculoskeletal: No aggressive appearing focal osseous lesions. Marked thoracolumbar spondylosis. IMPRESSION: 1. Known pancreatic head malignancy is unchanged. Well-positioned CBD stent with pneumobilia in the intrahepatic bile ducts indicating stent patency. 2. Cholelithiasis. New diffuse gallbladder wall thickening and pericholecystic fat stranding. Acute cholecystitis cannot be excluded. 3. Diffuse liver metastases have mildly increased in size. 4. Bilateral pulmonary nodules at the lung bases, mildly increased in size at the left lung base, worrisome for pulmonary metastases. 5. Stable mild porta hepatis adenopathy. 6. Stable right adrenal adenoma. 7. No evidence of bowel obstruction or acute bowel inflammation. 8.  Aortic Atherosclerosis (ICD10-I70.0). Electronically Signed   By: Ilona Sorrel M.D.   On: 05/20/2019 16:04   Dg Chest Port 1 View  Result Date: 05/20/2019 CLINICAL DATA:  Weakness, fall EXAM: PORTABLE CHEST 1 VIEW COMPARISON:  Radiograph May 03, 2019 FINDINGS: Bandlike areas of scarring and/or subsegmental atelectasis seen in both bases, right greater than left. Central vascular crowding. No consolidation, features of edema, pneumothorax, or effusion.  Pulmonary vascularity is normally distributed. The aorta is calcified. The remaining cardiomediastinal contours are unremarkable. No acute osseous or soft tissue abnormality. Degenerative changes are present in the imaged spine and shoulders. IMPRESSION: Basilar atelectasis and/or scarring. Otherwise no acute cardiopulmonary or traumatic abnormality Electronically Signed   By: Lovena Le M.D.   On: 05/20/2019 18:22   US Abdomen Limited Ruq  Result Date: 05/20/2019 CLINICAL DATA:  Cholecystitis EXAM: ULTRASOUND ABDOMEN LIMITED RIGHT UPPER QUADRANT COMPARISON:  Same day CT FINDINGS: Gallbladder: There is a mixture of biliary sludge and echogenic shadowing gallstones with several hyperechogenic, dirty shadowing foci of intraluminal gas. There is a thickened echogenic gallbladder wall measuring up to 8.4 mm. Small volume of pericholecystic fluid is present.Sonographic Percell Miller sign is reportedly negative. Common bile duct: Diameter: 5.2 mm, not dilated. Biliary stent present in the proximal common bile duct. Liver: Difficult visualization of entire liver parenchyma due to overlying bowel gas. Several echogenic shadowing foci within the liver are likely reflective the pneumatosis seen on prior CT. Portal vein is patent on color Doppler imaging with normal direction of blood flow towards the liver. Other: None. IMPRESSION: Biliary stent in place with mild intrahepatic biliary ductal dilatation and pneumatosis. Cholelithiasis and biliary sludge in with foci of intraluminal gas. Wall thickening and pericholecystic fluid are suggestive of acute cholecystitis though sonographic Percell Miller sign is reportedly negative. Could  correlate for history of analgesia which may limit the diagnostic utility of the sonographic Murphy sign. Electronically Signed   By: Lovena Le M.D.   On: 05/20/2019 20:08   Anti-infectives (From admission, onward)   Start     Dose/Rate Route Frequency Ordered Stop   05/21/19 0600   piperacillin-tazobactam (ZOSYN) IVPB 3.375 g     3.375 g 12.5 mL/hr over 240 Minutes Intravenous Every 8 hours 05/20/19 1820     05/20/19 1830  piperacillin-tazobactam (ZOSYN) IVPB 3.375 g     3.375 g 100 mL/hr over 30 Minutes Intravenous  Once 05/20/19 1819 05/20/19 2344      Assessment/Plan -Newly dx pancreatic cancer with likely metastatic disease to liver and possibly lungs - followed by Dr. Benay Spice of Oncology. Not currently on therapy. S/p CBD stent by Dr. Watt Climes on 8/10 -Remote history of colon cancer status post chemotherapy and colectomy -Remote history of prostate cancer status post radiation treatment -HTN -HLD -IDDM  Epigastric Abdominal Pain Possible Acute Cholecystitis  - CTAP showed cholelithiasis with gallbladder wall thickening and pericholecystic fat stranding.  - Right upper quadrant ultrasound showed cholelithiasis and biliary sludge with gallbladder wall thickening and pericholecystic fluid suggestive of acute cholecystitis.   - White blood cell count was elevated at 17.8 on admission. Currently 15.4 - On admission, T bili 2.0, AST 60, ALT 55, alk phos 182. LFT's have been elevated on recent labs. Alk Phos (162) and t bili (1.6) are downtrending this AM, slightly. AST (70) and ALT (57) mildly elevated from admission labs   -Given the patient is newly diagnosed pancreatic cancer with likely metastatic disease to the liver, I do not believe the patient is an operative candidate for laparoscopic cholecystectomy.  I agree with IV antibiotics, Zosyn.  If patient does not improve, would recommend HIDA to definitively determine if patient has acute cholecystitis before placing Perc Chole drain.  We will follow peripherally.   ID - Zosyn 05/20/2019 >>  VTE - SCD, Heparin FEN - CM diet (patient is not eating much, normally would back him down to CLD but I think in this scenario, it is okay to leave on CM diet)   Jillyn Ledger, [facility] Surgery 05/21/2019,  10:06 AM Pager: [phone]

## 2019-05-21 NOTE — Evaluation (Signed)
Occupational Therapy Evaluation Patient Details Name: Damon Snyder. MRN: [id number] DOB: [birth year] Today's Date: 05/21/2019    History of Present Illness 85 y o man was admitted for generalized weakness/fall and unable to get up.  PMH:  pancreatic CA, with probable mets, ERCP, s/p stent, DM, gout with remote h/o colon and prostate CA   Clinical Impression   Pt was admitted for the above.  Pt reports that he sat down a couple of times at home vs. Falling.  He reports he was able to perform ADLs.  Pt now needs min A to stand and up to max A for LB adls. Will follow in acute setting with min guard level goals for mobility so hopefully, he can return home with wife. He reports that he doesn't have any other local family to assist    Follow Up Recommendations  Supervision/Assistance - 24 hour;SNF    Equipment Recommendations  3 in 1 bedside commode    Recommendations for Other Services       Precautions / Restrictions Precautions Precautions: Fall Restrictions Weight Bearing Restrictions: No      Mobility Bed Mobility         Supine to sit: Min assist     General bed mobility comments: HOB raised  Transfers   Equipment used: Rolling walker (2 wheeled)   Sit to Stand: Min assist;From elevated surface Stand pivot transfers: Min assist       General transfer comment: Min A to power up from elevated bed; steadying assistance with assist to control descent when sitting    Balance             Standing balance-Leahy Scale: Poor                             ADL either performed or assessed with clinical judgement   ADL Overall ADL's : Needs assistance/impaired Eating/Feeding: Independent   Grooming: Set up   Upper Body Bathing: Set up   Lower Body Bathing: Moderate assistance   Upper Body Dressing : Minimal assistance   Lower Body Dressing: Maximal assistance   Toilet Transfer: Minimal assistance;Stand-pivot;RW(from elevated surface)    Toileting- Clothing Manipulation and Hygiene: Minimal assistance;Moderate assistance               Vision         Perception     Praxis      Pertinent Vitals/Pain Pain Assessment: No/denies pain     Hand Dominance     Extremity/Trunk Assessment Upper Extremity Assessment Upper Extremity Assessment: Overall WFL for tasks assessed           Communication Communication Communication: (difficult to understand)   Cognition Arousal/Alertness: Awake/alert Behavior During Therapy: WFL for tasks assessed/performed Overall Cognitive Status: Within Functional Limits for tasks assessed                                     General Comments  c/o dizziness:  sitting BP wfls--see vitals    Exercises     Shoulder Instructions      Home Living Family/patient expects to be discharged to:: Private residence Living Arrangements: Spouse/significant other Available Help at Discharge: Family Type of Home: House Home Access: Ramped entrance     Home Layout: One level     Bathroom Shower/Tub: Occupational psychologist: Standard  Home Equipment: [place] - 2 wheels;Cane - single point          Prior Functioning/Environment Level of Independence: Independent with assistive device(s)                 OT Problem List: Decreased strength;Decreased activity tolerance;Impaired balance (sitting and/or standing);Decreased knowledge of use of DME or AE      OT Treatment/Interventions: Self-care/ADL training;Energy conservation;DME and/or AE instruction;Patient/family education;Balance training;Therapeutic activities    OT Goals(Current goals can be found in the care plan section) Acute Rehab OT Goals Patient Stated Goal: to go home OT Goal Formulation: With patient Time For Goal Achievement: 06/04/19 Potential to Achieve Goals: Good ADL Goals Pt Will Transfer to Toilet: with min guard assist;bedside commode Pt Will Perform Toileting -  Clothing Manipulation and hygiene: with min guard assist;sit to/from stand Additional ADL Goal #1: pt will go from sit to stand with min guard assist for adls  OT Frequency: Min 2X/week   Barriers to D/C:            Co-evaluation              AM-PAC OT "6 Clicks" Daily Activity     Outcome Measure Help from another person eating meals?: None Help from another person taking care of personal grooming?: A Little Help from another person toileting, which includes using toliet, bedpan, or urinal?: A Lot Help from another person bathing (including washing, rinsing, drying)?: A Lot Help from another person to put on and taking off regular upper body clothing?: A Little Help from another person to put on and taking off regular lower body clothing?: A Lot 6 Click Score: 16   End of Session    Activity Tolerance: Patient limited by fatigue Patient left: in chair;with call bell/phone within reach;with chair alarm set  OT Visit Diagnosis: Unsteadiness on feet (R26.81);History of falling (Z91.81)                Time: [id number] OT Time Calculation (min): 20 min Charges:  OT General Charges $OT Visit: 1 Visit  Lesle Chris, OTR/L Acute Rehabilitation Services [phone] [facility] pager [phone] office 05/21/2019  [place] 05/21/2019, 1:07 PM

## 2019-05-21 NOTE — Telephone Encounter (Signed)
Returned call from Mrs. Hall Busing. She is concerned about having to take patient back home. I informed her that her husband has been referred to palliative care and they would discuss next steps with her. She plans to be up to see pt later this morning.  Arna Snipe, MS Ed.S, RN  Gastrointestinal Oncology Nurse [facility] [place] Phone: [phone]

## 2019-05-22 LAB — COMPREHENSIVE METABOLIC PANEL
ALT: 51 U/L — ABNORMAL HIGH (ref 0–44)
AST: 51 U/L — ABNORMAL HIGH (ref 15–41)
Albumin: 2.1 g/dL — ABNORMAL LOW (ref 3.5–5.0)
Alkaline Phosphatase: 151 U/L — ABNORMAL HIGH (ref 38–126)
Anion gap: 11 (ref 5–15)
BUN: 46 mg/dL — ABNORMAL HIGH (ref 8–23)
CO2: 22 mmol/L (ref 22–32)
Calcium: 8.1 mg/dL — ABNORMAL LOW (ref 8.9–10.3)
Chloride: 105 mmol/L (ref 98–111)
Creatinine, Ser: 1.63 mg/dL — ABNORMAL HIGH (ref 0.61–1.24)
GFR calc Af Amer: 43 mL/min — ABNORMAL LOW (ref >60)
GFR calc non Af Amer: 37 mL/min — ABNORMAL LOW (ref >60)
Glucose, Bld: 69 mg/dL — ABNORMAL LOW (ref 70–99)
Potassium: 3.4 mmol/L — ABNORMAL LOW (ref 3.5–5.1)
Sodium: 138 mmol/L (ref 135–145)
Total Bilirubin: 1.4 mg/dL — ABNORMAL HIGH (ref 0.3–1.2)
Total Protein: 5.7 g/dL — ABNORMAL LOW (ref 6.5–8.1)

## 2019-05-22 LAB — GLUCOSE, CAPILLARY
Glucose-Capillary: 75 mg/dL (ref 70–99)
Glucose-Capillary: 66 mg/dL — ABNORMAL LOW (ref 70–99)
Glucose-Capillary: 61 mg/dL — ABNORMAL LOW (ref 70–99)
Glucose-Capillary: 71 mg/dL (ref 70–99)
Glucose-Capillary: 80 mg/dL (ref 70–99)
Glucose-Capillary: 113 mg/dL — ABNORMAL HIGH (ref 70–99)
Glucose-Capillary: 106 mg/dL — ABNORMAL HIGH (ref 70–99)
Glucose-Capillary: 83 mg/dL (ref 70–99)
Glucose-Capillary: 63 mg/dL — ABNORMAL LOW (ref 70–99)
Glucose-Capillary: 56 mg/dL — ABNORMAL LOW (ref 70–99)
Glucose-Capillary: 82 mg/dL (ref 70–99)

## 2019-05-22 LAB — CBC
HCT: 27.4 % — ABNORMAL LOW (ref 39.0–52.0)
Hemoglobin: 8.4 g/dL — ABNORMAL LOW (ref 13.0–17.0)
MCH: 28.1 pg (ref 26.0–34.0)
MCHC: 30.7 g/dL (ref 30.0–36.0)
MCV: 91.6 fL (ref 80.0–100.0)
Platelets: 212 10*3/uL (ref 150–400)
RBC: 2.99 MIL/uL — ABNORMAL LOW (ref 4.22–5.81)
RDW: 15.2 % (ref 11.5–15.5)
WBC: 11.3 10*3/uL — ABNORMAL HIGH (ref 4.0–10.5)
nRBC: 0 % (ref 0.0–0.2)

## 2019-05-22 LAB — URINE CULTURE: Culture: NO GROWTH

## 2019-05-22 MED ORDER — DEXTROSE 5 % IV SOLN
INTRAVENOUS | Status: DC
  Administered 2019-05-22 – 2019-05-23 (×3): via INTRAVENOUS

## 2019-05-22 MED ORDER — ENSURE ENLIVE PO LIQD
237.0000 mL | Freq: Two times a day (BID) | ORAL | Status: DC
  Administered 2019-05-22 – 2019-05-23 (×2): 237 mL via ORAL

## 2019-05-22 MED ORDER — BOOST / RESOURCE BREEZE PO LIQD CUSTOM
1.0000 | Freq: Two times a day (BID) | ORAL | Status: DC
  Administered 2019-05-22 – 2019-05-23 (×2): 1 via ORAL

## 2019-05-22 MED ORDER — POTASSIUM CHLORIDE CRYS ER 20 MEQ PO TBCR
40.0000 meq | EXTENDED_RELEASE_TABLET | Freq: Once | ORAL | Status: AC
  Administered 2019-05-22: 40 meq via ORAL
  Filled 2019-05-22: qty 2

## 2019-05-22 MED ORDER — ADULT MULTIVITAMIN W/MINERALS CH
1.0000 | ORAL_TABLET | Freq: Every day | ORAL | Status: DC
  Administered 2019-05-22 – 2019-05-24 (×3): 1 via ORAL
  Filled 2019-05-22 (×3): qty 1

## 2019-05-22 NOTE — Progress Notes (Addendum)
HEMATOLOGY-ONCOLOGY PROGRESS NOTE  SUBJECTIVE: The patient reports that he has not been eating well this morning.  He denies abdominal pain, nausea, vomiting.  He does report that he feels more short of breath.  Having intermittent hiccups.  REVIEW OF SYSTEMS:   Constitutional: Denies fevers and chills Respiratory: Feels more short of breath this morning Cardiovascular: Denies palpitation, chest discomfort Gastrointestinal: Denies abdominal pain, nausea, vomiting, constipation, diarrhea Neurological:Denies numbness, tingling or new weaknesses Behavioral/Psych: Mood is stable, no new changes  Extremities: No lower extremity edema MSK: Reports generalized weakness All other systems were reviewed with the patient and are negative.  I have reviewed the past medical history, past surgical history, social history and family history with the patient and they are unchanged from previous note.   PHYSICAL EXAMINATION:  Vitals:   05/21/19 2203 05/22/19 0408  BP:  (!) 135/56  Pulse: 73 69  Resp:  18  Temp:  98.2 F (36.8 C)  SpO2: 96% 100%   Filed Weights   05/20/19 1238 05/20/19 2015 05/21/19 0425  Weight: 227 lb 1.2 oz (103 kg) 226 lb 13.7 oz (102.9 kg) 226 lb 10.1 oz (102.8 kg)    Intake/Output from previous day: 08/26 0701 - 08/27 0700 In: 2271.5 [P.O.:360; I.V.:1911.5] Out: 1300 [Urine:1300]  GENERAL:alert, appears tachypneic OROPHARYNX: No thrush or mucositis LUNGS: clear to auscultation and percussion with normal breathing effort HEART: regular rate & rhythm and no murmurs and no lower extremity edema ABDOMEN: Positive bowel sounds, soft, mild distention with palpation to the right upper quadrant Musculoskeletal:no cyanosis of digits and no clubbing  NEURO: alert & oriented x 3 with fluent speech, no focal motor/sensory deficits  LABORATORY DATA:  I have reviewed the data as listed CMP Latest Ref Rng & Units 05/22/2019 05/21/2019 05/20/2019  Glucose 70 - 99 mg/dL 69(L) 69(L)  133(H)  BUN 8 - 23 mg/dL 46(H) 57(H) 61(H)  Creatinine 0.61 - 1.24 mg/dL 1.63(H) 2.03(H) 2.59(H)  Sodium 135 - 145 mmol/L 138 137 135  Potassium 3.5 - 5.1 mmol/L 3.4(L) 4.0 3.6  Chloride 98 - 111 mmol/L 105 104 97(L)  CO2 22 - 32 mmol/L 22 22 22   Calcium 8.9 - 10.3 mg/dL 8.1(L) 8.4(L) 8.6(L)  Total Protein 6.5 - 8.1 g/dL 5.7(L) 6.4(L) 6.8  Total Bilirubin 0.3 - 1.2 mg/dL 1.4(H) 1.6(H) 2.0(H)  Alkaline Phos 38 - 126 U/L 151(H) 162(H) 182(H)  AST 15 - 41 U/L 51(H) 70(H) 60(H)  ALT 0 - 44 U/L 51(H) 57(H) 55(H)    Lab Results  Component Value Date   WBC 11.3 (H) 05/22/2019   HGB 8.4 (L) 05/22/2019   HCT 27.4 (L) 05/22/2019   MCV 91.6 05/22/2019   PLT 212 05/22/2019   NEUTROABS 13.2 (H) 05/21/2019    Ct Abdomen Pelvis Wo Contrast  Result Date: 05/20/2019 CLINICAL DATA:  Nausea and vomiting. Left upper quadrant abdominal pain. History of recently diagnosed pancreatic cancer. Generalized weakness. Loss of appetite. EXAM: CT ABDOMEN AND PELVIS WITHOUT CONTRAST TECHNIQUE: Multidetector CT imaging of the abdomen and pelvis was performed following the standard protocol without IV contrast. COMPARISON:  05/04/2019 MRI abdomen.  05/03/2019 CT abdomen/pelvis. FINDINGS: Lower chest: Left lower lobe 6 mm pulmonary nodule (series 6/image 35), increased from 4 mm on 05/03/2019 CT. Right middle lobe peripheral 4 mm pulmonary nodule (series 6/image 4), not previously imaged. Mild-to-moderate bibasilar atelectasis. Coronary atherosclerosis. Low cardiac blood pool density indicates anemia. Hepatobiliary: Normal liver size. Numerous (greater than 8) small hypodense liver masses scattered throughout the liver, mildly  increased since 05/03/2019 CT. Representative posterior right upper lobe 1.6 cm hypodense mass (series 2/image 12), increased from 1.2 cm. Lateral segment left liver lobe 1.2 cm mass (series 2/image 22), increased from 0.8 cm. Tiny layering calcified gallstones within the mildly distended  gallbladder. New mild diffuse gallbladder wall thickening and pericholecystic fat haziness. Scattered pneumobilia within the gallbladder and intrahepatic bile ducts. Well-positioned CBD stent terminating in the duodenal lumen. Pancreas: Known ill-defined pancreatic head mass measuring approximately 4.7 cm (series 2/image 34), not appreciably changed. Stable parenchymal atrophy and duct dilation throughout the pancreatic body and tail. Spleen: Normal size. No mass. Adrenals/Urinary Tract: Stable 1.0 cm right adrenal adenoma with density 1 HU. No discrete left adrenal nodules. No renal stones. No hydronephrosis. Previously described indeterminate renal cortical lesion in the upper left kidney on 05/04/2019 MRI is non contour deforming and occult on this noncontrast CT. Bilateral simple renal cysts, largest 4.3 cm in the lateral interpolar right kidney. Normal bladder. Stomach/Bowel: Normal non-distended stomach. Normal caliber small bowel with no small bowel wall thickening. Postsurgical changes from subtotal right hemicolectomy with ileocolic anastomosis in the right abdomen. No large-bowel wall thickening, significant diverticulosis or significant pericolonic fat stranding. Vascular/Lymphatic: Atherosclerotic nonaneurysmal abdominal aorta. Stable mildly enlarged 1.1 cm porta hepatis node (series 2/image 29). No additional pathologically enlarged abdominopelvic nodes. Reproductive: Top-normal size prostate. Other: No pneumoperitoneum, ascites or focal fluid collection. Musculoskeletal: No aggressive appearing focal osseous lesions. Marked thoracolumbar spondylosis. IMPRESSION: 1. Known pancreatic head malignancy is unchanged. Well-positioned CBD stent with pneumobilia in the intrahepatic bile ducts indicating stent patency. 2. Cholelithiasis. New diffuse gallbladder wall thickening and pericholecystic fat stranding. Acute cholecystitis cannot be excluded. 3. Diffuse liver metastases have mildly increased in size. 4.  Bilateral pulmonary nodules at the lung bases, mildly increased in size at the left lung base, worrisome for pulmonary metastases. 5. Stable mild porta hepatis adenopathy. 6. Stable right adrenal adenoma. 7. No evidence of bowel obstruction or acute bowel inflammation. 8.  Aortic Atherosclerosis (ICD10-I70.0). Electronically Signed   By: Ilona Sorrel M.D.   On: 05/20/2019 16:04   Ct Abdomen Pelvis Wo Contrast  Result Date: 05/03/2019 CLINICAL DATA:  Transaminitis EXAM: CT ABDOMEN AND PELVIS WITHOUT CONTRAST TECHNIQUE: Multidetector CT imaging of the abdomen and pelvis was performed following the standard protocol without IV contrast. COMPARISON:  None. FINDINGS: Lower chest: There is a partially visualized 6 mm pulmonary nodule in the left lower lobe (axial series 3, image 1). There is an additional opacity in the left lower lobe measuring approximately 7 mm (axial series 3, image 9). There is mucous plugging and bronchiectasis involving the medial right lung base.The heart size is normal. Hepatobiliary: There are multiple indeterminate hypoattenuating masses in the liver most notably within the right hepatic lobe. These measure up to approximately 1.6 cm. Multiple gallstones versus gallbladder sludge is noted. The common bile duct is mildly dilated measuring up to approximately 9 mm proximally. There appears to be some intrahepatic biliary ductal dilatation. Pancreas: There is an ill-defined soft tissue density at the pancreatic head. This is not well characterized in the absence of IV contrast. This measures approximately 3.5 x 3.8 cm (axial series 3, image 30). Spleen: No splenic laceration or hematoma. Adrenals/Urinary Tract: --Adrenal glands: No adrenal hemorrhage. --Right kidney/ureter: Multiple right-sided renal cysts are noted. There is no hydronephrosis. --Left kidney/ureter: No hydronephrosis or perinephric hematoma. --Urinary bladder: Unremarkable. Stomach/Bowel: --Stomach/Duodenum: There is a small  hiatal hernia. --Small bowel: No dilatation or inflammation. --Colon: The patient is status  post partial colectomy. A surgical anastomosis is noted at the level of the hepatic flexure. There is no evidence for an obstruction. --Appendix: The appendix is likely surgically absent. Vascular/Lymphatic: Atherosclerotic calcification is present within the non-aneurysmal abdominal aorta, without hemodynamically significant stenosis. --No retroperitoneal lymphadenopathy. --No mesenteric lymphadenopathy. --No pelvic or inguinal lymphadenopathy. Reproductive: Unremarkable Other: No ascites or free air. There is a right fat containing inguinal hernia. Musculoskeletal. There is a somewhat irregular appearance of the inferior endplate of the L3 vertebral body. This is favored to represent an atypical Schmorl's node. Degenerative changes are noted throughout the visualized lumbar spine. There is grade 1 anterolisthesis of L4 on L5. There is no acute osseous abnormality. IMPRESSION: 1. Distended gallbladder with evidence of gallbladder sludge versus cholelithiasis. There is probable intrahepatic and extrahepatic biliary ductal dilatation which is not well evaluated in the absence of IV contrast. If there is clinical concern for an obstructing process such as choledocholithiasis, follow-up with MRCP/ERCP is recommended. There is no CT evidence for acute cholecystitis. 2. Multiple small hypoattenuating masses throughout the liver as detailed above. These are incompletely characterized on this exam. Follow-up with a contrast-enhanced MRI is recommended. 3. Somewhat ill-defined soft tissue density at the level of the pancreatic head. This is not well evaluated in the absence of IV contrast. Follow-up with a contrast-enhanced MRI is recommended. 4. Status post partial colectomy.  No evidence of an obstruction. 5. Multiple pulmonary nodules in the left lower lobe measuring up to approximately 7 mm. Non-contrast chest CT at 3-6 months is  recommended. If the nodules are stable at time of repeat CT, then future CT at 18-24 months (from today's scan) is considered optional for low-risk patients, but is recommended for high-risk patients. This recommendation follows the consensus statement: Guidelines for Management of Incidental Pulmonary Nodules Detected on CT Images: From the Fleischner Society 2017; Radiology 2017; 284:228-243. Electronically Signed   By: Constance Holster M.D.   On: 05/03/2019 18:27   Dg Chest 2 View  Result Date: 05/03/2019 CLINICAL DATA:  Fall. EXAM: CHEST - 2 VIEW COMPARISON:  August 13, 2015 FINDINGS: The heart size and mediastinal contours are within normal limits. Both lungs are clear. The visualized skeletal structures are unremarkable. IMPRESSION: No active cardiopulmonary disease. Electronically Signed   By: Dorise Bullion III M.D   On: 05/03/2019 14:13   Ct Head Wo Contrast  Result Date: 05/03/2019 CLINICAL DATA:  Weakness with recent fall.  No known head injury. EXAM: CT HEAD WITHOUT CONTRAST TECHNIQUE: Contiguous axial images were obtained from the base of the skull through the vertex without intravenous contrast. COMPARISON:  MRI brain 09/22/2013. FINDINGS: Brain: There is no evidence of acute intracranial hemorrhage, mass lesion, brain edema or extra-axial fluid collection. There is stable atrophy with mild prominence of the ventricles and subarachnoid spaces. There are mild chronic small vessel ischemic changes within the periventricular white matter which are similar to previous MRI. There is no CT evidence of acute cortical infarction. Vascular: Prominent intracranial vascular calcifications. No hyperdense vessel identified. Skull: Negative for fracture or focal lesion. Sinuses/Orbits: The visualized paranasal sinuses and mastoid air cells are clear. No orbital abnormalities are seen. Other: None. IMPRESSION: Stable atrophy and mild chronic small vessel ischemic changes. No acute intracranial findings.  Electronically Signed   By: Richardean Sale M.D.   On: 05/03/2019 15:58   Mr 3d Recon At Scanner  Result Date: 05/04/2019 CLINICAL DATA:  Abnormal liver function tests. CT demonstrating possible pancreatic and liver lesions. EXAM: MRI  ABDOMEN WITHOUT AND WITH CONTRAST (INCLUDING MRCP) TECHNIQUE: Multiplanar multisequence MR imaging of the abdomen was performed both before and after the administration of intravenous contrast. Heavily T2-weighted images of the biliary and pancreatic ducts were obtained, and three-dimensional MRCP images were rendered by post processing. CONTRAST:  10 cc Gadavist COMPARISON:  CT of 1 day prior FINDINGS: Lower chest: Normal heart size without pericardial or pleural effusion. Tiny hiatal hernia. Left lower lobe pulmonary nodules including on series 26 are not well evaluated. Hepatobiliary: Bilateral liver lesions which are most consistent with metastasis. Example in the left hepatic lobe at 1.0 cm on 44/20. In the posterior right hepatic lobe at 11 mm on image 35/20. Small gallstones including on 24/6. Gallbladder distension but no specific evidence of acute cholecystitis. Mild intrahepatic biliary duct dilatation. The common duct measures 12 mm on 34/12. No choledocholithiasis. Pancreas: Atrophy with main duct and side branch dilatation throughout the body, neck, tail. This continues to the level of a pancreatic head mass which measures 4.4 x 4.0 cm on image 66/20. No superimposed pancreatitis. Spleen:  Normal in size, without focal abnormality. Adrenals/Urinary Tract: Normal left adrenal gland. Minimal right adrenal nodularity. This consistent with an adenoma of 8 mm on 46/9. Bilateral renal cysts. An upper pole left renal lesion measures 1.8 cm and demonstrates precontrast heterogeneous T1 signal. Equivocal post-contrast enhancement, including on subtracted image 54/25. Stomach/Bowel: Normal distal stomach and abdominal bowel loops. Vascular/Lymphatic: Aortic atherosclerosis. There  is involvement of the superior mesenteric vein and splenoportal confluence by tumor, including on 65/20. No abdominal adenopathy. Other:  No ascites.  No evidence of omental or peritoneal disease. Musculoskeletal: No acute osseous abnormality. IMPRESSION: 1. Pancreatic head mass is consistent with adenocarcinoma. This causes biliary and pancreatic duct dilatation. 2. Bilateral hepatic metastasis. 3. Cholelithiasis without evidence of acute cholecystitis. 4. Upper pole left renal lesion is indeterminate and could represent a complex cyst or a mildly enhancing renal cell carcinoma. Of questionable clinical significance given age and comorbidities. 5. Left lung base nodules are suboptimally evaluated. If complete staging is desired, consider chest CT. 6. Right adrenal adenoma. Electronically Signed   By: Abigail Miyamoto M.D.   On: 05/04/2019 18:14   Dg Chest Port 1 View  Result Date: 05/20/2019 CLINICAL DATA:  Weakness, fall EXAM: PORTABLE CHEST 1 VIEW COMPARISON:  Radiograph May 03, 2019 FINDINGS: Bandlike areas of scarring and/or subsegmental atelectasis seen in both bases, right greater than left. Central vascular crowding. No consolidation, features of edema, pneumothorax, or effusion. Pulmonary vascularity is normally distributed. The aorta is calcified. The remaining cardiomediastinal contours are unremarkable. No acute osseous or soft tissue abnormality. Degenerative changes are present in the imaged spine and shoulders. IMPRESSION: Basilar atelectasis and/or scarring. Otherwise no acute cardiopulmonary or traumatic abnormality Electronically Signed   By: Lovena Le M.D.   On: 05/20/2019 18:22   Dg Knee Complete 4 Views Left  Result Date: 05/03/2019 CLINICAL DATA:  Pain after fall EXAM: LEFT KNEE - COMPLETE 4+ VIEW COMPARISON:  None. FINDINGS: Patient is status post left knee replacement. Hardware is in good position. No fracture or dislocation identified. No effusions. Vascular calcifications noted.  IMPRESSION: No acute abnormalities. Electronically Signed   By: Dorise Bullion III M.D   On: 05/03/2019 14:14   Dg Ercp Biliary & Pancreatic Ducts  Result Date: 05/05/2019 CLINICAL DATA:  Obstructive jaundice EXAM: ERCP TECHNIQUE: Multiple spot images obtained with the fluoroscopic device and submitted for interpretation post-procedure. COMPARISON:  MRCP from the previous day FINDINGS: Series  of fluoroscopic spot images document endoscopic cannulation and opacification of the CBD. A plastic pancreatic duct stent is noted. There is an occluded segment in the mid CBD, mildly distended proximally, decompressed distally. Limited opacification of the intrahepatic biliary tree. Subsequent images document placement of a metallic biliary stent across the lesion. IMPRESSION: Endoscopic CBD cannulation and intervention with metallic stent placement. These images were submitted for radiologic interpretation only. Please see the procedural report for the amount of contrast and the fluoroscopy time utilized. Electronically Signed   By: Lucrezia Europe M.D.   On: 05/05/2019 15:13   Dg Abd 2 Views  Result Date: 05/12/2019 CLINICAL DATA:  Follow-up stent placement EXAM: ABDOMEN - 2 VIEW COMPARISON:  [id number] FINDINGS: Metallic biliary stent is again identified with central narrowing similar to the known underlying mass lesion. The plastic pancreatic stent is no longer identified and has likely passed. Degenerative changes of lumbar spine are seen. No free air is noted. Nonobstructive bowel gas pattern is seen. IMPRESSION: Metallic biliary stent remains in place. Previously placed pancreatic stent has passed. Electronically Signed   By: Inez Catalina M.D.   On: 05/12/2019 12:20   Mr Abdomen Mrcp Moise Boring Contast  Result Date: 05/04/2019 CLINICAL DATA:  Abnormal liver function tests. CT demonstrating possible pancreatic and liver lesions. EXAM: MRI ABDOMEN WITHOUT AND WITH CONTRAST (INCLUDING MRCP) TECHNIQUE: Multiplanar  multisequence MR imaging of the abdomen was performed both before and after the administration of intravenous contrast. Heavily T2-weighted images of the biliary and pancreatic ducts were obtained, and three-dimensional MRCP images were rendered by post processing. CONTRAST:  10 cc Gadavist COMPARISON:  CT of 1 day prior FINDINGS: Lower chest: Normal heart size without pericardial or pleural effusion. Tiny hiatal hernia. Left lower lobe pulmonary nodules including on series 26 are not well evaluated. Hepatobiliary: Bilateral liver lesions which are most consistent with metastasis. Example in the left hepatic lobe at 1.0 cm on 44/20. In the posterior right hepatic lobe at 11 mm on image 35/20. Small gallstones including on 24/6. Gallbladder distension but no specific evidence of acute cholecystitis. Mild intrahepatic biliary duct dilatation. The common duct measures 12 mm on 34/12. No choledocholithiasis. Pancreas: Atrophy with main duct and side branch dilatation throughout the body, neck, tail. This continues to the level of a pancreatic head mass which measures 4.4 x 4.0 cm on image 66/20. No superimposed pancreatitis. Spleen:  Normal in size, without focal abnormality. Adrenals/Urinary Tract: Normal left adrenal gland. Minimal right adrenal nodularity. This consistent with an adenoma of 8 mm on 46/9. Bilateral renal cysts. An upper pole left renal lesion measures 1.8 cm and demonstrates precontrast heterogeneous T1 signal. Equivocal post-contrast enhancement, including on subtracted image 54/25. Stomach/Bowel: Normal distal stomach and abdominal bowel loops. Vascular/Lymphatic: Aortic atherosclerosis. There is involvement of the superior mesenteric vein and splenoportal confluence by tumor, including on 65/20. No abdominal adenopathy. Other:  No ascites.  No evidence of omental or peritoneal disease. Musculoskeletal: No acute osseous abnormality. IMPRESSION: 1. Pancreatic head mass is consistent with  adenocarcinoma. This causes biliary and pancreatic duct dilatation. 2. Bilateral hepatic metastasis. 3. Cholelithiasis without evidence of acute cholecystitis. 4. Upper pole left renal lesion is indeterminate and could represent a complex cyst or a mildly enhancing renal cell carcinoma. Of questionable clinical significance given age and comorbidities. 5. Left lung base nodules are suboptimally evaluated. If complete staging is desired, consider chest CT. 6. Right adrenal adenoma. Electronically Signed   By: Abigail Miyamoto M.D.   On: 05/04/2019  18:14   Dg Hip Unilat W Or Wo Pelvis 2-3 Views Left  Result Date: 05/03/2019 CLINICAL DATA:  Left hip pain post fall. EXAM: DG HIP (WITH OR WITHOUT PELVIS) 2-3V LEFT COMPARISON:  None. FINDINGS: There is no evidence of hip fracture or dislocation. Mild osteoarthritic changes of bilateral hips. Vascular calcifications noted. IMPRESSION: No acute fracture or dislocation identified about the left hip. Electronically Signed   By: Fidela Salisbury M.D.   On: 05/03/2019 14:14   US Abdomen Limited Ruq  Result Date: 05/20/2019 CLINICAL DATA:  Cholecystitis EXAM: ULTRASOUND ABDOMEN LIMITED RIGHT UPPER QUADRANT COMPARISON:  Same day CT FINDINGS: Gallbladder: There is a mixture of biliary sludge and echogenic shadowing gallstones with several hyperechogenic, dirty shadowing foci of intraluminal gas. There is a thickened echogenic gallbladder wall measuring up to 8.4 mm. Small volume of pericholecystic fluid is present.Sonographic Percell Miller sign is reportedly negative. Common bile duct: Diameter: 5.2 mm, not dilated. Biliary stent present in the proximal common bile duct. Liver: Difficult visualization of entire liver parenchyma due to overlying bowel gas. Several echogenic shadowing foci within the liver are likely reflective the pneumatosis seen on prior CT. Portal vein is patent on color Doppler imaging with normal direction of blood flow towards the liver. Other: None.  IMPRESSION: Biliary stent in place with mild intrahepatic biliary ductal dilatation and pneumatosis. Cholelithiasis and biliary sludge in with foci of intraluminal gas. Wall thickening and pericholecystic fluid are suggestive of acute cholecystitis though sonographic Percell Miller sign is reportedly negative. Could correlate for history of analgesia which may limit the diagnostic utility of the sonographic Murphy sign. Electronically Signed   By: Lovena Le M.D.   On: 05/20/2019 20:08    ASSESSMENT AND PLAN: 1. Pancreas cancer ? Pancreas head mass, biliary ductal dilatation, left lung nodules, and liver lesions noted on a CT abdomen/pelvis 05/03/2019 ? MRI abdomen 05/04/2019- pancreas mass, biliary and pancreatic duct dilatation, bilateral hepatic metastases, indeterminate left upper pole renal lesion, left lung base nodules ? ERCP 05/05/2019- duct stricture, placement of a common bile duct stent and duct brushings ? Cytology brushings 05/05/2019-"suspicious "for malignancy 2. Anorexia/weight loss secondary to #1 3.   Pain secondary to #1 4.   Nausea 5.  Diabetes 6.  Right colon cancer, stage III (T4N2, N0) 2003, status post adjuvant chemotherapy 7.  Prostate cancer 8.  05/20/2019-hospital admission for generalized weakness, abdominal discomfort, questionable acute cholecystitis  Damon Snyder appears tachypneic this morning.  Oxygen saturations remain normal however. He is not currently having abdominal discomfort or any other GI symptoms this morning.  1.  An order for oxygen has been placed for comfort.   2.  Spoke with palliative care team this morning who plans to meet with the patient and his wife later today.  Again recommend home hospice versus placement at either a skilled nursing facility with palliative care/hospice versus [facility] if he continues to decline. 3.  Continue medical management of cholecystitis.  If no improvement, could consider a percutaneous chole drain.   LOS: 1 day   Mikey Bussing, DNP, AGPCNP-BC, AOCNP 05/22/19 Damon Snyder has hiccups this morning.  He is undergoing an evaluation for hospice care at home placement with hospice. I would discontinue CBG checks if the blood sugar is not significantly elevated today.

## 2019-05-22 NOTE — Evaluation (Signed)
Physical Therapy Evaluation Patient Details Name: Damon Snyder. MRN: [id number] DOB: 12-04-1927 Today's Date: 05/22/2019   History of Present Illness  85 y o man was admitted for generalized weakness/fall and unable to get up and abdominal pain with acute cholocystitis.  PMH:  pancreatic CA, with probable mets, ERCP, s/p stent, DM, gout with remote h/o colon and prostate CA  Clinical Impression  Pt tolerated session well was excited to be up and walking again. I am unsure of his wife's ability to care for pt at home, He is walking with supervision/Min guard with RW at this time and per patient sounded like HHPT and potentially other services were coming to the home to assist with strengthening. I recommend this continue if wife and pt agree. Will continue to follow while here and encouraged nursing to walk pt to bathroom with RW for daily mobility.     Follow Up Recommendations Home health PT;Supervision for mobility/OOB(if wife is able to give supervision 24/7 and PRN minA at this time)    Equipment Recommendations       Recommendations for Other Services       Precautions / Restrictions Precautions Precautions: Fall Restrictions Weight Bearing Restrictions: No      Mobility  Bed Mobility Overal bed mobility: Needs Assistance Bed Mobility: Supine to Sit;Sit to Supine     Supine to sit: Supervision     General bed mobility comments: HOB raised  Transfers   Equipment used: Rolling walker (2 wheeled)   Sit to Stand: From elevated surface;Min guard(from elevated bed due to pt is 6'4") Stand pivot transfers: Min guard       General transfer comment: Min A to power up from elevated bed; steadying assistance with assist to control descent when sitting  Ambulation/Gait Ambulation/Gait assistance: Min guard Gait Distance (Feet): 20 Feet Assistive device: Rolling walker (2 wheeled) Gait Pattern/deviations: Step-through pattern;Decreased stride length     General Gait  Details: slow and steady, just hadn't been out of bed for a while so limited it to in room walking. Seemed to easily be able to progress walking into hallwya with Min guard and increased distance, passed along to nursing to encourage ambulation with RW to /from bathroom with suerpvision as well.  Stairs            Wheelchair Mobility    Modified Rankin (Stroke Patients Only)       Balance Overall balance assessment: Needs assistance Sitting-balance support: No upper extremity supported;Feet supported Sitting balance-Leahy Scale: Good     Standing balance support: Bilateral upper extremity supported;During functional activity Standing balance-Leahy Scale: Fair Standing balance comment: Reliant on BUE support                              Pertinent Vitals/Pain Pain Assessment: Faces Faces Pain Scale: No hurt    Home Living Family/patient expects to be discharged to:: Private residence Living Arrangements: Spouse/significant other Available Help at Discharge: Family Type of Home: House Home Access: Ramped entrance     Home Layout: One level Home Equipment: Environmental consultant - 2 wheels;Cane - single point;Bedside commode;Wheelchair - manual      Prior Function Level of Independence: Independent with assistive device(s)         Comments: pt states he uses RW in the home and [facility] out of the house for appointments. He has used the RW a little less lately due to his fall nad both him  and his wife were a little fearful. Pt stated and sounded like he did have HHPT and [place] services coming in.     Hand Dominance        Extremity/Trunk Assessment        Lower Extremity Assessment Lower Extremity Assessment: Generalized weakness       Communication   Communication: No difficulties  Cognition Arousal/Alertness: Awake/alert Behavior During Therapy: WFL for tasks assessed/performed Overall Cognitive Status: Within Functional Limits for tasks assessed                                         General Comments      Exercises     Assessment/Plan    PT Assessment Patient needs continued PT services  PT Problem List Decreased strength;Decreased balance;Decreased mobility;Decreased knowledge of use of DME       PT Treatment Interventions DME instruction;Gait training;Functional mobility training;Therapeutic exercise;Therapeutic activities;Balance training;Patient/family education    PT Goals (Current goals can be found in the Care Plan section)  Acute Rehab PT Goals Patient Stated Goal: to go home PT Goal Formulation: With patient Time For Goal Achievement: 06/06/19 Potential to Achieve Goals: Fair    Frequency Min 3X/week   Barriers to discharge        Co-evaluation               AM-PAC PT "6 Clicks" Mobility  Outcome Measure Help needed turning from your back to your side while in a flat bed without using bedrails?: None Help needed moving from lying on your back to sitting on the side of a flat bed without using bedrails?: A Little Help needed moving to and from a bed to a chair (including a wheelchair)?: A Little Help needed standing up from a chair using your arms (e.g., wheelchair or bedside chair)?: A Little Help needed to walk in hospital room?: A Little Help needed climbing 3-5 steps with a railing? : A Little 6 Click Score: 19    End of Session Equipment Utilized During Treatment: Gait belt Activity Tolerance: Patient tolerated treatment well Patient left: in bed;with call bell/phone within reach;with bed alarm set;with family/visitor present Nurse Communication: Mobility status PT Visit Diagnosis: History of falling (Z91.81);Muscle weakness (generalized) (M62.81)    Time: [id number] PT Time Calculation (min) (ACUTE ONLY): 27 min   Charges:   PT Evaluation $PT Eval Low Complexity: 1 Low PT Treatments $Gait Training: 8-22 mins        Clide Dales, PT Acute Rehabilitation Services Pager:  [phone] Office: [phone] 05/22/2019   Clide Dales 05/22/2019, 1:52 PM

## 2019-05-22 NOTE — Progress Notes (Signed)
Initial Nutrition Assessment  RD working remotely.   DOCUMENTATION CODES:   (unable to assess for malnutrition at this time.)  INTERVENTION:  - will order Boost Breeze BID, each supplement provides 250 kcal and 9 grams of protein. - will order Ensure Enlive BID, each supplement provides 350 kcal and 20 grams of protein. - continue to encourage PO intakes.    NUTRITION DIAGNOSIS:   Increased nutrient needs related to catabolic illness, cancer and cancer related treatments as evidenced by estimated needs.  GOAL:   Patient will meet greater than or equal to 90% of their needs  MONITOR:   PO intake, Supplement acceptance, Labs, Weight trends  REASON FOR ASSESSMENT:   Malnutrition Screening Tool  ASSESSMENT:   85 y.o. male with medical history significant of pancreatic cancer with probable mets to liver and lungs, recently diagnosed per ERCP on 8/10 s/p CBD stent placement, HTN, hx of colon cancer s/p chemotherapy, hx prostate cancer s/p radiation treatment, insulin-dependent type 2 DM, gout, hyperlipidemia, anemia, and arthritis. He presented to the ED on 8/25 d/t sudden onset of generalized weakness and dizziness. He experienced a fall with inability to get up on 8/25. He reported some fevers, chills, episode of N/V the night PTA, SOB, constipation, and lightheadedness PTA.  Per RN flow sheet, patient consumed 0% of breakfast and lunch and 5% of dinner yesterday; 0% of breakfast this AM. Patient reports ongoing poor appetite PTA and that it has been difficult for him to consume more than a few bites at a time.   Per chart review, current weight is 226 lb and weight has been stable for the past 2 months. Weight on 03/10/19 was 236 lb. This indicates 10 lb weight loss (4% body weight) in the past 2.5 months; not significant for time frame.   Suspect some degree of malnutrition, but unable to assess at this time. Palliative Care following and indicates that prognosis is <6 months.    Per notes: - generalized weakness 2/2 dehydration 2/2 metastatic pancreatic cancer  - probable acute cholecystitis--general surgery following and recommending abx and if no improvement then possible perc drain in the future - AKI on CKD stage 3 - transaminitis  - recently diagnosed with pancreatic cancer with probable lung and liver mets--patient does not wish for chemo or other treatment and the goal is hospice - normocytic anemia   Labs reviewed; CBGs: 61-113 mg/dl today, K: 3.4 mmol/l, BUN: 46 mg/dl, creatinine: 1.63 mg/dl, Ca: 8.1 mg/dl, alk phos elevated, LFTs slightly elevated, GFR: 43 ml/min. Medications reviewed; sliding scale novolog, 40 mEq K-Dur x1 dose 8/27, 1 tablet senokot BID. IVF; NS @ 100 ml/hr.     NUTRITION - FOCUSED PHYSICAL EXAM:  unable to complete at this time.   Diet Order:   Diet Order            Diet Carb Modified Fluid consistency: Thin; Room service appropriate? Yes  Diet effective now              EDUCATION NEEDS:   No education needs have been identified at this time  Skin:  Skin Assessment: Reviewed RN Assessment  Last BM:  8/27  Height:   Ht Readings from Last 1 Encounters:  05/20/19 '6\' 4"'$  (1.93 m)    Weight:   Wt Readings from Last 1 Encounters:  05/21/19 102.8 kg    Ideal Body Weight:  91.8 kg  BMI:  Body mass index is 27.59 kg/m.  Estimated Nutritional Needs:   Kcal:  8921-1941  kcal  Protein:  120-130 grams  Fluid:  >/= 2.3 L/day      Jarome Matin, MS, RD, LDN, [facility] Inpatient Clinical Dietitian Pager # [phone] After hours/weekend pager # [phone]

## 2019-05-22 NOTE — Progress Notes (Signed)
CRITICAL VALUE ALERT  Critical Value:  FSBS 63. OJ given. Rechecked and 56. More food given . Asymptomatic. Report poor appetite. No IVF ordered. Adm generalized weakness. Hx pancreatic CA   Date & Time Notied:  05-22-2019 @ 2120  Provider Notified: Kennon Holter, NP  Orders Received/Actions taken: Awaiting orders

## 2019-05-22 NOTE — Progress Notes (Signed)
Hypoglycemic Event  CBG: 66 Treatment:  4 oz regular soda   Symptoms:  N/A  Follow-up CBG: Time 0440 CBG Result: 61  Possible Reasons for Event:  patient has poor appetite and not eating/drinking  Comments/MD notified; 4 oz juice given for blood glucose of 61.  Rechecked in 15 min and now BG 71.  B Kyre notified.  Order to give patient more juice and recheck BG in 15 min   Robley Fries

## 2019-05-22 NOTE — Progress Notes (Signed)
OT Cancellation Note  Patient Details Name: Damon Snyder. MRN: [id number] DOB: 01-Feb-1928   Cancelled Treatment:    Reason Eval/Treat Not Completed: Patient declined, no reason specified Pt did not feel like getting OOB this am.   Sharolyn Weber 05/22/2019, 9:10 AM  Lesle Chris, OTR/L Acute Rehabilitation Services [phone] [facility] pager [phone] office 05/22/2019

## 2019-05-22 NOTE — Progress Notes (Signed)
Subjective: CC: Abdominal pain Patient notes that he did not eat much solid food yesterday. He had some liquids. He isn't sure if this made his abdominal pain any worse. He met with medical oncology and palliative care yesterday.   Objective: Vital signs in last 24 hours: Temp:  [97.9 F (36.6 C)-98.8 F (37.1 C)] 98.2 F (36.8 C) (08/27 0408) Pulse Rate:  [69-73] 69 (08/27 0408) Resp:  [18-20] 18 (08/27 0408) BP: (133-135)/(54-56) 135/56 (08/27 0408) SpO2:  [96 %-100 %] 100 % (08/27 0408) Last BM Date: 05/09/19(per pt, states this is normal)  Intake/Output from previous day: 08/26 0701 - 08/27 0700 In: 2271.5 [P.O.:360; I.V.:1911.5] Out: 1300 [Urine:1300] Intake/Output this shift: No intake/output data recorded.  PE: Gen: frail elderly male who is laying in bed in NAD HEENT: No icterus Lungs: Normal rate and effort Abd: Soft, mildly distended with tenderness in the RUQ and LUQ without r/r/g. +BS  Lab Results:  Recent Labs    05/21/19 0435 05/22/19 0340  WBC 15.4* 11.3*  HGB 9.1* 8.4*  HCT 29.2* 27.4*  PLT 223 212   BMET Recent Labs    05/21/19 0435 05/22/19 0340  NA 137 138  K 4.0 3.4*  CL 104 105  CO2 22 22  GLUCOSE 69* 69*  BUN 57* 46*  CREATININE 2.03* 1.63*  CALCIUM 8.4* 8.1*   PT/INR No results for input(s): LABPROT, INR in the last 72 hours. CMP     Component Value Date/Time   NA 138 05/22/2019 0340   K 3.4 (L) 05/22/2019 0340   CL 105 05/22/2019 0340   CO2 22 05/22/2019 0340   GLUCOSE 69 (L) 05/22/2019 0340   BUN 46 (H) 05/22/2019 0340   CREATININE 1.63 (H) 05/22/2019 0340   CALCIUM 8.1 (L) 05/22/2019 0340   PROT 5.7 (L) 05/22/2019 0340   ALBUMIN 2.1 (L) 05/22/2019 0340   AST 51 (H) 05/22/2019 0340   ALT 51 (H) 05/22/2019 0340   ALKPHOS 151 (H) 05/22/2019 0340   BILITOT 1.4 (H) 05/22/2019 0340   GFRNONAA 37 (L) 05/22/2019 0340   GFRAA 43 (L) 05/22/2019 0340   Lipase     Component Value Date/Time   LIPASE 125 (H)  05/04/2019 0255       Studies/Results: Ct Abdomen Pelvis Wo Contrast  Result Date: 05/20/2019 CLINICAL DATA:  Nausea and vomiting. Left upper quadrant abdominal pain. History of recently diagnosed pancreatic cancer. Generalized weakness. Loss of appetite. EXAM: CT ABDOMEN AND PELVIS WITHOUT CONTRAST TECHNIQUE: Multidetector CT imaging of the abdomen and pelvis was performed following the standard protocol without IV contrast. COMPARISON:  05/04/2019 MRI abdomen.  05/03/2019 CT abdomen/pelvis. FINDINGS: Lower chest: Left lower lobe 6 mm pulmonary nodule (series 6/image 35), increased from 4 mm on 05/03/2019 CT. Right middle lobe peripheral 4 mm pulmonary nodule (series 6/image 4), not previously imaged. Mild-to-moderate bibasilar atelectasis. Coronary atherosclerosis. Low cardiac blood pool density indicates anemia. Hepatobiliary: Normal liver size. Numerous (greater than 8) small hypodense liver masses scattered throughout the liver, mildly increased since 05/03/2019 CT. Representative posterior right upper lobe 1.6 cm hypodense mass (series 2/image 12), increased from 1.2 cm. Lateral segment left liver lobe 1.2 cm mass (series 2/image 22), increased from 0.8 cm. Tiny layering calcified gallstones within the mildly distended gallbladder. New mild diffuse gallbladder wall thickening and pericholecystic fat haziness. Scattered pneumobilia within the gallbladder and intrahepatic bile ducts. Well-positioned CBD stent terminating in the duodenal lumen. Pancreas: Known ill-defined pancreatic head mass measuring approximately 4.7  cm (series 2/image 34), not appreciably changed. Stable parenchymal atrophy and duct dilation throughout the pancreatic body and tail. Spleen: Normal size. No mass. Adrenals/Urinary Tract: Stable 1.0 cm right adrenal adenoma with density 1 HU. No discrete left adrenal nodules. No renal stones. No hydronephrosis. Previously described indeterminate renal cortical lesion in the upper left  kidney on 05/04/2019 MRI is non contour deforming and occult on this noncontrast CT. Bilateral simple renal cysts, largest 4.3 cm in the lateral interpolar right kidney. Normal bladder. Stomach/Bowel: Normal non-distended stomach. Normal caliber small bowel with no small bowel wall thickening. Postsurgical changes from subtotal right hemicolectomy with ileocolic anastomosis in the right abdomen. No large-bowel wall thickening, significant diverticulosis or significant pericolonic fat stranding. Vascular/Lymphatic: Atherosclerotic nonaneurysmal abdominal aorta. Stable mildly enlarged 1.1 cm porta hepatis node (series 2/image 29). No additional pathologically enlarged abdominopelvic nodes. Reproductive: Top-normal size prostate. Other: No pneumoperitoneum, ascites or focal fluid collection. Musculoskeletal: No aggressive appearing focal osseous lesions. Marked thoracolumbar spondylosis. IMPRESSION: 1. Known pancreatic head malignancy is unchanged. Well-positioned CBD stent with pneumobilia in the intrahepatic bile ducts indicating stent patency. 2. Cholelithiasis. New diffuse gallbladder wall thickening and pericholecystic fat stranding. Acute cholecystitis cannot be excluded. 3. Diffuse liver metastases have mildly increased in size. 4. Bilateral pulmonary nodules at the lung bases, mildly increased in size at the left lung base, worrisome for pulmonary metastases. 5. Stable mild porta hepatis adenopathy. 6. Stable right adrenal adenoma. 7. No evidence of bowel obstruction or acute bowel inflammation. 8.  Aortic Atherosclerosis (ICD10-I70.0). Electronically Signed   By: Ilona Sorrel M.D.   On: 05/20/2019 16:04   Dg Chest Port 1 View  Result Date: 05/20/2019 CLINICAL DATA:  Weakness, fall EXAM: PORTABLE CHEST 1 VIEW COMPARISON:  Radiograph May 03, 2019 FINDINGS: Bandlike areas of scarring and/or subsegmental atelectasis seen in both bases, right greater than left. Central vascular crowding. No consolidation,  features of edema, pneumothorax, or effusion. Pulmonary vascularity is normally distributed. The aorta is calcified. The remaining cardiomediastinal contours are unremarkable. No acute osseous or soft tissue abnormality. Degenerative changes are present in the imaged spine and shoulders. IMPRESSION: Basilar atelectasis and/or scarring. Otherwise no acute cardiopulmonary or traumatic abnormality Electronically Signed   By: Lovena Le M.D.   On: 05/20/2019 18:22   US Abdomen Limited Ruq  Result Date: 05/20/2019 CLINICAL DATA:  Cholecystitis EXAM: ULTRASOUND ABDOMEN LIMITED RIGHT UPPER QUADRANT COMPARISON:  Same day CT FINDINGS: Gallbladder: There is a mixture of biliary sludge and echogenic shadowing gallstones with several hyperechogenic, dirty shadowing foci of intraluminal gas. There is a thickened echogenic gallbladder wall measuring up to 8.4 mm. Small volume of pericholecystic fluid is present.Sonographic Percell Miller sign is reportedly negative. Common bile duct: Diameter: 5.2 mm, not dilated. Biliary stent present in the proximal common bile duct. Liver: Difficult visualization of entire liver parenchyma due to overlying bowel gas. Several echogenic shadowing foci within the liver are likely reflective the pneumatosis seen on prior CT. Portal vein is patent on color Doppler imaging with normal direction of blood flow towards the liver. Other: None. IMPRESSION: Biliary stent in place with mild intrahepatic biliary ductal dilatation and pneumatosis. Cholelithiasis and biliary sludge in with foci of intraluminal gas. Wall thickening and pericholecystic fluid are suggestive of acute cholecystitis though sonographic Percell Miller sign is reportedly negative. Could correlate for history of analgesia which may limit the diagnostic utility of the sonographic Murphy sign. Electronically Signed   By: Lovena Le M.D.   On: 05/20/2019 20:08    Anti-infectives: Anti-infectives (  From admission, onward)   Start     Dose/Rate  Route Frequency Ordered Stop   05/21/19 0600  piperacillin-tazobactam (ZOSYN) IVPB 3.375 g     3.375 g 12.5 mL/hr over 240 Minutes Intravenous Every 8 hours 05/20/19 1820     05/20/19 1830  piperacillin-tazobactam (ZOSYN) IVPB 3.375 g     3.375 g 100 mL/hr over 30 Minutes Intravenous  Once 05/20/19 1819 05/20/19 2344       Assessment/Plan -Remote history of colon cancer status post chemotherapy and colectomy -Remote history of prostate cancer status post radiation treatment -HTN -HLD -IDDM -Newly dx pancreatic cancer with likely metastatic disease to liver and possibly lungs - followed by Dr. Benay Spice of Oncology. Not currently on therapy. S/p CBD stent by Dr. Watt Climes on 8/10. He met with Dr. Benay Spice yesterday. Per their note, the patient does not wish to consider chemotherapy for treatment. He met with palliative care. Per their note patient wishes to transition home at discharge with home hospice.   Epigastric Abdominal Pain Possible Acute Cholecystitis  - CTAP showed cholelithiasis with gallbladder wall thickening and pericholecystic fat stranding.   - Right upper quadrant ultrasound showed cholelithiasis and biliary sludge with gallbladder wall thickening and pericholecystic fluid suggestive of acute cholecystitis.   - WBC and LFT's downtrending - Given the patient is newly diagnosed pancreatic cancer with likely metastatic disease to the liver, I do not believe the patient is an operative candidate for laparoscopic cholecystectomy.  I agree with conservative management. He appears to be improving based on labs and exam. If patient does not improve, could place palliative Perc Chole drain.  We will sign off at this time. Please call back with any questions.   ID - Zosyn 05/20/2019 >>  VTE - SCD, Heparin FEN - CM diet (patient is not eating much, normally would back him down to CLD but I think in this scenario, it is okay to leave on CM diet).  Plan: We will sign off at this time.  Recs as above. Please call back with any questions.    LOS: 1 day    Jillyn Ledger , [facility] Surgery 05/22/2019, 9:21 AM Pager: [phone]

## 2019-05-22 NOTE — Progress Notes (Signed)
Daily Progress Note   Patient Name: Martavion Couper.       Date: 05/22/2019 DOB: Feb 22, 1928  Age: 85 y.o. MRN#: [id number] Attending Physician: Donne Hazel, MD Primary Care Physician: Mayra Neer, MD Admit Date: 05/20/2019  Reason for Consultation/Follow-up: Establishing goals of care  Subjective: I met today with Mr. Haegele and his wife.  He reports feeling better today than yesterday.  We reviewed clinical course overnight as well as pathways forward for his care with metastatic pancreatic cancer and acute cholecystitis.  Discussed plan to continue antibiotics but he is not interested in disease modifying therapy for his cancer.  He reports wanting to discharge home, but his wife is shaking her head no as he says this.  She states that she does not think that it is safe for him to be at home as he has been falling  And remains weak.    Length of Stay: 1  Current Medications: Scheduled Meds:  . aspirin EC  325 mg Oral BID  . feeding supplement  1 Container Oral BID BM  . feeding supplement (ENSURE ENLIVE)  237 mL Oral BID BM  . heparin  5,000 Units Subcutaneous Q8H  . insulin aspart  0-9 Units Subcutaneous TID WC  . multivitamin with minerals  1 tablet Oral Daily  . nebivolol  20 mg Oral Daily  . senna  1 tablet Oral BID  . sodium chloride flush  3 mL Intravenous Q12H  . tamsulosin  0.4 mg Oral QPC supper    Continuous Infusions: . sodium chloride    . chlorproMAZINE (THORAZINE) IV    . piperacillin-tazobactam (ZOSYN)  IV 3.375 g (05/22/19 1459)    PRN Meds: albuterol, chlorproMAZINE (THORAZINE) IV, diclofenac sodium, ibuprofen, ondansetron **OR** ondansetron (ZOFRAN) IV, oxyCODONE, polyethylene glycol, promethazine, sodium chloride, sodium phosphate, sorbitol   Physical Exam         General: Alert, awake, in no acute distress.  HEENT: No bruits, no goiter, no JVD Heart: Regular rate and rhythm. No murmur appreciated. Lungs: Good air movement, clear Abdomen: Soft, nontender, nondistended, positive bowel sounds.  Ext: No significant edema Skin: Warm and dry Neuro: Grossly intact, nonfocal.  Vital Signs: BP (!) 157/62 (BP Location: Right Arm)   Pulse 65   Temp 97.9 F (36.6 C) (Oral)   Resp 20  Ht _0  (1.93 m)   Wt 102.8 kg   SpO2 100%   BMI 27.59 kg/m  SpO2: SpO2: 100 % O2 Device: O2 Device: Room Air O2 Flow Rate: O2 Flow Rate (L/min): 2 L/min(placed for comfort per MD order)  Intake/output summary:   Intake/Output Summary (Last 24 hours) at 05/22/2019 2121 Last data filed at 05/22/2019 2119 Gross per 24 hour  Intake 2031.46 ml  Output 1125 ml  Net 906.46 ml   LBM: Last BM Date: 05/09/19(per pt, states this is normal) Baseline Weight: Weight: 103 kg Most recent weight: Weight: 102.8 kg       Palliative Assessment/Data:      Patient Active Problem List   Diagnosis Date Noted  . Acute cholecystitis 05/21/2019  . Weakness generalized 05/20/2019  . Pancreatic cancer ([place]) 05/20/2019  . Acute kidney injury superimposed on CKD ([place]) 05/20/2019  . Dehydration 05/20/2019  . Leukocytosis 05/20/2019  . Anemia 05/20/2019  . Transaminitis 05/20/2019  . Cholecystitis   . Pancreatic mass 05/03/2019  . Elevated LFTs 05/03/2019  . Failure to thrive in adult 05/03/2019  . Total knee replacement status 09/14/2016  . Primary osteoarthritis of left knee   . Bilateral leg weakness 05/21/2015  . Atherosclerosis of native arteries of extremity with intermittent claudication ([place]) 11/13/2014  . Pain in lower limb 01/09/2014  . Chronic diastolic heart failure ([place]) 09/23/2013  . Near syncope 09/22/2013  . Essential hypertension, benign 09/22/2013  . Type 2 diabetes mellitus without complication, with long-term current use of  insulin ([place]) 09/22/2013  . Pain in joint, ankle and foot 04/01/2013  . PVD (peripheral vascular disease) ([place]) 12/31/2012  . Onychomycosis due to dermatophyte 12/31/2012    Palliative Care Assessment & Plan   Patient Profile: 86 y.o. male  with past medical history of recently diagnosed pancreatic cancer with metastasis, CBD stent placement, HTN, prior colon and prostate cancer, IDDM, gout, HLP, anemia, arthritis admitted on 05/20/2019 with weakness with concern for acute cholecystis as well as AKI.  Recommendations/Plan:  While patient goal is to return home and he is agreeable to hospice services, his wife reports that she does not this this is safe and she cannot care for him as weak as he is and with his recent falls.  While eventual goal would be to transition home (and likely elect hospice benefit at that point), she feels that he needs to have rehab with hope of regaining enough functional status that she will be able to meet his care needs at home.  Discussed with Dr. Wyline Copas and consult placed to social work for further assistance.  Code Status:    Code Status Orders  (From admission, onward)         Start     Ordered   05/20/19 2015  Do not attempt resuscitation (DNR)  Continuous    Question Answer Comment  In the event of cardiac or respiratory ARREST Do not call a "code blue"   In the event of cardiac or respiratory ARREST Do not perform Intubation, CPR, defibrillation or ACLS   In the event of cardiac or respiratory ARREST Use medication by any route, position, wound care, and other measures to relive pain and suffering. May use oxygen, suction and manual treatment of airway obstruction as needed for comfort.      05/20/19 2015        Code Status History    Date Active Date Inactive Code Status Order ID Comments User Context   05/03/2019 2231 05/07/2019  [place] Full Code [id number]  Truett Mainland, DO Inpatient   09/14/2016 2004 09/16/2016 1611 Full Code [id number]  Leandrew Koyanagi, MD Inpatient   09/22/2013 1610 09/23/2013 2051 Full Code [id number]  Hosie Poisson, MD Inpatient   Advance Care Planning Activity    Advance Directive Documentation     Most Recent Value  Type of Advance Directive  Living will  Pre-existing out of facility DNR order (yellow form or pink MOST form)  -  "MOST" Form in Place?  -       Prognosis:   < 6 months  Discharge Planning:  To Be Determined  Care plan was discussed with patient, wife, RN, Dr. Wyline Copas  Thank you for allowing the Palliative Medicine Team to assist in the care of this patient.   Time In: 1510 Time Out: 1550 Total Time 40 Prolonged Time Billed NO      Greater than 50%  of this time was spent counseling and coordinating care related to the above assessment and plan.  Micheline Rough, MD  Please contact Palliative Medicine Team phone at [phone] for questions and concerns.

## 2019-05-22 NOTE — Progress Notes (Signed)
PROGRESS NOTE    Damon Snyder.  [id number] DOB: 1928/01/22 DOA: 05/20/2019 PCP: Mayra Neer, MD    Brief Narrative:  85 y.o. male with medical history significant of pancreatic cancer with probable metastatic disease to the liver and lungs, recently diagnosed per ERCP on 05/05/2019 status post common bile duct stent placement, hypertension, prior history of colon cancer status post chemotherapy, history of prostate cancer status post radiation treatment, insulin-dependent diabetes mellitus type 2, gout, hyperlipidemia, anemia, arthritis presented to the ED with sudden onset generalized weakness and dizziness.  Patient stated on day of admission he woke up feeling okay was getting ready to go to his doctor's visit with his oncologist when he felt dizzy and felt his legs gave out and subsequently went down and was unable to get up.  Patient denies any syncopal episode.  Patient does endorse some subjective fevers and chills, episode of nausea and emesis which was brown the night prior to admission after he took his medication, some shortness of breath, constipation, lightheadedness.  Patient denies any diarrhea, no melena, no hematemesis, no hematochezia.  Patient stated he noted he had dark brown stool.  Patient denies any chest pain.  Patient does endorse upper abdominal pain.  Patient subsequently presented to the ED.  Assessment & Plan:   Principal Problem:   Weakness generalized Active Problems:   PVD (peripheral vascular disease) (HCC)   Essential hypertension, benign   Type 2 diabetes mellitus without complication, with long-term current use of insulin (HCC)   Chronic diastolic heart failure (HCC)   Pancreatic mass   Failure to thrive in adult   Pancreatic cancer (HCC)   Acute kidney injury superimposed on CKD (HCC)   Dehydration   Leukocytosis   Anemia   Transaminitis   Acute cholecystitis  1 generalized weakness -Suspect secondary to dehydration in addition to acute  cholecystitis -Cont on hydration as tolerated -PT/OTconsulted, recommendations for home health PT if wife is able to give 24hr supervision -Stable at present  2.  Probable acute cholecystitis -Not septic at this time -LFT's and CT abd consistent with acute cholecystitis -Will cont on IV zosyn -General Surgery following. Recommend continued abx and if no improvement, possible perc tube down the road -Pt reports feeling better this AM  3.  Acute kidney injury on chronic kidney disease stage III -Patient with a baseline creatinine from 1.5-1.8.   -Labs reviewed. Cr is improving with hydration  4.  Dehydration -Improved with hydration  5.  Leukocytosis -Secondary to cholecystitis -UA unremarkable -Improved with zosyn  6.  Transaminitis -Likely secondary to metastatic pancreatic cancer.  -Seems stable this AM  7.  Recently diagnosed pancreatic cancer with probable mets to the liver and lungs. -Followed by Dr. Benay Spice -Pt with wishes for no chemo or tx, goal is hospice -Palliative Care following, appreciate input  8.  Insulin-dependent diabetes mellitus type 2 -Last hemoglobin A1c was 6.0 on 05/03/2019.   -Periods of hypoglycemia noted this admit -Have d/c'd lantus. Continue on SSI coverage as needed  9.  Hypertension -Holding nifedipine.   -Continue beta-blocker as tolerated  10.  Hyperlipidemia -Continuing to hold statin -Stable currently  11.  Chronic diastolic heart failure/peripheral vascular disease -Currently euvolemic and stable.   -Continue aspirin, beta-blocker.  -Holding nifedipine for now. Continue to follow  12.  Normocytic anemia -Likely secondary to anemia of chronic disease.   -No evidence of bleeding.  FOBT was negative.   -Remains hemodynamically stable  DVT prophylaxis: heparin subq Code Status:  DNR Family Communication: Pt in room, family not at bedside Disposition Plan: Uncertain at this time  Consultants:   General  Surgery  Palliative Care  Oncology  Procedures:     Antimicrobials: Anti-infectives (From admission, onward)   Start     Dose/Rate Route Frequency Ordered Stop   05/21/19 0600  piperacillin-tazobactam (ZOSYN) IVPB 3.375 g     3.375 g 12.5 mL/hr over 240 Minutes Intravenous Every 8 hours 05/20/19 1820     05/20/19 1830  piperacillin-tazobactam (ZOSYN) IVPB 3.375 g     3.375 g 100 mL/hr over 30 Minutes Intravenous  Once 05/20/19 1819 05/20/19 2344      Subjective: Reported BM this AM with some relief of abd discomfort  Objective: Vitals:   05/21/19 2203 05/22/19 0408 05/22/19 1338 05/22/19 1434  BP:  (!) 135/56  (!) 131/50  Pulse: 73 69  65  Resp:  18  18  Temp:  98.2 F (36.8 C)  98 F (36.7 C)  TempSrc:    Oral  SpO2: 96% 100% 100% 100%  Weight:      Height:        Intake/Output Summary (Last 24 hours) at 05/22/2019 1524 Last data filed at 05/22/2019 1500 Gross per 24 hour  Intake 2031.46 ml  Output 950 ml  Net 1081.46 ml   Filed Weights   05/20/19 1238 05/20/19 2015 05/21/19 0425  Weight: 103 kg 102.9 kg 102.8 kg    Examination: General exam: Awake, laying in bed, in nad Respiratory system: Normal respiratory effort, no wheezing Cardiovascular system: regular rate, s1, s2 Gastrointestinal system: Soft, nondistended, positive BS Central nervous system: CN2-12 grossly intact, strength intact Extremities: Perfused, no clubbing Skin: Normal skin turgor, no notable skin lesions seen Psychiatry: Mood normal // no visual hallucinations   Data Reviewed: I have personally reviewed following labs and imaging studies  CBC: Recent Labs  Lab 05/20/19 1345 05/21/19 0435 05/22/19 0340  WBC 17.8* 15.4* 11.3*  NEUTROABS 16.2* 13.2*  --   HGB 9.6* 9.1* 8.4*  HCT 30.4* 29.2* 27.4*  MCV 90.5 92.4 91.6  PLT 229 223 [id number]   Basic Metabolic Panel: Recent Labs  Lab 05/20/19 1345 05/20/19 2015 05/21/19 0435 05/22/19 0340  NA 135  --  137 138  K 3.6  --  4.0  3.4*  CL 97*  --  104 105  CO2 22  --  22 22  GLUCOSE 133*  --  69* 69*  BUN 61*  --  57* 46*  CREATININE 2.59*  --  2.03* 1.63*  CALCIUM 8.6*  --  8.4* 8.1*  MG  --  2.3  --   --    GFR: Estimated Creatinine Clearance: 37.7 mL/min (A) (by C-G formula based on SCr of 1.63 mg/dL (H)). Liver Function Tests: Recent Labs  Lab 05/20/19 1345 05/21/19 0435 05/22/19 0340  AST 60* 70* 51*  ALT 55* 57* 51*  ALKPHOS 182* 162* 151*  BILITOT 2.0* 1.6* 1.4*  PROT 6.8 6.4* 5.7*  ALBUMIN 2.6* 2.5* 2.1*   No results for input(s): LIPASE, AMYLASE in the last 168 hours. No results for input(s): AMMONIA in the last 168 hours. Coagulation Profile: No results for input(s): INR, PROTIME in the last 168 hours. Cardiac Enzymes: No results for input(s): CKTOTAL, CKMB, CKMBINDEX, TROPONINI in the last 168 hours. BNP (last 3 results) No results for input(s): PROBNP in the last 8760 hours. HbA1C: No results for input(s): HGBA1C in the last 72 hours. CBG: Recent Labs  Lab 05/22/19  0407 05/22/19 0440 05/22/19 0500 05/22/19 0527 05/22/19 0756  GLUCAP 66* 61* 71 80 113*   Lipid Profile: No results for input(s): CHOL, HDL, LDLCALC, TRIG, CHOLHDL, LDLDIRECT in the last 72 hours. Thyroid Function Tests: No results for input(s): TSH, T4TOTAL, FREET4, T3FREE, THYROIDAB in the last 72 hours. Anemia Panel: Recent Labs    05/21/19 0435  VITAMINB12 772  FOLATE 8.4  FERRITIN 1,118*  TIBC 130*  IRON 15*  RETICCTPCT 1.0   Sepsis Labs: No results for input(s): PROCALCITON, LATICACIDVEN in the last 168 hours.  Recent Results (from the past 240 hour(s))  SARS CORONAVIRUS 2 (TAT 6-12 HRS) Nasal Swab Aptima Multi Swab     Status: None   Collection Time: 05/20/19  3:40 PM   Specimen: Aptima Multi Swab; Nasal Swab  Result Value Ref Range Status   SARS Coronavirus 2 NEGATIVE NEGATIVE Final    Comment: (NOTE) SARS-CoV-2 target nucleic acids are NOT DETECTED. The SARS-CoV-2 RNA is generally detectable  in upper and lower respiratory specimens during the acute phase of infection. Negative results do not preclude SARS-CoV-2 infection, do not rule out co-infections with other pathogens, and should not be used as the sole basis for treatment or other patient management decisions. Negative results must be combined with clinical observations, patient history, and epidemiological information. The expected result is Negative. Fact Sheet for Patients: SugarRoll.be Fact Sheet for Healthcare Providers: https://www.woods-mathews.com/ This test is not yet approved or cleared by the [place] FDA and  has been authorized for detection and/or diagnosis of SARS-CoV-2 by FDA under an Emergency Use Authorization (EUA). This EUA will remain  in effect (meaning this test can be used) for the duration of the COVID-19 declaration under Section 56 4(b)(1) of the Act, 21 U.S.C. section 360bbb-3(b)(1), unless the authorization is terminated or revoked sooner. Performed at [facility], [place] [address] [place], [place] [address]   Culture, Urine     Status: None   Collection Time: 05/21/19  4:26 AM   Specimen: Urine, Clean Catch  Result Value Ref Range Status   Specimen Description   Final    URINE, CLEAN CATCH Performed at [facility], [place] [address] [place], [place] [address]    Special Requests   Final    NONE Performed at [facility], [place] [address], [place], [place] [address]    Culture   Final    NO GROWTH Performed at [facility], [place] [address] [place], [place] [address]    Report Status 05/22/2019 FINAL  Final     Radiology Studies: Ct Abdomen Pelvis Wo Contrast  Result Date: 05/20/2019 CLINICAL DATA:  Nausea and vomiting. Left upper quadrant abdominal pain. History of recently diagnosed pancreatic cancer. Generalized weakness. Loss of appetite. EXAM: CT ABDOMEN AND PELVIS WITHOUT CONTRAST  TECHNIQUE: Multidetector CT imaging of the abdomen and pelvis was performed following the standard protocol without IV contrast. COMPARISON:  05/04/2019 MRI abdomen.  05/03/2019 CT abdomen/pelvis. FINDINGS: Lower chest: Left lower lobe 6 mm pulmonary nodule (series 6/image 35), increased from 4 mm on 05/03/2019 CT. Right middle lobe peripheral 4 mm pulmonary nodule (series 6/image 4), not previously imaged. Mild-to-moderate bibasilar atelectasis. Coronary atherosclerosis. Low cardiac blood pool density indicates anemia. Hepatobiliary: Normal liver size. Numerous (greater than 8) small hypodense liver masses scattered throughout the liver, mildly increased since 05/03/2019 CT. Representative posterior right upper lobe 1.6 cm hypodense mass (series 2/image 12), increased from 1.2 cm. Lateral segment left liver lobe  1.2 cm mass (series 2/image 22), increased from 0.8 cm. Tiny layering calcified gallstones within the mildly distended gallbladder. New mild diffuse gallbladder wall thickening and pericholecystic fat haziness. Scattered pneumobilia within the gallbladder and intrahepatic bile ducts. Well-positioned CBD stent terminating in the duodenal lumen. Pancreas: Known ill-defined pancreatic head mass measuring approximately 4.7 cm (series 2/image 34), not appreciably changed. Stable parenchymal atrophy and duct dilation throughout the pancreatic body and tail. Spleen: Normal size. No mass. Adrenals/Urinary Tract: Stable 1.0 cm right adrenal adenoma with density 1 HU. No discrete left adrenal nodules. No renal stones. No hydronephrosis. Previously described indeterminate renal cortical lesion in the upper left kidney on 05/04/2019 MRI is non contour deforming and occult on this noncontrast CT. Bilateral simple renal cysts, largest 4.3 cm in the lateral interpolar right kidney. Normal bladder. Stomach/Bowel: Normal non-distended stomach. Normal caliber small bowel with no small bowel wall thickening. Postsurgical  changes from subtotal right hemicolectomy with ileocolic anastomosis in the right abdomen. No large-bowel wall thickening, significant diverticulosis or significant pericolonic fat stranding. Vascular/Lymphatic: Atherosclerotic nonaneurysmal abdominal aorta. Stable mildly enlarged 1.1 cm porta hepatis node (series 2/image 29). No additional pathologically enlarged abdominopelvic nodes. Reproductive: Top-normal size prostate. Other: No pneumoperitoneum, ascites or focal fluid collection. Musculoskeletal: No aggressive appearing focal osseous lesions. Marked thoracolumbar spondylosis. IMPRESSION: 1. Known pancreatic head malignancy is unchanged. Well-positioned CBD stent with pneumobilia in the intrahepatic bile ducts indicating stent patency. 2. Cholelithiasis. New diffuse gallbladder wall thickening and pericholecystic fat stranding. Acute cholecystitis cannot be excluded. 3. Diffuse liver metastases have mildly increased in size. 4. Bilateral pulmonary nodules at the lung bases, mildly increased in size at the left lung base, worrisome for pulmonary metastases. 5. Stable mild porta hepatis adenopathy. 6. Stable right adrenal adenoma. 7. No evidence of bowel obstruction or acute bowel inflammation. 8.  Aortic Atherosclerosis (ICD10-I70.0). Electronically Signed   By: Ilona Sorrel M.D.   On: 05/20/2019 16:04   Dg Chest Port 1 View  Result Date: 05/20/2019 CLINICAL DATA:  Weakness, fall EXAM: PORTABLE CHEST 1 VIEW COMPARISON:  Radiograph May 03, 2019 FINDINGS: Bandlike areas of scarring and/or subsegmental atelectasis seen in both bases, right greater than left. Central vascular crowding. No consolidation, features of edema, pneumothorax, or effusion. Pulmonary vascularity is normally distributed. The aorta is calcified. The remaining cardiomediastinal contours are unremarkable. No acute osseous or soft tissue abnormality. Degenerative changes are present in the imaged spine and shoulders. IMPRESSION: Basilar  atelectasis and/or scarring. Otherwise no acute cardiopulmonary or traumatic abnormality Electronically Signed   By: Lovena Le M.D.   On: 05/20/2019 18:22   US Abdomen Limited Ruq  Result Date: 05/20/2019 CLINICAL DATA:  Cholecystitis EXAM: ULTRASOUND ABDOMEN LIMITED RIGHT UPPER QUADRANT COMPARISON:  Same day CT FINDINGS: Gallbladder: There is a mixture of biliary sludge and echogenic shadowing gallstones with several hyperechogenic, dirty shadowing foci of intraluminal gas. There is a thickened echogenic gallbladder wall measuring up to 8.4 mm. Small volume of pericholecystic fluid is present.Sonographic Percell Miller sign is reportedly negative. Common bile duct: Diameter: 5.2 mm, not dilated. Biliary stent present in the proximal common bile duct. Liver: Difficult visualization of entire liver parenchyma due to overlying bowel gas. Several echogenic shadowing foci within the liver are likely reflective the pneumatosis seen on prior CT. Portal vein is patent on color Doppler imaging with normal direction of blood flow towards the liver. Other: None. IMPRESSION: Biliary stent in place with mild intrahepatic biliary ductal dilatation and pneumatosis. Cholelithiasis and biliary sludge in with foci of  intraluminal gas. Wall thickening and pericholecystic fluid are suggestive of acute cholecystitis though sonographic Percell Miller sign is reportedly negative. Could correlate for history of analgesia which may limit the diagnostic utility of the sonographic Murphy sign. Electronically Signed   By: Lovena Le M.D.   On: 05/20/2019 20:08    Scheduled Meds:  aspirin EC  325 mg Oral BID   heparin  5,000 Units Subcutaneous Q8H   insulin aspart  0-9 Units Subcutaneous TID WC   nebivolol  20 mg Oral Daily   senna  1 tablet Oral BID   sodium chloride flush  3 mL Intravenous Q12H   tamsulosin  0.4 mg Oral QPC supper   Continuous Infusions:  sodium chloride     sodium chloride 100 mL/hr at 05/22/19 0826    chlorproMAZINE (THORAZINE) IV     piperacillin-tazobactam (ZOSYN)  IV 3.375 g (05/22/19 1459)     LOS: 1 day   Marylu Lund, MD [facility] Pager On Amion  If 7PM-7AM, please contact night-coverage 05/22/2019, 3:24 PM

## 2019-05-22 NOTE — Consult Note (Signed)
Consultation Note Date: 05/22/2019   Patient Name: Damon Snyder.  DOB: 06/14/1928  MRN: [id number]  Age / Sex: 85 y.o., male  PCP: Mayra Neer, MD Referring Physician: Donne Hazel, MD  Reason for Consultation: Establishing goals of care  HPI/Patient Profile: 85 y.o. male  with past medical history of recently diagnosed pancreatic cancer with metastasis, CBD stent placement, HTN, prior colon and prostate cancer, IDDM, gout, HLP, anemia, arthritis admitted on 05/20/2019 with weakness with concern for acute cholecystis as well as AKI.  In ED, he had stated that he was not interested in chemotherapy and may not be interested in any interventions, including not continuing antibiotics.   Clinical Assessment and Goals of Care: Chart reviewed including personal review of pertinent labs and imaging.  Discussed case with Dr. Wyline Copas and bedside RN.  I met today with [name].  I introduced palliative care as specialized medical care for people living with serious illness. It focuses on providing relief from the symptoms and stress of a serious illness. The goal is to improve quality of life for both the patient and the family.  He reports that they physicians have been doing a good job explaining things to him.  He knows that he has incurable malignancy and states that he is not interested in chemotherapy or other interventions for his metastatic pancreatic disease.  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  He states that he knows he is coming to the end of his life and he wants "God's will to be done."  We discussed options moving forward including continuation of antibiotics and following his clinical course over the next couple of days vs stopping all interventions (with likely transition to residential hospice) as previously noted in chart.  He states that he is not interested in aggressive  therapies, but he is feeling better today than yesterday and would "keep doing what we are doing and see what tomorrow brings."  Overall, he stated that he would like to be at home with his wife who cares for him.  States, "she is younger than I am and can care for me."  She is noted in chart to have called cancer center and being concerned that she cannot care for him.  I later returned to his room and was able to speak with his wife.  He slept through this encounter as he had received phenergan for his nausea.  His wife reports understanding that he has metastatic cancer but was unaware of concern for acute cholecystitis.  We discussed options for care moving forward and she reports being in agreement with plan to continue current antibiotics and follow his clinical course.  Discussed surgery evaluation and recommendation to hold on drain placement at this time.    She reports that she feels overwhelmed and is not able to care for him at home as weak as his is.  She became very nervous with any talk of him returning home, even with hospice support.  SUMMARY OF RECOMMENDATIONS   - Continue current interventions.  Plan to follow clinical course for the next 24-48 hours and reassess his situation. - He has stated desire to transition home at discharge and is agreeable to hospice support at home.  His wife today states she is concerned about caring for him, even with the support of hospice.  Plan for PT/OT eval and will discuss these with wife and patient tomorrow.  Code Status/Advance Care Planning:  DNR  Psycho-social/Spiritual:   Desire for further Chaplaincy support:Did not address today.  Additional Recommendations: Education on Hospice  Prognosis:   < 6 months  Discharge Planning: To Be Determined      Primary Diagnoses: Present on Admission: . Chronic diastolic heart failure ([place]) . PVD (peripheral vascular disease) ([place]) . Essential hypertension, benign . Failure to  thrive in adult . Pancreatic mass . Pancreatic cancer ([place]) . Acute kidney injury superimposed on CKD ([place]) . Dehydration . Leukocytosis . Anemia . Transaminitis . Acute cholecystitis   I have reviewed the medical record, interviewed the patient and family, and examined the patient. The following aspects are pertinent.  Past Medical History:  Diagnosis Date  . Anemia    h/o   . Arthritis    "left knee; shoulders" (09/14/2016)  . Colon cancer ([place])     tx : chemo)   . Difficulty in walking(719.7)   . Dyspnea    relative to pain in her leg  . Foot ulcer ([place])   . Gout   . High cholesterol   . History of blood transfusion    "that's when they found out about the colon cancer"  . Hypertension   . Lower GI bleed   . Prostate cancer [facility])     treated with radiation   . Type II diabetes mellitus ([place])    Social History   Socioeconomic History  . Marital status: Married    Spouse name: Not on file  . Number of children: Not on file  . Years of education: Not on file  . Highest education level: Not on file  Occupational History  . Not on file  Social Needs  . Financial resource strain: Not on file  . Food insecurity    Worry: Not on file    Inability: Not on file  . Transportation needs    Medical: Not on file    Non-medical: Not on file  Tobacco Use  . Smoking status: Never Smoker  . Smokeless tobacco: Never Used  Substance and Sexual Activity  . Alcohol use: No    Alcohol/week: 0.0 standard drinks  . Drug use: No  . Sexual activity: Never  Lifestyle  . Physical activity    Days per week: Not on file    Minutes per session: Not on file  . Stress: Not on file  Relationships  . Social Herbalist on phone: Not on file    Gets together: Not on file    Attends religious service: Not on file    Active member of club or organization: Not on file    Attends meetings of clubs or organizations: Not on file    Relationship status: Not on file  Other  Topics Concern  . Not on file  Social History Narrative  . Not on file   Family History  Problem Relation Age of Onset  . Hypertension Mother   . Cancer Sister        Ovarian  . Hypertension Father   .  Heart disease Brother        After age 47  . Kidney disease Sister    Scheduled Meds: . aspirin EC  325 mg Oral BID  . heparin  5,000 Units Subcutaneous Q8H  . insulin aspart  0-9 Units Subcutaneous TID WC  . nebivolol  20 mg Oral Daily  . potassium chloride  40 mEq Oral Once  . senna  1 tablet Oral BID  . sodium chloride flush  3 mL Intravenous Q12H  . tamsulosin  0.4 mg Oral QPC supper   Continuous Infusions: . sodium chloride    . sodium chloride 100 mL/hr at 05/22/19 0605  . chlorproMAZINE (THORAZINE) IV    . piperacillin-tazobactam (ZOSYN)  IV 3.375 g (05/22/19 0605)   PRN Meds:.albuterol, chlorproMAZINE (THORAZINE) IV, diclofenac sodium, ibuprofen, ondansetron **OR** ondansetron (ZOFRAN) IV, oxyCODONE, polyethylene glycol, promethazine, sodium chloride, sodium phosphate, sorbitol Medications Prior to Admission:  Prior to Admission medications   Medication Sig Start Date End Date Taking? Authorizing Provider  allopurinol (ZYLOPRIM) 100 MG tablet Take 200 mg by mouth daily as needed (gout).    Yes [provider]  aspirin EC 325 MG tablet Take 1 tablet (325 mg total) by mouth 2 (two) times daily. 09/14/16  Yes Leandrew Koyanagi, MD  atorvastatin (LIPITOR) 20 MG tablet Take 20 mg by mouth daily.   Yes [provider]  diclofenac sodium (VOLTAREN) 1 % GEL Apply 4 g topically 4 (four) times daily as needed. Patient taking differently: Apply 2 g topically 4 (four) times daily as needed (for pain).  04/21/19  Yes Hilts, Legrand Como, MD  diphenhydrAMINE (BENADRYL) 25 mg capsule Take 25 mg by mouth every 6 (six) hours as needed for itching or allergies.   Yes [provider]  LANTUS SOLOSTAR 100 UNIT/ML Solostar Pen Inject 50 Units into the skin at bedtime.   11/17/14  Yes [provider]  Nebivolol HCl (BYSTOLIC) 20 MG TABS Take 20 mg by mouth daily.   Yes [provider]  NIFEdipine (ADALAT CC) 60 MG 24 hr tablet Take 1 tablet (60 mg total) by mouth daily. 05/08/19  Yes Vasireddy, Grier Mitts, MD  ondansetron (ZOFRAN) 8 MG tablet Take 1 tablet (8 mg total) by mouth every 8 (eight) hours as needed for nausea or vomiting. 05/19/19  Yes Ladell Pier, MD  oxyCODONE (OXY IR/ROXICODONE) 5 MG immediate release tablet Take 1 tablet (5 mg total) by mouth every 4 (four) hours as needed for severe pain. 05/19/19  Yes Ladell Pier, MD  sodium chloride (OCEAN) 0.65 % SOLN nasal spray Place 1 spray into both nostrils as needed for congestion.   Yes [provider]  tamsulosin (FLOMAX) 0.4 MG CAPS capsule Take 1 capsule (0.4 mg total) by mouth daily after supper. 05/07/19  Yes Monica Becton, MD  NOVOFINE 32G X 6 MM MISC  03/11/13   [provider]  [facility] VERIO test strip 1 STRIP TWICE A DAY FOR BLOOD SUGAR DX E11.22 FINGERSTICK 09/09/18   [provider]   No Known Allergies Review of Systems  Constitutional: Positive for activity change, appetite change and fatigue.  Gastrointestinal: Positive for abdominal distention and abdominal pain.  Neurological: Positive for weakness.  Psychiatric/Behavioral: Positive for sleep disturbance.   Physical Exam General: Alert, awake, in no acute distress.  HEENT: No bruits, no goiter, no JVD Heart: Regular rate and rhythm. No murmur appreciated. Lungs: Good air movement, clear Abdomen: Soft, nontender, nondistended, positive bowel sounds.  Ext: No significant edema  Skin: Warm and dry Neuro: Grossly intact, nonfocal.   Vital Signs: BP (!) 135/56 (BP Location: Right Arm)   Pulse 69   Temp 98.2 F (36.8 C)   Resp 18   Ht 6' 4"  (1.93 m)   Wt 102.8 kg   SpO2 100%   BMI 27.59 kg/m  Pain Scale: 0-10   Pain Score: 0-No pain   SpO2: SpO2: 100 % O2 Device:SpO2:  100 % O2 Flow Rate: .   IO: Intake/output summary:   Intake/Output Summary (Last 24 hours) at 05/22/2019 0749 Last data filed at 05/22/2019 9169 Gross per 24 hour  Intake 2271.46 ml  Output 1300 ml  Net 971.46 ml    LBM: Last BM Date: 05/17/19 Baseline Weight: Weight: 103 kg Most recent weight: Weight: 102.8 kg     Palliative Assessment/Data:     Time: 0900-1000; 1330-1445 Time Total: 105 minutes Greater than 50%  of this time was spent counseling and coordinating care related to the above assessment and plan.  Signed by: Micheline Rough, MD   Please contact Palliative Medicine Team phone at [phone] for questions and concerns.  For individual provider: See Shea Evans

## 2019-05-23 DIAGNOSIS — K8080 Other cholelithiasis without obstruction: Secondary | ICD-10-CM

## 2019-05-23 LAB — GLUCOSE, CAPILLARY
Glucose-Capillary: 107 mg/dL — ABNORMAL HIGH (ref 70–99)
Glucose-Capillary: 121 mg/dL — ABNORMAL HIGH (ref 70–99)
Glucose-Capillary: 134 mg/dL — ABNORMAL HIGH (ref 70–99)
Glucose-Capillary: 137 mg/dL — ABNORMAL HIGH (ref 70–99)
Glucose-Capillary: 154 mg/dL — ABNORMAL HIGH (ref 70–99)
Glucose-Capillary: 171 mg/dL — ABNORMAL HIGH (ref 70–99)
Glucose-Capillary: 179 mg/dL — ABNORMAL HIGH (ref 70–99)

## 2019-05-23 NOTE — Care Management Important Message (Signed)
Important Message  Patient Details IM Letter given to [name] RN to present to the Patient Name: Damon Snyder. MRN: [id number] Date of Birth: 10/18/1927   Medicare Important Message Given:  Yes     Kerin Salen 05/23/2019, 11:42 AM

## 2019-05-23 NOTE — Progress Notes (Signed)
Pharmacy Antibiotic Note  Damon Snyder. is a 85 y.o. male admitted on 05/20/2019 with probable acute cholecystitis. Pharmacy has been consulted for Zosyn dosing.   SCr improved to 1.63 (on 8/27) with CrCl ~41 mL/min  Afebrile  WBC elevated but trending down   Plan:  Continue Zosyn 3.375g IV q8h (each dose infused over 4 hours)  Follow renal function, clinical course, length of therapy   Height: 6\' 4"  (193 cm) Weight: 230 lb 2.6 oz (104.4 kg) IBW/kg (Calculated) : 86.8  Temp (24hrs), Avg:98.2 F (36.8 C), Min:97.9 F (36.6 C), Max:98.6 F (37 C)  Recent Labs  Lab 05/20/19 1345 05/21/19 0435 05/22/19 0340  WBC 17.8* 15.4* 11.3*  CREATININE 2.59* 2.03* 1.63*    Estimated Creatinine Clearance: 40.8 mL/min (A) (by C-G formula based on SCr of 1.63 mg/dL (H)).    No Known Allergies  Antimicrobials this admission: Zosyn 8/25 >>   Microbiology results: 8/25 UCx: NGF  8/25 COVID: negative   Thank you for allowing pharmacy to be a part of this patient's care.   Lindell Spar, PharmD, BCPS Clinical Pharmacist  05/23/2019 1:08 PM

## 2019-05-23 NOTE — TOC Progression Note (Addendum)
Transition of Care (TOC) - Progression Note    Patient Details  Name: Damon Snyder. MRN: [id number] Date of Birth: Sep 16, 1928  Transition of [facility] CM/SW Contact  Damon Mouton, RN Phone Number: 05/23/2019, 1:40 PM  Clinical Narrative:    Spoke with pt and wife concerning Hospice, [facility] was selected and referral given to [facility], [place]. Wife asked for hospital bed.        Expected Discharge Plan and Services                                                 Social Determinants of Health (SDOH) Interventions    Readmission Risk Interventions No flowsheet data found.

## 2019-05-23 NOTE — Progress Notes (Signed)
HEMATOLOGY-ONCOLOGY PROGRESS NOTE  SUBJECTIVE: Damon Snyder continues to have nausea.  He denies pain.  He has "congestion" at the right anterior chest.    PHYSICAL EXAMINATION:  Vitals:   05/22/19 2037 05/23/19 0430  BP: (!) 157/62 (!) 163/65  Pulse: 65 72  Resp: 20 20  Temp: 97.9 F (36.6 C) 98.6 F (37 C)  SpO2: 100% 99%   Filed Weights   05/20/19 2015 05/21/19 0425 05/23/19 0430  Weight: 226 lb 13.7 oz (102.9 kg) 226 lb 10.1 oz (102.8 kg) 230 lb 2.6 oz (104.4 kg)    Intake/Output from previous day: 08/27 0701 - 08/28 0700 In: 545 [P.O.:120; I.V.:375; IV Piggyback:50] Out: 575 [Urine:575]  GENERAL:alert, appears comfortable OROPHARYNX: No thrush or mucositis LUNGS: Expiratory rhonchi at the right posterior base, moves air bilaterally, no respiratory distress HEART: regular rate & rhythm and no murmurs and no lower extremity edema ABDOMEN: Soft, no mass, mild diffuse tenderness  musculoskeletal:no cyanosis of digits and no clubbing  NEURO: alert & oriented x 3 with fluent speech, no focal motor/sensory deficits  LABORATORY DATA:  I have reviewed the data as listed CMP Latest Ref Rng & Units 05/22/2019 05/21/2019 05/20/2019  Glucose 70 - 99 mg/dL 69(L) 69(L) 133(H)  BUN 8 - 23 mg/dL 46(H) 57(H) 61(H)  Creatinine 0.61 - 1.24 mg/dL 1.63(H) 2.03(H) 2.59(H)  Sodium 135 - 145 mmol/L 138 137 135  Potassium 3.5 - 5.1 mmol/L 3.4(L) 4.0 3.6  Chloride 98 - 111 mmol/L 105 104 97(L)  CO2 22 - 32 mmol/L 22 22 22   Calcium 8.9 - 10.3 mg/dL 8.1(L) 8.4(L) 8.6(L)  Total Protein 6.5 - 8.1 g/dL 5.7(L) 6.4(L) 6.8  Total Bilirubin 0.3 - 1.2 mg/dL 1.4(H) 1.6(H) 2.0(H)  Alkaline Phos 38 - 126 U/L 151(H) 162(H) 182(H)  AST 15 - 41 U/L 51(H) 70(H) 60(H)  ALT 0 - 44 U/L 51(H) 57(H) 55(H)    Lab Results  Component Value Date   WBC 11.3 (H) 05/22/2019   HGB 8.4 (L) 05/22/2019   HCT 27.4 (L) 05/22/2019   MCV 91.6 05/22/2019   PLT 212 05/22/2019   NEUTROABS 13.2 (H) 05/21/2019    Ct  Abdomen Pelvis Wo Contrast  Result Date: 05/20/2019 CLINICAL DATA:  Nausea and vomiting. Left upper quadrant abdominal pain. History of recently diagnosed pancreatic cancer. Generalized weakness. Loss of appetite. EXAM: CT ABDOMEN AND PELVIS WITHOUT CONTRAST TECHNIQUE: Multidetector CT imaging of the abdomen and pelvis was performed following the standard protocol without IV contrast. COMPARISON:  05/04/2019 MRI abdomen.  05/03/2019 CT abdomen/pelvis. FINDINGS: Lower chest: Left lower lobe 6 mm pulmonary nodule (series 6/image 35), increased from 4 mm on 05/03/2019 CT. Right middle lobe peripheral 4 mm pulmonary nodule (series 6/image 4), not previously imaged. Mild-to-moderate bibasilar atelectasis. Coronary atherosclerosis. Low cardiac blood pool density indicates anemia. Hepatobiliary: Normal liver size. Numerous (greater than 8) small hypodense liver masses scattered throughout the liver, mildly increased since 05/03/2019 CT. Representative posterior right upper lobe 1.6 cm hypodense mass (series 2/image 12), increased from 1.2 cm. Lateral segment left liver lobe 1.2 cm mass (series 2/image 22), increased from 0.8 cm. Tiny layering calcified gallstones within the mildly distended gallbladder. New mild diffuse gallbladder wall thickening and pericholecystic fat haziness. Scattered pneumobilia within the gallbladder and intrahepatic bile ducts. Well-positioned CBD stent terminating in the duodenal lumen. Pancreas: Known ill-defined pancreatic head mass measuring approximately 4.7 cm (series 2/image 34), not appreciably changed. Stable parenchymal atrophy and duct dilation throughout the pancreatic body and tail. Spleen: Normal size.  No mass. Adrenals/Urinary Tract: Stable 1.0 cm right adrenal adenoma with density 1 HU. No discrete left adrenal nodules. No renal stones. No hydronephrosis. Previously described indeterminate renal cortical lesion in the upper left kidney on 05/04/2019 MRI is non contour deforming  and occult on this noncontrast CT. Bilateral simple renal cysts, largest 4.3 cm in the lateral interpolar right kidney. Normal bladder. Stomach/Bowel: Normal non-distended stomach. Normal caliber small bowel with no small bowel wall thickening. Postsurgical changes from subtotal right hemicolectomy with ileocolic anastomosis in the right abdomen. No large-bowel wall thickening, significant diverticulosis or significant pericolonic fat stranding. Vascular/Lymphatic: Atherosclerotic nonaneurysmal abdominal aorta. Stable mildly enlarged 1.1 cm porta hepatis node (series 2/image 29). No additional pathologically enlarged abdominopelvic nodes. Reproductive: Top-normal size prostate. Other: No pneumoperitoneum, ascites or focal fluid collection. Musculoskeletal: No aggressive appearing focal osseous lesions. Marked thoracolumbar spondylosis. IMPRESSION: 1. Known pancreatic head malignancy is unchanged. Well-positioned CBD stent with pneumobilia in the intrahepatic bile ducts indicating stent patency. 2. Cholelithiasis. New diffuse gallbladder wall thickening and pericholecystic fat stranding. Acute cholecystitis cannot be excluded. 3. Diffuse liver metastases have mildly increased in size. 4. Bilateral pulmonary nodules at the lung bases, mildly increased in size at the left lung base, worrisome for pulmonary metastases. 5. Stable mild porta hepatis adenopathy. 6. Stable right adrenal adenoma. 7. No evidence of bowel obstruction or acute bowel inflammation. 8.  Aortic Atherosclerosis (ICD10-I70.0). Electronically Signed   By: Ilona Sorrel M.D.   On: 05/20/2019 16:04   Ct Abdomen Pelvis Wo Contrast  Result Date: 05/03/2019 CLINICAL DATA:  Transaminitis EXAM: CT ABDOMEN AND PELVIS WITHOUT CONTRAST TECHNIQUE: Multidetector CT imaging of the abdomen and pelvis was performed following the standard protocol without IV contrast. COMPARISON:  None. FINDINGS: Lower chest: There is a partially visualized 6 mm pulmonary nodule in  the left lower lobe (axial series 3, image 1). There is an additional opacity in the left lower lobe measuring approximately 7 mm (axial series 3, image 9). There is mucous plugging and bronchiectasis involving the medial right lung base.The heart size is normal. Hepatobiliary: There are multiple indeterminate hypoattenuating masses in the liver most notably within the right hepatic lobe. These measure up to approximately 1.6 cm. Multiple gallstones versus gallbladder sludge is noted. The common bile duct is mildly dilated measuring up to approximately 9 mm proximally. There appears to be some intrahepatic biliary ductal dilatation. Pancreas: There is an ill-defined soft tissue density at the pancreatic head. This is not well characterized in the absence of IV contrast. This measures approximately 3.5 x 3.8 cm (axial series 3, image 30). Spleen: No splenic laceration or hematoma. Adrenals/Urinary Tract: --Adrenal glands: No adrenal hemorrhage. --Right kidney/ureter: Multiple right-sided renal cysts are noted. There is no hydronephrosis. --Left kidney/ureter: No hydronephrosis or perinephric hematoma. --Urinary bladder: Unremarkable. Stomach/Bowel: --Stomach/Duodenum: There is a small hiatal hernia. --Small bowel: No dilatation or inflammation. --Colon: The patient is status post partial colectomy. A surgical anastomosis is noted at the level of the hepatic flexure. There is no evidence for an obstruction. --Appendix: The appendix is likely surgically absent. Vascular/Lymphatic: Atherosclerotic calcification is present within the non-aneurysmal abdominal aorta, without hemodynamically significant stenosis. --No retroperitoneal lymphadenopathy. --No mesenteric lymphadenopathy. --No pelvic or inguinal lymphadenopathy. Reproductive: Unremarkable Other: No ascites or free air. There is a right fat containing inguinal hernia. Musculoskeletal. There is a somewhat irregular appearance of the inferior endplate of the L3  vertebral body. This is favored to represent an atypical Schmorl's node. Degenerative changes are noted throughout the visualized  lumbar spine. There is grade 1 anterolisthesis of L4 on L5. There is no acute osseous abnormality. IMPRESSION: 1. Distended gallbladder with evidence of gallbladder sludge versus cholelithiasis. There is probable intrahepatic and extrahepatic biliary ductal dilatation which is not well evaluated in the absence of IV contrast. If there is clinical concern for an obstructing process such as choledocholithiasis, follow-up with MRCP/ERCP is recommended. There is no CT evidence for acute cholecystitis. 2. Multiple small hypoattenuating masses throughout the liver as detailed above. These are incompletely characterized on this exam. Follow-up with a contrast-enhanced MRI is recommended. 3. Somewhat ill-defined soft tissue density at the level of the pancreatic head. This is not well evaluated in the absence of IV contrast. Follow-up with a contrast-enhanced MRI is recommended. 4. Status post partial colectomy.  No evidence of an obstruction. 5. Multiple pulmonary nodules in the left lower lobe measuring up to approximately 7 mm. Non-contrast chest CT at 3-6 months is recommended. If the nodules are stable at time of repeat CT, then future CT at 18-24 months (from today's scan) is considered optional for low-risk patients, but is recommended for high-risk patients. This recommendation follows the consensus statement: Guidelines for Management of Incidental Pulmonary Nodules Detected on CT Images: From the Fleischner Society 2017; Radiology 2017; 284:228-243. Electronically Signed   By: Constance Holster M.D.   On: 05/03/2019 18:27   Dg Chest 2 View  Result Date: 05/03/2019 CLINICAL DATA:  Fall. EXAM: CHEST - 2 VIEW COMPARISON:  August 13, 2015 FINDINGS: The heart size and mediastinal contours are within normal limits. Both lungs are clear. The visualized skeletal structures are  unremarkable. IMPRESSION: No active cardiopulmonary disease. Electronically Signed   By: Dorise Bullion III M.D   On: 05/03/2019 14:13   Ct Head Wo Contrast  Result Date: 05/03/2019 CLINICAL DATA:  Weakness with recent fall.  No known head injury. EXAM: CT HEAD WITHOUT CONTRAST TECHNIQUE: Contiguous axial images were obtained from the base of the skull through the vertex without intravenous contrast. COMPARISON:  MRI brain 09/22/2013. FINDINGS: Brain: There is no evidence of acute intracranial hemorrhage, mass lesion, brain edema or extra-axial fluid collection. There is stable atrophy with mild prominence of the ventricles and subarachnoid spaces. There are mild chronic small vessel ischemic changes within the periventricular white matter which are similar to previous MRI. There is no CT evidence of acute cortical infarction. Vascular: Prominent intracranial vascular calcifications. No hyperdense vessel identified. Skull: Negative for fracture or focal lesion. Sinuses/Orbits: The visualized paranasal sinuses and mastoid air cells are clear. No orbital abnormalities are seen. Other: None. IMPRESSION: Stable atrophy and mild chronic small vessel ischemic changes. No acute intracranial findings. Electronically Signed   By: Richardean Sale M.D.   On: 05/03/2019 15:58   Mr 3d Recon At Scanner  Result Date: 05/04/2019 CLINICAL DATA:  Abnormal liver function tests. CT demonstrating possible pancreatic and liver lesions. EXAM: MRI ABDOMEN WITHOUT AND WITH CONTRAST (INCLUDING MRCP) TECHNIQUE: Multiplanar multisequence MR imaging of the abdomen was performed both before and after the administration of intravenous contrast. Heavily T2-weighted images of the biliary and pancreatic ducts were obtained, and three-dimensional MRCP images were rendered by post processing. CONTRAST:  10 cc Gadavist COMPARISON:  CT of 1 day prior FINDINGS: Lower chest: Normal heart size without pericardial or pleural effusion. Tiny hiatal  hernia. Left lower lobe pulmonary nodules including on series 26 are not well evaluated. Hepatobiliary: Bilateral liver lesions which are most consistent with metastasis. Example in the left hepatic lobe at  1.0 cm on 44/20. In the posterior right hepatic lobe at 11 mm on image 35/20. Small gallstones including on 24/6. Gallbladder distension but no specific evidence of acute cholecystitis. Mild intrahepatic biliary duct dilatation. The common duct measures 12 mm on 34/12. No choledocholithiasis. Pancreas: Atrophy with main duct and side branch dilatation throughout the body, neck, tail. This continues to the level of a pancreatic head mass which measures 4.4 x 4.0 cm on image 66/20. No superimposed pancreatitis. Spleen:  Normal in size, without focal abnormality. Adrenals/Urinary Tract: Normal left adrenal gland. Minimal right adrenal nodularity. This consistent with an adenoma of 8 mm on 46/9. Bilateral renal cysts. An upper pole left renal lesion measures 1.8 cm and demonstrates precontrast heterogeneous T1 signal. Equivocal post-contrast enhancement, including on subtracted image 54/25. Stomach/Bowel: Normal distal stomach and abdominal bowel loops. Vascular/Lymphatic: Aortic atherosclerosis. There is involvement of the superior mesenteric vein and splenoportal confluence by tumor, including on 65/20. No abdominal adenopathy. Other:  No ascites.  No evidence of omental or peritoneal disease. Musculoskeletal: No acute osseous abnormality. IMPRESSION: 1. Pancreatic head mass is consistent with adenocarcinoma. This causes biliary and pancreatic duct dilatation. 2. Bilateral hepatic metastasis. 3. Cholelithiasis without evidence of acute cholecystitis. 4. Upper pole left renal lesion is indeterminate and could represent a complex cyst or a mildly enhancing renal cell carcinoma. Of questionable clinical significance given age and comorbidities. 5. Left lung base nodules are suboptimally evaluated. If complete staging  is desired, consider chest CT. 6. Right adrenal adenoma. Electronically Signed   By: Abigail Miyamoto M.D.   On: 05/04/2019 18:14   Dg Chest Port 1 View  Result Date: 05/20/2019 CLINICAL DATA:  Weakness, fall EXAM: PORTABLE CHEST 1 VIEW COMPARISON:  Radiograph May 03, 2019 FINDINGS: Bandlike areas of scarring and/or subsegmental atelectasis seen in both bases, right greater than left. Central vascular crowding. No consolidation, features of edema, pneumothorax, or effusion. Pulmonary vascularity is normally distributed. The aorta is calcified. The remaining cardiomediastinal contours are unremarkable. No acute osseous or soft tissue abnormality. Degenerative changes are present in the imaged spine and shoulders. IMPRESSION: Basilar atelectasis and/or scarring. Otherwise no acute cardiopulmonary or traumatic abnormality Electronically Signed   By: Lovena Le M.D.   On: 05/20/2019 18:22   Dg Knee Complete 4 Views Left  Result Date: 05/03/2019 CLINICAL DATA:  Pain after fall EXAM: LEFT KNEE - COMPLETE 4+ VIEW COMPARISON:  None. FINDINGS: Patient is status post left knee replacement. Hardware is in good position. No fracture or dislocation identified. No effusions. Vascular calcifications noted. IMPRESSION: No acute abnormalities. Electronically Signed   By: Dorise Bullion III M.D   On: 05/03/2019 14:14   Dg Ercp Biliary & Pancreatic Ducts  Result Date: 05/05/2019 CLINICAL DATA:  Obstructive jaundice EXAM: ERCP TECHNIQUE: Multiple spot images obtained with the fluoroscopic device and submitted for interpretation post-procedure. COMPARISON:  MRCP from the previous day FINDINGS: Series of fluoroscopic spot images document endoscopic cannulation and opacification of the CBD. A plastic pancreatic duct stent is noted. There is an occluded segment in the mid CBD, mildly distended proximally, decompressed distally. Limited opacification of the intrahepatic biliary tree. Subsequent images document placement of a  metallic biliary stent across the lesion. IMPRESSION: Endoscopic CBD cannulation and intervention with metallic stent placement. These images were submitted for radiologic interpretation only. Please see the procedural report for the amount of contrast and the fluoroscopy time utilized. Electronically Signed   By: Lucrezia Europe M.D.   On: 05/05/2019 15:13   Dg  Abd 2 Views  Result Date: 05/12/2019 CLINICAL DATA:  Follow-up stent placement EXAM: ABDOMEN - 2 VIEW COMPARISON:  [id number] FINDINGS: Metallic biliary stent is again identified with central narrowing similar to the known underlying mass lesion. The plastic pancreatic stent is no longer identified and has likely passed. Degenerative changes of lumbar spine are seen. No free air is noted. Nonobstructive bowel gas pattern is seen. IMPRESSION: Metallic biliary stent remains in place. Previously placed pancreatic stent has passed. Electronically Signed   By: Inez Catalina M.D.   On: 05/12/2019 12:20   Mr Abdomen Mrcp Moise Boring Contast  Result Date: 05/04/2019 CLINICAL DATA:  Abnormal liver function tests. CT demonstrating possible pancreatic and liver lesions. EXAM: MRI ABDOMEN WITHOUT AND WITH CONTRAST (INCLUDING MRCP) TECHNIQUE: Multiplanar multisequence MR imaging of the abdomen was performed both before and after the administration of intravenous contrast. Heavily T2-weighted images of the biliary and pancreatic ducts were obtained, and three-dimensional MRCP images were rendered by post processing. CONTRAST:  10 cc Gadavist COMPARISON:  CT of 1 day prior FINDINGS: Lower chest: Normal heart size without pericardial or pleural effusion. Tiny hiatal hernia. Left lower lobe pulmonary nodules including on series 26 are not well evaluated. Hepatobiliary: Bilateral liver lesions which are most consistent with metastasis. Example in the left hepatic lobe at 1.0 cm on 44/20. In the posterior right hepatic lobe at 11 mm on image 35/20. Small gallstones including on  24/6. Gallbladder distension but no specific evidence of acute cholecystitis. Mild intrahepatic biliary duct dilatation. The common duct measures 12 mm on 34/12. No choledocholithiasis. Pancreas: Atrophy with main duct and side branch dilatation throughout the body, neck, tail. This continues to the level of a pancreatic head mass which measures 4.4 x 4.0 cm on image 66/20. No superimposed pancreatitis. Spleen:  Normal in size, without focal abnormality. Adrenals/Urinary Tract: Normal left adrenal gland. Minimal right adrenal nodularity. This consistent with an adenoma of 8 mm on 46/9. Bilateral renal cysts. An upper pole left renal lesion measures 1.8 cm and demonstrates precontrast heterogeneous T1 signal. Equivocal post-contrast enhancement, including on subtracted image 54/25. Stomach/Bowel: Normal distal stomach and abdominal bowel loops. Vascular/Lymphatic: Aortic atherosclerosis. There is involvement of the superior mesenteric vein and splenoportal confluence by tumor, including on 65/20. No abdominal adenopathy. Other:  No ascites.  No evidence of omental or peritoneal disease. Musculoskeletal: No acute osseous abnormality. IMPRESSION: 1. Pancreatic head mass is consistent with adenocarcinoma. This causes biliary and pancreatic duct dilatation. 2. Bilateral hepatic metastasis. 3. Cholelithiasis without evidence of acute cholecystitis. 4. Upper pole left renal lesion is indeterminate and could represent a complex cyst or a mildly enhancing renal cell carcinoma. Of questionable clinical significance given age and comorbidities. 5. Left lung base nodules are suboptimally evaluated. If complete staging is desired, consider chest CT. 6. Right adrenal adenoma. Electronically Signed   By: Abigail Miyamoto M.D.   On: 05/04/2019 18:14   Dg Hip Unilat W Or Wo Pelvis 2-3 Views Left  Result Date: 05/03/2019 CLINICAL DATA:  Left hip pain post fall. EXAM: DG HIP (WITH OR WITHOUT PELVIS) 2-3V LEFT COMPARISON:  None.  FINDINGS: There is no evidence of hip fracture or dislocation. Mild osteoarthritic changes of bilateral hips. Vascular calcifications noted. IMPRESSION: No acute fracture or dislocation identified about the left hip. Electronically Signed   By: Fidela Salisbury M.D.   On: 05/03/2019 14:14   US Abdomen Limited Ruq  Result Date: 05/20/2019 CLINICAL DATA:  Cholecystitis EXAM: ULTRASOUND ABDOMEN LIMITED RIGHT UPPER QUADRANT  COMPARISON:  Same day CT FINDINGS: Gallbladder: There is a mixture of biliary sludge and echogenic shadowing gallstones with several hyperechogenic, dirty shadowing foci of intraluminal gas. There is a thickened echogenic gallbladder wall measuring up to 8.4 mm. Small volume of pericholecystic fluid is present.Sonographic Percell Miller sign is reportedly negative. Common bile duct: Diameter: 5.2 mm, not dilated. Biliary stent present in the proximal common bile duct. Liver: Difficult visualization of entire liver parenchyma due to overlying bowel gas. Several echogenic shadowing foci within the liver are likely reflective the pneumatosis seen on prior CT. Portal vein is patent on color Doppler imaging with normal direction of blood flow towards the liver. Other: None. IMPRESSION: Biliary stent in place with mild intrahepatic biliary ductal dilatation and pneumatosis. Cholelithiasis and biliary sludge in with foci of intraluminal gas. Wall thickening and pericholecystic fluid are suggestive of acute cholecystitis though sonographic Percell Miller sign is reportedly negative. Could correlate for history of analgesia which may limit the diagnostic utility of the sonographic Murphy sign. Electronically Signed   By: Lovena Le M.D.   On: 05/20/2019 20:08    ASSESSMENT AND PLAN: 1. Pancreas cancer ? Pancreas head mass, biliary ductal dilatation, left lung nodules, and liver lesions noted on a CT abdomen/pelvis 05/03/2019 ? MRI abdomen 05/04/2019- pancreas mass, biliary and pancreatic duct dilatation, bilateral  hepatic metastases, indeterminate left upper pole renal lesion, left lung base nodules ? ERCP 05/05/2019- duct stricture, placement of a common bile duct stent and duct brushings ? Cytology brushings 05/05/2019-"suspicious "for malignancy 2. Anorexia/weight loss secondary to #1 3.   Pain secondary to #1 4.   Nausea 5.  Diabetes 6.  Right colon cancer, stage III (T4N2, N0) 2003, status post adjuvant chemotherapy 7.  Prostate cancer 8.  05/20/2019-hospital admission for generalized weakness, abdominal discomfort, questionable acute cholecystitis  Damon Snyder appears unchanged.  He would like to go home.  The nursing staff reports he is independent with getting to the bedside commode.  He and his wife live independently and do not have children.  He may be able to go home with hospice care and they may be able to arrange for additional help in the home.  The alternate plan is skilled nursing facility placement.  Recommendations: 1.  Social work consultation with Damon Snyder and his wife to discuss home with hospice/additional help?  Versus nursing facility placement 2.  Consider stopping CBG checks and discontinuing nonessential medications 3.  Antibiotics for possible cholecystitis per the medical and surgical services 4.  Please call oncology over the weekend as needed.  I will plan to follow him with the hospice team if he is discharged home.   LOS: 2 days   Betsy Coder, DNP, AGPCNP-BC, AOCNP 05/23/19

## 2019-05-23 NOTE — Progress Notes (Signed)
Daily Progress Note   Patient Name: Damon Snyder.       Date: 05/23/2019 DOB: [birth year]  Age: 85 y.o. MRN#: [id number] Attending Physician: Donne Hazel, MD Primary Care Physician: Mayra Neer, MD Admit Date: 05/20/2019  Reason for Consultation/Follow-up: Establishing goals of care  Subjective: I met today with Damon Snyder and his wife.  He reports continuing to feel better daily.  I spoke with his wife outside of the room and we talked through her concerns regarding plan for transition home with hospice.  He has been very clear that his desire is to transition home with hospice and is not interested in other discharge plans.  Wife agrees with plan for home with hospice as long as he is willing to get hospital bed.  I made recommendation for hospital bed to him and he was in agreement.  Length of Stay: 2  Current Medications: Scheduled Meds:  . aspirin EC  325 mg Oral BID  . feeding supplement  1 Container Oral BID BM  . feeding supplement (ENSURE ENLIVE)  237 mL Oral BID BM  . heparin  5,000 Units Subcutaneous Q8H  . insulin aspart  0-9 Units Subcutaneous TID WC  . multivitamin with minerals  1 tablet Oral Daily  . nebivolol  20 mg Oral Daily  . senna  1 tablet Oral BID  . sodium chloride flush  3 mL Intravenous Q12H  . tamsulosin  0.4 mg Oral QPC supper    Continuous Infusions: . sodium chloride    . chlorproMAZINE (THORAZINE) IV 25 mg (05/23/19 1256)  . dextrose 50 mL/hr at 05/23/19 0701  . piperacillin-tazobactam (ZOSYN)  IV 3.375 g (05/23/19 1613)    PRN Meds: albuterol, chlorproMAZINE (THORAZINE) IV, diclofenac sodium, ibuprofen, ondansetron **OR** ondansetron (ZOFRAN) IV, oxyCODONE, polyethylene glycol, promethazine, sodium chloride, sodium phosphate, sorbitol  Physical Exam         General: Alert, awake, in no acute distress.  HEENT: No bruits, no goiter, no JVD Heart: Regular rate and rhythm. No murmur appreciated. Lungs: Good air movement, clear Abdomen: Soft, nontender, nondistended, positive bowel sounds.  Ext: No significant edema Skin: Warm and dry Neuro: Grossly intact, nonfocal.  Vital Signs: BP (!) 142/90 (BP Location: Right Arm)   Pulse 71   Temp 98 F (36.7 C) (Oral)   Resp  20   Ht _0  (1.93 m)   Wt 104.4 kg   SpO2 100%   BMI 28.02 kg/m  SpO2: SpO2: 100 % O2 Device: O2 Device: Room Air O2 Flow Rate: O2 Flow Rate (L/min): 2 L/min(placed for comfort per MD order)  Intake/output summary:   Intake/Output Summary (Last 24 hours) at 05/23/2019 2043 Last data filed at 05/23/2019 1748 Gross per 24 hour  Intake 475 ml  Output 725 ml  Net -250 ml   LBM: Last BM Date: 05/23/19 Baseline Weight: Weight: 103 kg Most recent weight: Weight: 104.4 kg       Palliative Assessment/Data:      Patient Active Problem List   Diagnosis Date Noted  . Acute cholecystitis 05/21/2019  . Weakness generalized 05/20/2019  . Pancreatic cancer ([place]) 05/20/2019  . Acute kidney injury superimposed on CKD ([place]) 05/20/2019  . Dehydration 05/20/2019  . Leukocytosis 05/20/2019  . Anemia 05/20/2019  . Transaminitis 05/20/2019  . Cholecystitis   . Pancreatic mass 05/03/2019  . Elevated LFTs 05/03/2019  . Failure to thrive in adult 05/03/2019  . Total knee replacement status 09/14/2016  . Primary osteoarthritis of left knee   . Bilateral leg weakness 05/21/2015  . Atherosclerosis of native arteries of extremity with intermittent claudication ([place]) 11/13/2014  . Pain in lower limb 01/09/2014  . Chronic diastolic heart failure ([place]) 09/23/2013  . Near syncope 09/22/2013  . Essential hypertension, benign 09/22/2013  . Type 2 diabetes mellitus without complication, with long-term current use of insulin ([place]) 09/22/2013  . Pain in joint,  ankle and foot 04/01/2013  . PVD (peripheral vascular disease) ([place]) 12/31/2012  . Onychomycosis due to dermatophyte 12/31/2012    Palliative Care Assessment & Plan   Patient Profile: 85 y.o. male  with past medical history of recently diagnosed pancreatic cancer with metastasis, CBD stent placement, HTN, prior colon and prostate cancer, IDDM, gout, HLP, anemia, arthritis admitted on 05/20/2019 with weakness with concern for acute cholecystis as well as AKI.  Recommendations/Plan:  Plan for transition home with hospice support.  Discussed with his wife and patient and they are in agreement with this plan.  Code Status:    Code Status Orders  (From admission, onward)         Start     Ordered   05/20/19 2015  Do not attempt resuscitation (DNR)  Continuous    Question Answer Comment  In the event of cardiac or respiratory ARREST Do not call a "code blue"   In the event of cardiac or respiratory ARREST Do not perform Intubation, CPR, defibrillation or ACLS   In the event of cardiac or respiratory ARREST Use medication by any route, position, wound care, and other measures to relive pain and suffering. May use oxygen, suction and manual treatment of airway obstruction as needed for comfort.      05/20/19 2015        Code Status History    Date Active Date Inactive Code Status Order ID Comments User Context   05/03/2019 2231 05/07/2019 1750 Full Code [id number]  Truett Mainland, DO Inpatient   09/14/2016 2004 09/16/2016 1611 Full Code [id number]  Leandrew Koyanagi, MD Inpatient   09/22/2013 1610 09/23/2013 2051 Full Code [id number]  Hosie Poisson, MD Inpatient   Advance Care Planning Activity    Advance Directive Documentation     Most Recent Value  Type of Advance Directive  Living will  Pre-existing out of facility DNR order (yellow form or pink MOST form)  -  "  MOST" Form in Place?  -       Prognosis:   < 6 months  Discharge Planning:  Home with Hospice  Care plan was  discussed with patient, wife, RN, Dr. Wyline Copas, and case management  Thank you for allowing the Palliative Medicine Team to assist in the care of this patient.   Time In: 1230 Time Out: 1310 Total Time 40 Prolonged Time Billed NO      Greater than 50%  of this time was spent counseling and coordinating care related to the above assessment and plan.  Micheline Rough, MD  Please contact Palliative Medicine Team phone at [phone] for questions and concerns.

## 2019-05-23 NOTE — Progress Notes (Signed)
Manufacturing engineer [facility]) Hospice  Received request from [place] for family interest in [facility] services at home after discharge. Chart reviewed and eligibility confirmed by [facility] physician. Spoke with spouse to confirm interest, explain services, discuss DME. She is requesting Hospital bed, OBT, BSC to be delivered to home. She is the contact to work with [employer] to coordinate delivery time.   Please send signed and completed out of facility DNR home with patient.  Please send scripts for any medication patient does not have scripts for.   [place] referral center specialist will contact spouse to arrange admission visit in the home.   [facility] hospital liaison will continue to follow until discharge. Please see AMION for daily liaison coverage.   Thank you,  Erling Conte LCSW [phone]

## 2019-05-23 NOTE — Progress Notes (Signed)
PROGRESS NOTE    Damon Snyder.  EH:[id number] DOB: 1928-06-07 DOA: 05/20/2019 PCP: Mayra Neer, MD    Brief Narrative:  85 y.o. male with medical history significant of pancreatic cancer with probable metastatic disease to the liver and lungs, recently diagnosed per ERCP on 05/05/2019 status post common bile duct stent placement, hypertension, prior history of colon cancer status post chemotherapy, history of prostate cancer status post radiation treatment, insulin-dependent diabetes mellitus type 2, gout, hyperlipidemia, anemia, arthritis presented to the ED with sudden onset generalized weakness and dizziness.  Patient stated on day of admission he woke up feeling okay was getting ready to go to his doctor's visit with his oncologist when he felt dizzy and felt his legs gave out and subsequently went down and was unable to get up.  Patient denies any syncopal episode.  Patient does endorse some subjective fevers and chills, episode of nausea and emesis which was brown the night prior to admission after he took his medication, some shortness of breath, constipation, lightheadedness.  Patient denies any diarrhea, no melena, no hematemesis, no hematochezia.  Patient stated he noted he had dark brown stool.  Patient denies any chest pain.  Patient does endorse upper abdominal pain.  Patient subsequently presented to the ED.  Assessment & Plan:   Principal Problem:   Weakness generalized Active Problems:   PVD (peripheral vascular disease) (HCC)   Essential hypertension, benign   Type 2 diabetes mellitus without complication, with long-term current use of insulin (HCC)   Chronic diastolic heart failure (HCC)   Pancreatic mass   Failure to thrive in adult   Pancreatic cancer (HCC)   Acute kidney injury superimposed on CKD (HCC)   Dehydration   Leukocytosis   Anemia   Transaminitis   Acute cholecystitis  1 generalized weakness -Suspect secondary to dehydration in addition to acute  cholecystitis -Cont on hydration as tolerated -PT/OTconsulted, recommendations for home health PT if wife is able to give 24hr supervision. Appreciate assistance by Palliative Care. Disposition planning underway. -Currently stable  2.  Probable acute cholecystitis -Not septic at this time -LFT's and CT abd consistent with acute cholecystitis -Will cont on IV zosyn -General Surgery following. Recommend continued abx and if no improvement, possible perc tube down the road -Pt reports feeling better after bowel movements  3.  Acute kidney injury on chronic kidney disease stage III -Patient with a baseline creatinine from 1.5-1.8.   -Labs reviewed. Cr had improved with hydration  4.  Dehydration -Improved with hydration  5.  Leukocytosis -Secondary to cholecystitis -UA unremarkable -Improved with zosyn  6.  Transaminitis -Likely secondary to metastatic pancreatic cancer.  -Seems stable this AM  7.  Recently diagnosed pancreatic cancer with probable mets to the liver and lungs. -Followed by Dr. Benay Spice -Pt with wishes for no chemo or tx, goal is hospice -Palliative Care following, appreciate input  8.  Insulin-dependent diabetes mellitus type 2 -Last hemoglobin A1c was 6.0 on 05/03/2019.   -Periods of hypoglycemia noted this admit -Have d/c'd lantus. Continue on SSI coverage as needed  9.  Hypertension -Holding nifedipine.   -Continue beta-blocker as tolerated  10.  Hyperlipidemia -Continuing to hold statin -Stable currently  11.  Chronic diastolic heart failure/peripheral vascular disease -Currently euvolemic and stable.   -Continue aspirin, beta-blocker.  -Holding nifedipine for now. Continue to follow  12.  Normocytic anemia -Likely secondary to anemia of chronic disease.   -No evidence of bleeding.  FOBT was negative.   -Remains hemodynamically  stable  DVT prophylaxis: heparin subq Code Status: DNR Family Communication: Pt in room, family not at  bedside Disposition Plan: Uncertain at this time  Consultants:   General Surgery  Palliative Care  Oncology  Procedures:     Antimicrobials: Anti-infectives (From admission, onward)   Start     Dose/Rate Route Frequency Ordered Stop   05/21/19 0600  piperacillin-tazobactam (ZOSYN) IVPB 3.375 g     3.375 g 12.5 mL/hr over 240 Minutes Intravenous Every 8 hours 05/20/19 1820     05/20/19 1830  piperacillin-tazobactam (ZOSYN) IVPB 3.375 g     3.375 g 100 mL/hr over 30 Minutes Intravenous  Once 05/20/19 1819 05/20/19 2344      Subjective: Reports feeling better after bowel movement  Objective: Vitals:   05/22/19 1434 05/22/19 2037 05/23/19 0430 05/23/19 1336  BP: (!) 131/50 (!) 157/62 (!) 163/65 (!) 143/61  Pulse: 65 65 72 63  Resp: 18 20 20 18   Temp: 98 F (36.7 C) 97.9 F (36.6 C) 98.6 F (37 C) 98.2 F (36.8 C)  TempSrc: Oral Oral Oral   SpO2: 100% 100% 99% 100%  Weight:   104.4 kg   Height:        Intake/Output Summary (Last 24 hours) at 05/23/2019 1640 Last data filed at 05/23/2019 [id number] Gross per 24 hour  Intake 475 ml  Output 575 ml  Net -100 ml   Filed Weights   05/20/19 2015 05/21/19 0425 05/23/19 0430  Weight: 102.9 kg 102.8 kg 104.4 kg    Examination: General exam: Conversant, in no acute distress Respiratory system: normal chest rise, clear, no audible wheezing Cardiovascular system: regular rhythm, s1-s2 Gastrointestinal system: Nondistended, nontender, pos BS Central nervous system: No seizures, no tremors Extremities: No cyanosis, no joint deformities Skin: No rashes, no pallor Psychiatry: Affect normal // no auditory hallucinations   Data Reviewed: I have personally reviewed following labs and imaging studies  CBC: Recent Labs  Lab 05/20/19 1345 05/21/19 0435 05/22/19 0340  WBC 17.8* 15.4* 11.3*  NEUTROABS 16.2* 13.2*  --   HGB 9.6* 9.1* 8.4*  HCT 30.4* 29.2* 27.4*  MCV 90.5 92.4 91.6  PLT 229 223 [id number]   Basic Metabolic Panel:  Recent Labs  Lab 05/20/19 1345 05/20/19 2015 05/21/19 0435 05/22/19 0340  NA 135  --  137 138  K 3.6  --  4.0 3.4*  CL 97*  --  104 105  CO2 22  --  22 22  GLUCOSE 133*  --  69* 69*  BUN 61*  --  57* 46*  CREATININE 2.59*  --  2.03* 1.63*  CALCIUM 8.6*  --  8.4* 8.1*  MG  --  2.3  --   --    GFR: Estimated Creatinine Clearance: 40.8 mL/min (A) (by C-G formula based on SCr of 1.63 mg/dL (H)). Liver Function Tests: Recent Labs  Lab 05/20/19 1345 05/21/19 0435 05/22/19 0340  AST 60* 70* 51*  ALT 55* 57* 51*  ALKPHOS 182* 162* 151*  BILITOT 2.0* 1.6* 1.4*  PROT 6.8 6.4* 5.7*  ALBUMIN 2.6* 2.5* 2.1*   No results for input(s): LIPASE, AMYLASE in the last 168 hours. No results for input(s): AMMONIA in the last 168 hours. Coagulation Profile: No results for input(s): INR, PROTIME in the last 168 hours. Cardiac Enzymes: No results for input(s): CKTOTAL, CKMB, CKMBINDEX, TROPONINI in the last 168 hours. BNP (last 3 results) No results for input(s): PROBNP in the last 8760 hours. HbA1C: No results for input(s): HGBA1C in  the last 72 hours. CBG: Recent Labs  Lab 05/23/19 0000 05/23/19 0426 05/23/19 0751 05/23/19 1134 05/23/19 1558  GLUCAP 137* 121* 134* 154* 107*   Lipid Profile: No results for input(s): CHOL, HDL, LDLCALC, TRIG, CHOLHDL, LDLDIRECT in the last 72 hours. Thyroid Function Tests: No results for input(s): TSH, T4TOTAL, FREET4, T3FREE, THYROIDAB in the last 72 hours. Anemia Panel: Recent Labs    05/21/19 0435  VITAMINB12 772  FOLATE 8.4  FERRITIN 1,118*  TIBC 130*  IRON 15*  RETICCTPCT 1.0   Sepsis Labs: No results for input(s): PROCALCITON, LATICACIDVEN in the last 168 hours.  Recent Results (from the past 240 hour(s))  SARS CORONAVIRUS 2 (TAT 6-12 HRS) Nasal Swab Aptima Multi Swab     Status: None   Collection Time: 05/20/19  3:40 PM   Specimen: Aptima Multi Swab; Nasal Swab  Result Value Ref Range Status   SARS Coronavirus 2 NEGATIVE  NEGATIVE Final    Comment: (NOTE) SARS-CoV-2 target nucleic acids are NOT DETECTED. The SARS-CoV-2 RNA is generally detectable in upper and lower respiratory specimens during the acute phase of infection. Negative results do not preclude SARS-CoV-2 infection, do not rule out co-infections with other pathogens, and should not be used as the sole basis for treatment or other patient management decisions. Negative results must be combined with clinical observations, patient history, and epidemiological information. The expected result is Negative. Fact Sheet for Patients: SugarRoll.be Fact Sheet for Healthcare Providers: https://www.woods-mathews.com/ This test is not yet approved or cleared by the [place] FDA and  has been authorized for detection and/or diagnosis of SARS-CoV-2 by FDA under an Emergency Use Authorization (EUA). This EUA will remain  in effect (meaning this test can be used) for the duration of the COVID-19 declaration under Section 56 4(b)(1) of the Act, 21 U.S.C. section 360bbb-3(b)(1), unless the authorization is terminated or revoked sooner. Performed at [facility], [place] [address], [place], [place] [address]   Culture, Urine     Status: None   Collection Time: 05/21/19  4:26 AM   Specimen: Urine, Clean Catch  Result Value Ref Range Status   Specimen Description   Final    URINE, CLEAN CATCH Performed at [facility], [place] [address] [place], [place] [address]    Special Requests   Final    NONE Performed at [facility], [place] [address], [place], [place] [address]    Culture   Final    NO GROWTH Performed at [facility], [place] [address], [place], [place] [address]    Report Status 05/22/2019 FINAL  Final     Radiology Studies: No results found.  Scheduled Meds: . aspirin EC  325 mg Oral BID  . feeding supplement  1 Container Oral BID BM  . feeding  supplement (ENSURE ENLIVE)  237 mL Oral BID BM  . heparin  5,000 Units Subcutaneous Q8H  . insulin aspart  0-9 Units Subcutaneous TID WC  . multivitamin with minerals  1 tablet Oral Daily  . nebivolol  20 mg Oral Daily  . senna  1 tablet Oral BID  . sodium chloride flush  3 mL Intravenous Q12H  . tamsulosin  0.4 mg Oral QPC supper   Continuous Infusions: . sodium chloride    . chlorproMAZINE (THORAZINE) IV 25 mg (05/23/19 1256)  . dextrose 50 mL/hr at 05/23/19 0701  . piperacillin-tazobactam (ZOSYN)  IV 3.375 g (05/23/19 1613)     LOS: 2 days  Marylu Lund, MD [facility] Pager On Amion  If 7PM-7AM, please contact night-coverage 05/23/2019, 4:40 PM

## 2019-05-24 LAB — GLUCOSE, CAPILLARY
Glucose-Capillary: 165 mg/dL — ABNORMAL HIGH (ref 70–99)
Glucose-Capillary: 186 mg/dL — ABNORMAL HIGH (ref 70–99)
Glucose-Capillary: 161 mg/dL — ABNORMAL HIGH (ref 70–99)

## 2019-05-24 MED ORDER — POLYETHYLENE GLYCOL 3350 17 G PO PACK: 17.0000 g | PACK | Freq: Every day | ORAL | 0 refills | Status: AC | PRN

## 2019-05-24 MED ORDER — AMOXICILLIN-POT CLAVULANATE 250-62.5 MG/5ML PO SUSR: 250.0000 mg | Freq: Two times a day (BID) | ORAL | 0 refills | Status: AC

## 2019-05-24 MED ORDER — CHLORPROMAZINE HCL 25 MG PO TABS: 25.0000 mg | ORAL_TABLET | Freq: Three times a day (TID) | ORAL | 0 refills | Status: AC

## 2019-05-24 NOTE — Discharge Summary (Signed)
Physician Discharge Summary  Damon Snyder. EH:[id number] DOB: 17-Sep-1928 DOA: 05/20/2019  PCP: Mayra Neer, MD  Admit date: 05/20/2019 Discharge date: 05/24/2019  Admitted From: Home Disposition:  Home  Recommendations for Outpatient Follow-up:  1. Follow up with home hospice services  Discharge Condition:Stable CODE STATUS:DNR Diet recommendation: Regular as tolerated   Brief/Interim Summary: 85 y.o.malewith medical history significant ofpancreatic cancer with probable metastatic disease to the liver and lungs, recently diagnosed per ERCP on 05/05/2019 status post common bile duct stent placement, hypertension, prior history of colon cancer status post chemotherapy, history of prostate cancer status post radiation treatment, insulin-dependent diabetes mellitus type 2, gout, hyperlipidemia, anemia, arthritis presented to the ED with sudden onset generalized weakness and dizziness. Patient stated on day of admission he woke up feeling okay was getting ready to go to his doctor's visit with his oncologist when he felt dizzy and felt his legs gave out and subsequently went down and was unable to get up. Patient denies any syncopal episode. Patient does endorse some subjective fevers and chills, episode of nausea and emesis which was brown the night prior to admission after he took his medication, some shortness of breath, constipation, lightheadedness. Patient denies any diarrhea, no melena, no hematemesis, no hematochezia. Patient stated he noted he had dark brown stool. Patient denies any chest pain. Patient does endorse upper abdominal pain. Patient subsequently presented to the ED.  Discharge Diagnoses:  Principal Problem:   Weakness generalized Active Problems:   PVD (peripheral vascular disease) (HCC)   Essential hypertension, benign   Type 2 diabetes mellitus without complication, with long-term current use of insulin (HCC)   Chronic diastolic heart failure (HCC)    Pancreatic mass   Failure to thrive in adult   Pancreatic cancer (HCC)   Acute kidney injury superimposed on CKD (HCC)   Dehydration   Leukocytosis   Anemia   Transaminitis   Acute cholecystitis  1 generalized weakness -Suspect secondary to dehydration in addition to acute cholecystitis -Was given gentle hydration -Plan for d/c home with hospice services. -Currently stable  2. Probable acute cholecystitis -Not septic at this time -LFT's and CT abd consistent with acute cholecystitis -Cont on IV zosyn while in hospital, will transition to PO augmentin on d/c. Pt reports some difficulty swallowing pills, therefore will prescribe liquid form -General Surgery following. Recommend continued abx and if no improvement, possible perc tube down the road  -Pt reports feeling better after bowel movements  3. Acute kidney injury on chronic kidney disease stage III -Patient with a baseline creatinine from 1.5-1.8.  -Labs reviewed. Cr had improved with hydration  4. Dehydration -Improved with hydration  5. Leukocytosis -Secondary to cholecystitis -UA unremarkable -Improved with antibiotic  6. Transaminitis -Likely secondary to metastatic pancreatic cancer.  -Seems stable this AM  7. Recently diagnosed pancreatic cancer with probable mets to the liverand lungs. -Followed by Dr. Benay Spice -Pt with wishes for no chemo or tx, goal is hospice -Palliative Care following, appreciate input  8.Insulin-dependent diabetes mellitus type 2 -Last hemoglobin A1c was 6.0 on 05/03/2019.  -Periods of hypoglycemia noted this admit -Have d/c'd lantus.  9. Hypertension -Holding nifedipine.  -Continue beta-blocker as tolerated  10. Hyperlipidemia -Continuing to hold statin -Stable currently  11. Chronic diastolic heart failure/peripheral vascular disease -Currently euvolemic and stable. .  -Holding nifedipine for now. Continue to follow  12. Normocytic  anemia -Likely secondary to anemia of chronic disease.  -No evidence of bleeding. FOBT was negative.  -Remains hemodynamically stable  Discharge Instructions   Allergies as of 05/24/2019   No Known Allergies     Medication List    STOP taking these medications   allopurinol 100 MG tablet Commonly known as: ZYLOPRIM   aspirin EC 325 MG tablet   atorvastatin 20 MG tablet Commonly known as: LIPITOR   Lantus SoloStar 100 UNIT/ML Solostar Pen Generic drug: Insulin Glargine   NIFEdipine 60 MG 24 hr tablet Commonly known as: ADALAT CC   NovoFine 32G X 6 MM Misc Generic drug: Insulin Pen Needle   OneTouch Verio test strip Generic drug: glucose blood     TAKE these medications   amoxicillin-clavulanate 250-62.5 MG/5ML suspension Commonly known as: AUGMENTIN Take 5 mLs (250 mg total) by mouth 2 (two) times daily for 5 days.   Bystolic 20 MG Tabs Generic drug: Nebivolol HCl Take 20 mg by mouth daily.   chlorproMAZINE 25 MG tablet Commonly known as: THORAZINE Take 1 tablet (25 mg total) by mouth 3 (three) times daily.   diclofenac sodium 1 % Gel Commonly known as: VOLTAREN Apply 4 g topically 4 (four) times daily as needed. What changed:   how much to take  reasons to take this   diphenhydrAMINE 25 mg capsule Commonly known as: BENADRYL Take 25 mg by mouth every 6 (six) hours as needed for itching or allergies.   ondansetron 8 MG tablet Commonly known as: ZOFRAN Take 1 tablet (8 mg total) by mouth every 8 (eight) hours as needed for nausea or vomiting.   oxyCODONE 5 MG immediate release tablet Commonly known as: Oxy IR/ROXICODONE Take 1 tablet (5 mg total) by mouth every 4 (four) hours as needed for severe pain.   polyethylene glycol 17 g packet Commonly known as: MIRALAX / GLYCOLAX Take 17 g by mouth daily as needed for mild constipation.   sodium chloride 0.65 % Soln nasal spray Commonly known as: OCEAN Place 1 spray into both nostrils as needed  for congestion.   tamsulosin 0.4 MG Caps capsule Commonly known as: Flomax Take 1 capsule (0.4 mg total) by mouth daily after supper.      Follow-up Information    Ladell Pier, MD. Call.   Specialty: Oncology Why: as schedued Contact information: [place] [address] [phone]          No Known Allergies  Consultations:  Palliative Care  Oncology  Procedures/Studies: Ct Abdomen Pelvis Wo Contrast  Result Date: 05/20/2019 CLINICAL DATA:  Nausea and vomiting. Left upper quadrant abdominal pain. History of recently diagnosed pancreatic cancer. Generalized weakness. Loss of appetite. EXAM: CT ABDOMEN AND PELVIS WITHOUT CONTRAST TECHNIQUE: Multidetector CT imaging of the abdomen and pelvis was performed following the standard protocol without IV contrast. COMPARISON:  05/04/2019 MRI abdomen.  05/03/2019 CT abdomen/pelvis. FINDINGS: Lower chest: Left lower lobe 6 mm pulmonary nodule (series 6/image 35), increased from 4 mm on 05/03/2019 CT. Right middle lobe peripheral 4 mm pulmonary nodule (series 6/image 4), not previously imaged. Mild-to-moderate bibasilar atelectasis. Coronary atherosclerosis. Low cardiac blood pool density indicates anemia. Hepatobiliary: Normal liver size. Numerous (greater than 8) small hypodense liver masses scattered throughout the liver, mildly increased since 05/03/2019 CT. Representative posterior right upper lobe 1.6 cm hypodense mass (series 2/image 12), increased from 1.2 cm. Lateral segment left liver lobe 1.2 cm mass (series 2/image 22), increased from 0.8 cm. Tiny layering calcified gallstones within the mildly distended gallbladder. New mild diffuse gallbladder wall thickening and pericholecystic fat haziness. Scattered pneumobilia within the gallbladder and intrahepatic  bile ducts. Well-positioned CBD stent terminating in the duodenal lumen. Pancreas: Known ill-defined pancreatic head mass measuring approximately 4.7 cm  (series 2/image 34), not appreciably changed. Stable parenchymal atrophy and duct dilation throughout the pancreatic body and tail. Spleen: Normal size. No mass. Adrenals/Urinary Tract: Stable 1.0 cm right adrenal adenoma with density 1 HU. No discrete left adrenal nodules. No renal stones. No hydronephrosis. Previously described indeterminate renal cortical lesion in the upper left kidney on 05/04/2019 MRI is non contour deforming and occult on this noncontrast CT. Bilateral simple renal cysts, largest 4.3 cm in the lateral interpolar right kidney. Normal bladder. Stomach/Bowel: Normal non-distended stomach. Normal caliber small bowel with no small bowel wall thickening. Postsurgical changes from subtotal right hemicolectomy with ileocolic anastomosis in the right abdomen. No large-bowel wall thickening, significant diverticulosis or significant pericolonic fat stranding. Vascular/Lymphatic: Atherosclerotic nonaneurysmal abdominal aorta. Stable mildly enlarged 1.1 cm porta hepatis node (series 2/image 29). No additional pathologically enlarged abdominopelvic nodes. Reproductive: Top-normal size prostate. Other: No pneumoperitoneum, ascites or focal fluid collection. Musculoskeletal: No aggressive appearing focal osseous lesions. Marked thoracolumbar spondylosis. IMPRESSION: 1. Known pancreatic head malignancy is unchanged. Well-positioned CBD stent with pneumobilia in the intrahepatic bile ducts indicating stent patency. 2. Cholelithiasis. New diffuse gallbladder wall thickening and pericholecystic fat stranding. Acute cholecystitis cannot be excluded. 3. Diffuse liver metastases have mildly increased in size. 4. Bilateral pulmonary nodules at the lung bases, mildly increased in size at the left lung base, worrisome for pulmonary metastases. 5. Stable mild porta hepatis adenopathy. 6. Stable right adrenal adenoma. 7. No evidence of bowel obstruction or acute bowel inflammation. 8.  Aortic Atherosclerosis  (ICD10-I70.0). Electronically Signed   By: Ilona Sorrel M.D.   On: 05/20/2019 16:04   Ct Abdomen Pelvis Wo Contrast  Result Date: 05/03/2019 CLINICAL DATA:  Transaminitis EXAM: CT ABDOMEN AND PELVIS WITHOUT CONTRAST TECHNIQUE: Multidetector CT imaging of the abdomen and pelvis was performed following the standard protocol without IV contrast. COMPARISON:  None. FINDINGS: Lower chest: There is a partially visualized 6 mm pulmonary nodule in the left lower lobe (axial series 3, image 1). There is an additional opacity in the left lower lobe measuring approximately 7 mm (axial series 3, image 9). There is mucous plugging and bronchiectasis involving the medial right lung base.The heart size is normal. Hepatobiliary: There are multiple indeterminate hypoattenuating masses in the liver most notably within the right hepatic lobe. These measure up to approximately 1.6 cm. Multiple gallstones versus gallbladder sludge is noted. The common bile duct is mildly dilated measuring up to approximately 9 mm proximally. There appears to be some intrahepatic biliary ductal dilatation. Pancreas: There is an ill-defined soft tissue density at the pancreatic head. This is not well characterized in the absence of IV contrast. This measures approximately 3.5 x 3.8 cm (axial series 3, image 30). Spleen: No splenic laceration or hematoma. Adrenals/Urinary Tract: --Adrenal glands: No adrenal hemorrhage. --Right kidney/ureter: Multiple right-sided renal cysts are noted. There is no hydronephrosis. --Left kidney/ureter: No hydronephrosis or perinephric hematoma. --Urinary bladder: Unremarkable. Stomach/Bowel: --Stomach/Duodenum: There is a small hiatal hernia. --Small bowel: No dilatation or inflammation. --Colon: The patient is status post partial colectomy. A surgical anastomosis is noted at the level of the hepatic flexure. There is no evidence for an obstruction. --Appendix: The appendix is likely surgically absent. Vascular/Lymphatic:  Atherosclerotic calcification is present within the non-aneurysmal abdominal aorta, without hemodynamically significant stenosis. --No retroperitoneal lymphadenopathy. --No mesenteric lymphadenopathy. --No pelvic or inguinal lymphadenopathy. Reproductive: Unremarkable Other: No ascites or free  air. There is a right fat containing inguinal hernia. Musculoskeletal. There is a somewhat irregular appearance of the inferior endplate of the L3 vertebral body. This is favored to represent an atypical Schmorl's node. Degenerative changes are noted throughout the visualized lumbar spine. There is grade 1 anterolisthesis of L4 on L5. There is no acute osseous abnormality. IMPRESSION: 1. Distended gallbladder with evidence of gallbladder sludge versus cholelithiasis. There is probable intrahepatic and extrahepatic biliary ductal dilatation which is not well evaluated in the absence of IV contrast. If there is clinical concern for an obstructing process such as choledocholithiasis, follow-up with MRCP/ERCP is recommended. There is no CT evidence for acute cholecystitis. 2. Multiple small hypoattenuating masses throughout the liver as detailed above. These are incompletely characterized on this exam. Follow-up with a contrast-enhanced MRI is recommended. 3. Somewhat ill-defined soft tissue density at the level of the pancreatic head. This is not well evaluated in the absence of IV contrast. Follow-up with a contrast-enhanced MRI is recommended. 4. Status post partial colectomy.  No evidence of an obstruction. 5. Multiple pulmonary nodules in the left lower lobe measuring up to approximately 7 mm. Non-contrast chest CT at 3-6 months is recommended. If the nodules are stable at time of repeat CT, then future CT at 18-24 months (from today's scan) is considered optional for low-risk patients, but is recommended for high-risk patients. This recommendation follows the consensus statement: Guidelines for Management of Incidental  Pulmonary Nodules Detected on CT Images: From the Fleischner Society 2017; Radiology 2017; 284:[id number]. Electronically Signed   By: Constance Holster M.D.   On: 05/03/2019 18:27   Dg Chest 2 View  Result Date: 05/03/2019 CLINICAL DATA:  Fall. EXAM: CHEST - 2 VIEW COMPARISON:  August 13, 2015 FINDINGS: The heart size and mediastinal contours are within normal limits. Both lungs are clear. The visualized skeletal structures are unremarkable. IMPRESSION: No active cardiopulmonary disease. Electronically Signed   By: Dorise Bullion III M.D   On: 05/03/2019 14:13   Ct Head Wo Contrast  Result Date: 05/03/2019 CLINICAL DATA:  Weakness with recent fall.  No known head injury. EXAM: CT HEAD WITHOUT CONTRAST TECHNIQUE: Contiguous axial images were obtained from the base of the skull through the vertex without intravenous contrast. COMPARISON:  MRI brain 09/22/2013. FINDINGS: Brain: There is no evidence of acute intracranial hemorrhage, mass lesion, brain edema or extra-axial fluid collection. There is stable atrophy with mild prominence of the ventricles and subarachnoid spaces. There are mild chronic small vessel ischemic changes within the periventricular white matter which are similar to previous MRI. There is no CT evidence of acute cortical infarction. Vascular: Prominent intracranial vascular calcifications. No hyperdense vessel identified. Skull: Negative for fracture or focal lesion. Sinuses/Orbits: The visualized paranasal sinuses and mastoid air cells are clear. No orbital abnormalities are seen. Other: None. IMPRESSION: Stable atrophy and mild chronic small vessel ischemic changes. No acute intracranial findings. Electronically Signed   By: Richardean Sale M.D.   On: 05/03/2019 15:58   Mr 3d Recon At Scanner  Result Date: 05/04/2019 CLINICAL DATA:  Abnormal liver function tests. CT demonstrating possible pancreatic and liver lesions. EXAM: MRI ABDOMEN WITHOUT AND WITH CONTRAST (INCLUDING MRCP)  TECHNIQUE: Multiplanar multisequence MR imaging of the abdomen was performed both before and after the administration of intravenous contrast. Heavily T2-weighted images of the biliary and pancreatic ducts were obtained, and three-dimensional MRCP images were rendered by post processing. CONTRAST:  10 cc Gadavist COMPARISON:  CT of 1 day prior FINDINGS: Lower chest:  Normal heart size without pericardial or pleural effusion. Tiny hiatal hernia. Left lower lobe pulmonary nodules including on series 26 are not well evaluated. Hepatobiliary: Bilateral liver lesions which are most consistent with metastasis. Example in the left hepatic lobe at 1.0 cm on 44/20. In the posterior right hepatic lobe at 11 mm on image 35/20. Small gallstones including on 24/6. Gallbladder distension but no specific evidence of acute cholecystitis. Mild intrahepatic biliary duct dilatation. The common duct measures 12 mm on 34/12. No choledocholithiasis. Pancreas: Atrophy with main duct and side branch dilatation throughout the body, neck, tail. This continues to the level of a pancreatic head mass which measures 4.4 x 4.0 cm on image 66/20. No superimposed pancreatitis. Spleen:  Normal in size, without focal abnormality. Adrenals/Urinary Tract: Normal left adrenal gland. Minimal right adrenal nodularity. This consistent with an adenoma of 8 mm on 46/9. Bilateral renal cysts. An upper pole left renal lesion measures 1.8 cm and demonstrates precontrast heterogeneous T1 signal. Equivocal post-contrast enhancement, including on subtracted image 54/25. Stomach/Bowel: Normal distal stomach and abdominal bowel loops. Vascular/Lymphatic: Aortic atherosclerosis. There is involvement of the superior mesenteric vein and splenoportal confluence by tumor, including on 65/20. No abdominal adenopathy. Other:  No ascites.  No evidence of omental or peritoneal disease. Musculoskeletal: No acute osseous abnormality. IMPRESSION: 1. Pancreatic head mass is  consistent with adenocarcinoma. This causes biliary and pancreatic duct dilatation. 2. Bilateral hepatic metastasis. 3. Cholelithiasis without evidence of acute cholecystitis. 4. Upper pole left renal lesion is indeterminate and could represent a complex cyst or a mildly enhancing renal cell carcinoma. Of questionable clinical significance given age and comorbidities. 5. Left lung base nodules are suboptimally evaluated. If complete staging is desired, consider chest CT. 6. Right adrenal adenoma. Electronically Signed   By: Abigail Miyamoto M.D.   On: 05/04/2019 18:14   Dg Chest Port 1 View  Result Date: 05/20/2019 CLINICAL DATA:  Weakness, fall EXAM: PORTABLE CHEST 1 VIEW COMPARISON:  Radiograph May 03, 2019 FINDINGS: Bandlike areas of scarring and/or subsegmental atelectasis seen in both bases, right greater than left. Central vascular crowding. No consolidation, features of edema, pneumothorax, or effusion. Pulmonary vascularity is normally distributed. The aorta is calcified. The remaining cardiomediastinal contours are unremarkable. No acute osseous or soft tissue abnormality. Degenerative changes are present in the imaged spine and shoulders. IMPRESSION: Basilar atelectasis and/or scarring. Otherwise no acute cardiopulmonary or traumatic abnormality Electronically Signed   By: Lovena Le M.D.   On: 05/20/2019 18:22   Dg Knee Complete 4 Views Left  Result Date: 05/03/2019 CLINICAL DATA:  Pain after fall EXAM: LEFT KNEE - COMPLETE 4+ VIEW COMPARISON:  None. FINDINGS: Patient is status post left knee replacement. Hardware is in good position. No fracture or dislocation identified. No effusions. Vascular calcifications noted. IMPRESSION: No acute abnormalities. Electronically Signed   By: Dorise Bullion III M.D   On: 05/03/2019 14:14   Dg Ercp Biliary & Pancreatic Ducts  Result Date: 05/05/2019 CLINICAL DATA:  Obstructive jaundice EXAM: ERCP TECHNIQUE: Multiple spot images obtained with the  fluoroscopic device and submitted for interpretation post-procedure. COMPARISON:  MRCP from the previous day FINDINGS: Series of fluoroscopic spot images document endoscopic cannulation and opacification of the CBD. A plastic pancreatic duct stent is noted. There is an occluded segment in the mid CBD, mildly distended proximally, decompressed distally. Limited opacification of the intrahepatic biliary tree. Subsequent images document placement of a metallic biliary stent across the lesion. IMPRESSION: Endoscopic CBD cannulation and intervention with metallic stent  placement. These images were submitted for radiologic interpretation only. Please see the procedural report for the amount of contrast and the fluoroscopy time utilized. Electronically Signed   By: Lucrezia Europe M.D.   On: 05/05/2019 15:13   Dg Abd 2 Views  Result Date: 05/12/2019 CLINICAL DATA:  Follow-up stent placement EXAM: ABDOMEN - 2 VIEW COMPARISON:  [id number] FINDINGS: Metallic biliary stent is again identified with central narrowing similar to the known underlying mass lesion. The plastic pancreatic stent is no longer identified and has likely passed. Degenerative changes of lumbar spine are seen. No free air is noted. Nonobstructive bowel gas pattern is seen. IMPRESSION: Metallic biliary stent remains in place. Previously placed pancreatic stent has passed. Electronically Signed   By: Inez Catalina M.D.   On: 05/12/2019 12:20   Mr Abdomen Mrcp Moise Boring Contast  Result Date: 05/04/2019 CLINICAL DATA:  Abnormal liver function tests. CT demonstrating possible pancreatic and liver lesions. EXAM: MRI ABDOMEN WITHOUT AND WITH CONTRAST (INCLUDING MRCP) TECHNIQUE: Multiplanar multisequence MR imaging of the abdomen was performed both before and after the administration of intravenous contrast. Heavily T2-weighted images of the biliary and pancreatic ducts were obtained, and three-dimensional MRCP images were rendered by post processing. CONTRAST:  10 cc  Gadavist COMPARISON:  CT of 1 day prior FINDINGS: Lower chest: Normal heart size without pericardial or pleural effusion. Tiny hiatal hernia. Left lower lobe pulmonary nodules including on series 26 are not well evaluated. Hepatobiliary: Bilateral liver lesions which are most consistent with metastasis. Example in the left hepatic lobe at 1.0 cm on 44/20. In the posterior right hepatic lobe at 11 mm on image 35/20. Small gallstones including on 24/6. Gallbladder distension but no specific evidence of acute cholecystitis. Mild intrahepatic biliary duct dilatation. The common duct measures 12 mm on 34/12. No choledocholithiasis. Pancreas: Atrophy with main duct and side branch dilatation throughout the body, neck, tail. This continues to the level of a pancreatic head mass which measures 4.4 x 4.0 cm on image 66/20. No superimposed pancreatitis. Spleen:  Normal in size, without focal abnormality. Adrenals/Urinary Tract: Normal left adrenal gland. Minimal right adrenal nodularity. This consistent with an adenoma of 8 mm on 46/9. Bilateral renal cysts. An upper pole left renal lesion measures 1.8 cm and demonstrates precontrast heterogeneous T1 signal. Equivocal post-contrast enhancement, including on subtracted image 54/25. Stomach/Bowel: Normal distal stomach and abdominal bowel loops. Vascular/Lymphatic: Aortic atherosclerosis. There is involvement of the superior mesenteric vein and splenoportal confluence by tumor, including on 65/20. No abdominal adenopathy. Other:  No ascites.  No evidence of omental or peritoneal disease. Musculoskeletal: No acute osseous abnormality. IMPRESSION: 1. Pancreatic head mass is consistent with adenocarcinoma. This causes biliary and pancreatic duct dilatation. 2. Bilateral hepatic metastasis. 3. Cholelithiasis without evidence of acute cholecystitis. 4. Upper pole left renal lesion is indeterminate and could represent a complex cyst or a mildly enhancing renal cell carcinoma. Of  questionable clinical significance given age and comorbidities. 5. Left lung base nodules are suboptimally evaluated. If complete staging is desired, consider chest CT. 6. Right adrenal adenoma. Electronically Signed   By: Abigail Miyamoto M.D.   On: 05/04/2019 18:14   Dg Hip Unilat W Or Wo Pelvis 2-3 Views Left  Result Date: 05/03/2019 CLINICAL DATA:  Left hip pain post fall. EXAM: DG HIP (WITH OR WITHOUT PELVIS) 2-3V LEFT COMPARISON:  None. FINDINGS: There is no evidence of hip fracture or dislocation. Mild osteoarthritic changes of bilateral hips. Vascular calcifications noted. IMPRESSION: No acute fracture or  dislocation identified about the left hip. Electronically Signed   By: Fidela Salisbury M.D.   On: 05/03/2019 14:14   US Abdomen Limited Ruq  Result Date: 05/20/2019 CLINICAL DATA:  Cholecystitis EXAM: ULTRASOUND ABDOMEN LIMITED RIGHT UPPER QUADRANT COMPARISON:  Same day CT FINDINGS: Gallbladder: There is a mixture of biliary sludge and echogenic shadowing gallstones with several hyperechogenic, dirty shadowing foci of intraluminal gas. There is a thickened echogenic gallbladder wall measuring up to 8.4 mm. Small volume of pericholecystic fluid is present.Sonographic Percell Miller sign is reportedly negative. Common bile duct: Diameter: 5.2 mm, not dilated. Biliary stent present in the proximal common bile duct. Liver: Difficult visualization of entire liver parenchyma due to overlying bowel gas. Several echogenic shadowing foci within the liver are likely reflective the pneumatosis seen on prior CT. Portal vein is patent on color Doppler imaging with normal direction of blood flow towards the liver. Other: None. IMPRESSION: Biliary stent in place with mild intrahepatic biliary ductal dilatation and pneumatosis. Cholelithiasis and biliary sludge in with foci of intraluminal gas. Wall thickening and pericholecystic fluid are suggestive of acute cholecystitis though sonographic Percell Miller sign is reportedly  negative. Could correlate for history of analgesia which may limit the diagnostic utility of the sonographic Murphy sign. Electronically Signed   By: Lovena Le M.D.   On: 05/20/2019 20:08     Subjective: Eager to go home  Discharge Exam: Vitals:   05/23/19 2034 05/24/19 0427  BP: (!) 142/90 (!) 173/67  Pulse: 71 68  Resp: 20 19  Temp: 98 F (36.7 C) 98.2 F (36.8 C)  SpO2: 100% 100%   Vitals:   05/23/19 0430 05/23/19 1336 05/23/19 2034 05/24/19 0427  BP: (!) 163/65 (!) 143/61 (!) 142/90 (!) 173/67  Pulse: 72 63 71 68  Resp: 20 18 20 19   Temp: 98.6 F (37 C) 98.2 F (36.8 C) 98 F (36.7 C) 98.2 F (36.8 C)  TempSrc: Oral  Oral Oral  SpO2: 99% 100% 100% 100%  Weight: 104.4 kg   106 kg  Height:        General: Pt is alert, awake, not in acute distress Cardiovascular: RRR, S1/S2 +, no rubs, no gallops Respiratory: CTA bilaterally, no wheezing, no rhonchi Abdominal: Soft, NT, ND, bowel sounds + Extremities: no edema, no cyanosis   The results of significant diagnostics from this hospitalization (including imaging, microbiology, ancillary and laboratory) are listed below for reference.     Microbiology: Recent Results (from the past 240 hour(s))  SARS CORONAVIRUS 2 (TAT 6-12 HRS) Nasal Swab Aptima Multi Swab     Status: None   Collection Time: 05/20/19  3:40 PM   Specimen: Aptima Multi Swab; Nasal Swab  Result Value Ref Range Status   SARS Coronavirus 2 NEGATIVE NEGATIVE Final    Comment: (NOTE) SARS-CoV-2 target nucleic acids are NOT DETECTED. The SARS-CoV-2 RNA is generally detectable in upper and lower respiratory specimens during the acute phase of infection. Negative results do not preclude SARS-CoV-2 infection, do not rule out co-infections with other pathogens, and should not be used as the sole basis for treatment or other patient management decisions. Negative results must be combined with clinical observations, patient history, and epidemiological  information. The expected result is Negative. Fact Sheet for Patients: SugarRoll.be Fact Sheet for Healthcare Providers: https://www.woods-mathews.com/ This test is not yet approved or cleared by the [place] FDA and  has been authorized for detection and/or diagnosis of SARS-CoV-2 by FDA under an Emergency Use Authorization (EUA). This EUA will  remain  in effect (meaning this test can be used) for the duration of the COVID-19 declaration under Section 56 4(b)(1) of the Act, 21 U.S.C. section 360bbb-3(b)(1), unless the authorization is terminated or revoked sooner. Performed at [facility], [place] [address] [place], [place] [address]   Culture, Urine     Status: None   Collection Time: 05/21/19  4:26 AM   Specimen: Urine, Clean Catch  Result Value Ref Range Status   Specimen Description   Final    URINE, CLEAN CATCH Performed at [facility], [place] [address] [place], [place] [address]    Special Requests   Final    NONE Performed at [facility], [place] [address] [place], [place] [address]    Culture   Final    NO GROWTH Performed at [facility], [place] [address], [place], [place] [address]    Report Status 05/22/2019 FINAL  Final     Labs: BNP (last 3 results) No results for input(s): BNP in the last 8760 hours. Basic Metabolic Panel: Recent Labs  Lab 05/20/19 1345 05/20/19 2015 05/21/19 0435 05/22/19 0340  NA 135  --  137 138  K 3.6  --  4.0 3.4*  CL 97*  --  104 105  CO2 22  --  22 22  GLUCOSE 133*  --  69* 69*  BUN 61*  --  57* 46*  CREATININE 2.59*  --  2.03* 1.63*  CALCIUM 8.6*  --  8.4* 8.1*  MG  --  2.3  --   --    Liver Function Tests: Recent Labs  Lab 05/20/19 1345 05/21/19 0435 05/22/19 0340  AST 60* 70* 51*  ALT 55* 57* 51*  ALKPHOS 182* 162* 151*  BILITOT 2.0* 1.6* 1.4*  PROT 6.8 6.4* 5.7*  ALBUMIN 2.6* 2.5* 2.1*   No results for input(s):  LIPASE, AMYLASE in the last 168 hours. No results for input(s): AMMONIA in the last 168 hours. CBC: Recent Labs  Lab 05/20/19 1345 05/21/19 0435 05/22/19 0340  WBC 17.8* 15.4* 11.3*  NEUTROABS 16.2* 13.2*  --   HGB 9.6* 9.1* 8.4*  HCT 30.4* 29.2* 27.4*  MCV 90.5 92.4 91.6  PLT 229 223 212   Cardiac Enzymes: No results for input(s): CKTOTAL, CKMB, CKMBINDEX, TROPONINI in the last 168 hours. BNP: Invalid input(s): POCBNP CBG: Recent Labs  Lab 05/23/19 2037 05/23/19 2343 05/24/19 0430 05/24/19 0804 05/24/19 1152  GLUCAP 179* 171* 165* 186* 161*   D-Dimer No results for input(s): DDIMER in the last 72 hours. Hgb A1c No results for input(s): HGBA1C in the last 72 hours. Lipid Profile No results for input(s): CHOL, HDL, LDLCALC, TRIG, CHOLHDL, LDLDIRECT in the last 72 hours. Thyroid function studies No results for input(s): TSH, T4TOTAL, T3FREE, THYROIDAB in the last 72 hours.  Invalid input(s): FREET3 Anemia work up No results for input(s): VITAMINB12, FOLATE, FERRITIN, TIBC, IRON, RETICCTPCT in the last 72 hours. Urinalysis    Component Value Date/Time   COLORURINE YELLOW 05/21/2019 0426   APPEARANCEUR HAZY (A) 05/21/2019 0426   LABSPEC 1.015 05/21/2019 0426   PHURINE 5.0 05/21/2019 0426   GLUCOSEU NEGATIVE 05/21/2019 0426   HGBUR NEGATIVE 05/21/2019 0426   BILIRUBINUR NEGATIVE 05/21/2019 0426   KETONESUR NEGATIVE 05/21/2019 0426   PROTEINUR NEGATIVE 05/21/2019 0426   UROBILINOGEN 0.2 09/22/2013 1519   NITRITE NEGATIVE 05/21/2019 0426   LEUKOCYTESUR NEGATIVE 05/21/2019 0426   Sepsis Labs Invalid input(s): PROCALCITONIN,  WBC,  LACTICIDVEN  Microbiology Recent Results (from the past 240 hour(s))  SARS CORONAVIRUS 2 (TAT 6-12 HRS) Nasal Swab Aptima Multi Swab     Status: None   Collection Time: 05/20/19  3:40 PM   Specimen: Aptima Multi Swab; Nasal Swab  Result Value Ref Range Status   SARS Coronavirus 2 NEGATIVE NEGATIVE Final    Comment: (NOTE) SARS-CoV-2  target nucleic acids are NOT DETECTED. The SARS-CoV-2 RNA is generally detectable in upper and lower respiratory specimens during the acute phase of infection. Negative results do not preclude SARS-CoV-2 infection, do not rule out co-infections with other pathogens, and should not be used as the sole basis for treatment or other patient management decisions. Negative results must be combined with clinical observations, patient history, and epidemiological information. The expected result is Negative. Fact Sheet for Patients: SugarRoll.be Fact Sheet for Healthcare Providers: https://www.woods-mathews.com/ This test is not yet approved or cleared by the [place] FDA and  has been authorized for detection and/or diagnosis of SARS-CoV-2 by FDA under an Emergency Use Authorization (EUA). This EUA will remain  in effect (meaning this test can be used) for the duration of the COVID-19 declaration under Section 56 4(b)(1) of the Act, 21 U.S.C. section 360bbb-3(b)(1), unless the authorization is terminated or revoked sooner. Performed at [facility], [place] [address], [place], [place] [address]   Culture, Urine     Status: None   Collection Time: 05/21/19  4:26 AM   Specimen: Urine, Clean Catch  Result Value Ref Range Status   Specimen Description   Final    URINE, CLEAN CATCH Performed at [facility], [place] [address] [place], [place] [address]    Special Requests   Final    NONE Performed at [facility], [place] [address], [place], [place] [address]    Culture   Final    NO GROWTH Performed at [facility], [place] [address], [place], [place] [address]    Report Status 05/22/2019 FINAL  Final   Time spent: 72min  SIGNED:   Marylu Lund, MD  [facility] 05/24/2019, 1:29 PM  If 7PM-7AM, please contact night-coverage

## 2019-05-24 NOTE — Progress Notes (Addendum)
Manufacturing engineer [facility] Hospice  Damon Snyder called to confirm delivery time of DME today.  It will be delivered between 11-12.  [facility] has scheduled to admit him to hospice services on Sunday @ 3pm.  They have our contact information should they need anything prior to Sunday.  Please send any medications prescriptions needed for comfort (if needed) until hospice can begin services on Monday.  Thank you, Venia Carbon RN, BSN, [facility] Liaison (in [phone]  **DME delivered, Damon Snyder is ready for Damon Snyder to be discharged.  She advised that she called the RN to have transport home arranged

## 2019-05-25 DIAGNOSIS — Z6828 Body mass index (BMI) 28.0-28.9, adult: Secondary | ICD-10-CM | POA: Diagnosis not present

## 2019-05-25 DIAGNOSIS — N183 Chronic kidney disease, stage 3 (moderate): Secondary | ICD-10-CM | POA: Diagnosis not present

## 2019-05-25 DIAGNOSIS — I129 Hypertensive chronic kidney disease with stage 1 through stage 4 chronic kidney disease, or unspecified chronic kidney disease: Secondary | ICD-10-CM | POA: Diagnosis not present

## 2019-05-25 DIAGNOSIS — K8051 Calculus of bile duct without cholangitis or cholecystitis with obstruction: Secondary | ICD-10-CM | POA: Diagnosis not present

## 2019-05-25 DIAGNOSIS — J302 Other seasonal allergic rhinitis: Secondary | ICD-10-CM | POA: Diagnosis not present

## 2019-05-25 DIAGNOSIS — M109 Gout, unspecified: Secondary | ICD-10-CM | POA: Diagnosis not present

## 2019-05-25 DIAGNOSIS — C787 Secondary malignant neoplasm of liver and intrahepatic bile duct: Secondary | ICD-10-CM | POA: Diagnosis not present

## 2019-05-25 DIAGNOSIS — E785 Hyperlipidemia, unspecified: Secondary | ICD-10-CM | POA: Diagnosis not present

## 2019-05-25 DIAGNOSIS — C7800 Secondary malignant neoplasm of unspecified lung: Secondary | ICD-10-CM | POA: Diagnosis not present

## 2019-05-25 DIAGNOSIS — Z85038 Personal history of other malignant neoplasm of large intestine: Secondary | ICD-10-CM | POA: Diagnosis not present

## 2019-05-25 DIAGNOSIS — E119 Type 2 diabetes mellitus without complications: Secondary | ICD-10-CM | POA: Diagnosis not present

## 2019-05-25 DIAGNOSIS — Z794 Long term (current) use of insulin: Secondary | ICD-10-CM | POA: Diagnosis not present

## 2019-05-25 DIAGNOSIS — C250 Malignant neoplasm of head of pancreas: Secondary | ICD-10-CM | POA: Diagnosis not present

## 2019-05-25 DIAGNOSIS — Z8546 Personal history of malignant neoplasm of prostate: Secondary | ICD-10-CM | POA: Diagnosis not present

## 2019-05-25 DIAGNOSIS — D630 Anemia in neoplastic disease: Secondary | ICD-10-CM | POA: Diagnosis not present

## 2019-05-25 DIAGNOSIS — N400 Enlarged prostate without lower urinary tract symptoms: Secondary | ICD-10-CM | POA: Diagnosis not present

## 2019-05-26 ENCOUNTER — Telehealth: Payer: Self-pay

## 2019-05-26 DIAGNOSIS — D630 Anemia in neoplastic disease: Secondary | ICD-10-CM | POA: Diagnosis not present

## 2019-05-26 DIAGNOSIS — I129 Hypertensive chronic kidney disease with stage 1 through stage 4 chronic kidney disease, or unspecified chronic kidney disease: Secondary | ICD-10-CM | POA: Diagnosis not present

## 2019-05-26 DIAGNOSIS — C250 Malignant neoplasm of head of pancreas: Secondary | ICD-10-CM | POA: Diagnosis not present

## 2019-05-26 DIAGNOSIS — K8051 Calculus of bile duct without cholangitis or cholecystitis with obstruction: Secondary | ICD-10-CM | POA: Diagnosis not present

## 2019-05-26 DIAGNOSIS — C787 Secondary malignant neoplasm of liver and intrahepatic bile duct: Secondary | ICD-10-CM | POA: Diagnosis not present

## 2019-05-26 DIAGNOSIS — C7800 Secondary malignant neoplasm of unspecified lung: Secondary | ICD-10-CM | POA: Diagnosis not present

## 2019-05-26 NOTE — Telephone Encounter (Signed)
TC from scheduling in reference to Biopsy on Mr. Hall Busing per Dr. Benay Spice cancel Biopsy.

## 2019-05-27 DIAGNOSIS — Z6828 Body mass index (BMI) 28.0-28.9, adult: Secondary | ICD-10-CM | POA: Diagnosis not present

## 2019-05-27 DIAGNOSIS — M109 Gout, unspecified: Secondary | ICD-10-CM | POA: Diagnosis not present

## 2019-05-27 DIAGNOSIS — D630 Anemia in neoplastic disease: Secondary | ICD-10-CM | POA: Diagnosis not present

## 2019-05-27 DIAGNOSIS — N183 Chronic kidney disease, stage 3 (moderate): Secondary | ICD-10-CM | POA: Diagnosis not present

## 2019-05-27 DIAGNOSIS — N400 Enlarged prostate without lower urinary tract symptoms: Secondary | ICD-10-CM | POA: Diagnosis not present

## 2019-05-27 DIAGNOSIS — Z85038 Personal history of other malignant neoplasm of large intestine: Secondary | ICD-10-CM | POA: Diagnosis not present

## 2019-05-27 DIAGNOSIS — Z794 Long term (current) use of insulin: Secondary | ICD-10-CM | POA: Diagnosis not present

## 2019-05-27 DIAGNOSIS — C7800 Secondary malignant neoplasm of unspecified lung: Secondary | ICD-10-CM | POA: Diagnosis not present

## 2019-05-27 DIAGNOSIS — Z8546 Personal history of malignant neoplasm of prostate: Secondary | ICD-10-CM | POA: Diagnosis not present

## 2019-05-27 DIAGNOSIS — K8051 Calculus of bile duct without cholangitis or cholecystitis with obstruction: Secondary | ICD-10-CM | POA: Diagnosis not present

## 2019-05-27 DIAGNOSIS — E785 Hyperlipidemia, unspecified: Secondary | ICD-10-CM | POA: Diagnosis not present

## 2019-05-27 DIAGNOSIS — J302 Other seasonal allergic rhinitis: Secondary | ICD-10-CM | POA: Diagnosis not present

## 2019-05-27 DIAGNOSIS — I129 Hypertensive chronic kidney disease with stage 1 through stage 4 chronic kidney disease, or unspecified chronic kidney disease: Secondary | ICD-10-CM | POA: Diagnosis not present

## 2019-05-27 DIAGNOSIS — C250 Malignant neoplasm of head of pancreas: Secondary | ICD-10-CM | POA: Diagnosis not present

## 2019-05-27 DIAGNOSIS — C787 Secondary malignant neoplasm of liver and intrahepatic bile duct: Secondary | ICD-10-CM | POA: Diagnosis not present

## 2019-05-27 DIAGNOSIS — E119 Type 2 diabetes mellitus without complications: Secondary | ICD-10-CM | POA: Diagnosis not present

## 2019-05-28 DIAGNOSIS — C7800 Secondary malignant neoplasm of unspecified lung: Secondary | ICD-10-CM | POA: Diagnosis not present

## 2019-05-28 DIAGNOSIS — D630 Anemia in neoplastic disease: Secondary | ICD-10-CM | POA: Diagnosis not present

## 2019-05-28 DIAGNOSIS — I129 Hypertensive chronic kidney disease with stage 1 through stage 4 chronic kidney disease, or unspecified chronic kidney disease: Secondary | ICD-10-CM | POA: Diagnosis not present

## 2019-05-28 DIAGNOSIS — C787 Secondary malignant neoplasm of liver and intrahepatic bile duct: Secondary | ICD-10-CM | POA: Diagnosis not present

## 2019-05-28 DIAGNOSIS — C250 Malignant neoplasm of head of pancreas: Secondary | ICD-10-CM | POA: Diagnosis not present

## 2019-05-28 DIAGNOSIS — K8051 Calculus of bile duct without cholangitis or cholecystitis with obstruction: Secondary | ICD-10-CM | POA: Diagnosis not present

## 2019-05-29 DIAGNOSIS — D630 Anemia in neoplastic disease: Secondary | ICD-10-CM | POA: Diagnosis not present

## 2019-05-29 DIAGNOSIS — K8051 Calculus of bile duct without cholangitis or cholecystitis with obstruction: Secondary | ICD-10-CM | POA: Diagnosis not present

## 2019-05-29 DIAGNOSIS — C7800 Secondary malignant neoplasm of unspecified lung: Secondary | ICD-10-CM | POA: Diagnosis not present

## 2019-05-29 DIAGNOSIS — C787 Secondary malignant neoplasm of liver and intrahepatic bile duct: Secondary | ICD-10-CM | POA: Diagnosis not present

## 2019-05-29 DIAGNOSIS — I129 Hypertensive chronic kidney disease with stage 1 through stage 4 chronic kidney disease, or unspecified chronic kidney disease: Secondary | ICD-10-CM | POA: Diagnosis not present

## 2019-05-29 DIAGNOSIS — C250 Malignant neoplasm of head of pancreas: Secondary | ICD-10-CM | POA: Diagnosis not present

## 2019-05-30 ENCOUNTER — Inpatient Hospital Stay: Payer: Medicare Other | Attending: Oncology | Admitting: Nurse Practitioner

## 2019-06-03 DIAGNOSIS — C787 Secondary malignant neoplasm of liver and intrahepatic bile duct: Secondary | ICD-10-CM | POA: Diagnosis not present

## 2019-06-03 DIAGNOSIS — D630 Anemia in neoplastic disease: Secondary | ICD-10-CM | POA: Diagnosis not present

## 2019-06-03 DIAGNOSIS — K8051 Calculus of bile duct without cholangitis or cholecystitis with obstruction: Secondary | ICD-10-CM | POA: Diagnosis not present

## 2019-06-03 DIAGNOSIS — I129 Hypertensive chronic kidney disease with stage 1 through stage 4 chronic kidney disease, or unspecified chronic kidney disease: Secondary | ICD-10-CM | POA: Diagnosis not present

## 2019-06-03 DIAGNOSIS — C250 Malignant neoplasm of head of pancreas: Secondary | ICD-10-CM | POA: Diagnosis not present

## 2019-06-03 DIAGNOSIS — C7800 Secondary malignant neoplasm of unspecified lung: Secondary | ICD-10-CM | POA: Diagnosis not present

## 2019-06-05 DIAGNOSIS — D630 Anemia in neoplastic disease: Secondary | ICD-10-CM | POA: Diagnosis not present

## 2019-06-05 DIAGNOSIS — K8051 Calculus of bile duct without cholangitis or cholecystitis with obstruction: Secondary | ICD-10-CM | POA: Diagnosis not present

## 2019-06-05 DIAGNOSIS — C250 Malignant neoplasm of head of pancreas: Secondary | ICD-10-CM | POA: Diagnosis not present

## 2019-06-05 DIAGNOSIS — I129 Hypertensive chronic kidney disease with stage 1 through stage 4 chronic kidney disease, or unspecified chronic kidney disease: Secondary | ICD-10-CM | POA: Diagnosis not present

## 2019-06-05 DIAGNOSIS — C787 Secondary malignant neoplasm of liver and intrahepatic bile duct: Secondary | ICD-10-CM | POA: Diagnosis not present

## 2019-06-05 DIAGNOSIS — C7800 Secondary malignant neoplasm of unspecified lung: Secondary | ICD-10-CM | POA: Diagnosis not present

## 2019-06-09 DIAGNOSIS — I5030 Unspecified diastolic (congestive) heart failure: Secondary | ICD-10-CM | POA: Diagnosis not present

## 2019-06-09 DIAGNOSIS — D630 Anemia in neoplastic disease: Secondary | ICD-10-CM | POA: Diagnosis not present

## 2019-06-09 DIAGNOSIS — I129 Hypertensive chronic kidney disease with stage 1 through stage 4 chronic kidney disease, or unspecified chronic kidney disease: Secondary | ICD-10-CM | POA: Diagnosis not present

## 2019-06-09 DIAGNOSIS — E1122 Type 2 diabetes mellitus with diabetic chronic kidney disease: Secondary | ICD-10-CM | POA: Diagnosis not present

## 2019-06-09 DIAGNOSIS — K8051 Calculus of bile duct without cholangitis or cholecystitis with obstruction: Secondary | ICD-10-CM | POA: Diagnosis not present

## 2019-06-09 DIAGNOSIS — Z1389 Encounter for screening for other disorder: Secondary | ICD-10-CM | POA: Diagnosis not present

## 2019-06-09 DIAGNOSIS — N183 Chronic kidney disease, stage 3 (moderate): Secondary | ICD-10-CM | POA: Diagnosis not present

## 2019-06-09 DIAGNOSIS — I779 Disorder of arteries and arterioles, unspecified: Secondary | ICD-10-CM | POA: Diagnosis not present

## 2019-06-09 DIAGNOSIS — I130 Hypertensive heart and chronic kidney disease with heart failure and stage 1 through stage 4 chronic kidney disease, or unspecified chronic kidney disease: Secondary | ICD-10-CM | POA: Diagnosis not present

## 2019-06-09 DIAGNOSIS — C250 Malignant neoplasm of head of pancreas: Secondary | ICD-10-CM | POA: Diagnosis not present

## 2019-06-09 DIAGNOSIS — C7800 Secondary malignant neoplasm of unspecified lung: Secondary | ICD-10-CM | POA: Diagnosis not present

## 2019-06-09 DIAGNOSIS — Z0000 Encounter for general adult medical examination without abnormal findings: Secondary | ICD-10-CM | POA: Diagnosis not present

## 2019-06-09 DIAGNOSIS — Z85038 Personal history of other malignant neoplasm of large intestine: Secondary | ICD-10-CM | POA: Diagnosis not present

## 2019-06-09 DIAGNOSIS — C259 Malignant neoplasm of pancreas, unspecified: Secondary | ICD-10-CM | POA: Diagnosis not present

## 2019-06-09 DIAGNOSIS — C787 Secondary malignant neoplasm of liver and intrahepatic bile duct: Secondary | ICD-10-CM | POA: Diagnosis not present

## 2019-06-10 DIAGNOSIS — D630 Anemia in neoplastic disease: Secondary | ICD-10-CM | POA: Diagnosis not present

## 2019-06-10 DIAGNOSIS — C7800 Secondary malignant neoplasm of unspecified lung: Secondary | ICD-10-CM | POA: Diagnosis not present

## 2019-06-10 DIAGNOSIS — K8051 Calculus of bile duct without cholangitis or cholecystitis with obstruction: Secondary | ICD-10-CM | POA: Diagnosis not present

## 2019-06-10 DIAGNOSIS — I129 Hypertensive chronic kidney disease with stage 1 through stage 4 chronic kidney disease, or unspecified chronic kidney disease: Secondary | ICD-10-CM | POA: Diagnosis not present

## 2019-06-10 DIAGNOSIS — C250 Malignant neoplasm of head of pancreas: Secondary | ICD-10-CM | POA: Diagnosis not present

## 2019-06-10 DIAGNOSIS — C787 Secondary malignant neoplasm of liver and intrahepatic bile duct: Secondary | ICD-10-CM | POA: Diagnosis not present

## 2019-06-12 DIAGNOSIS — D630 Anemia in neoplastic disease: Secondary | ICD-10-CM | POA: Diagnosis not present

## 2019-06-12 DIAGNOSIS — C787 Secondary malignant neoplasm of liver and intrahepatic bile duct: Secondary | ICD-10-CM | POA: Diagnosis not present

## 2019-06-12 DIAGNOSIS — K8051 Calculus of bile duct without cholangitis or cholecystitis with obstruction: Secondary | ICD-10-CM | POA: Diagnosis not present

## 2019-06-12 DIAGNOSIS — C250 Malignant neoplasm of head of pancreas: Secondary | ICD-10-CM | POA: Diagnosis not present

## 2019-06-12 DIAGNOSIS — I129 Hypertensive chronic kidney disease with stage 1 through stage 4 chronic kidney disease, or unspecified chronic kidney disease: Secondary | ICD-10-CM | POA: Diagnosis not present

## 2019-06-12 DIAGNOSIS — C7800 Secondary malignant neoplasm of unspecified lung: Secondary | ICD-10-CM | POA: Diagnosis not present

## 2019-06-15 DIAGNOSIS — I129 Hypertensive chronic kidney disease with stage 1 through stage 4 chronic kidney disease, or unspecified chronic kidney disease: Secondary | ICD-10-CM | POA: Diagnosis not present

## 2019-06-15 DIAGNOSIS — K8051 Calculus of bile duct without cholangitis or cholecystitis with obstruction: Secondary | ICD-10-CM | POA: Diagnosis not present

## 2019-06-15 DIAGNOSIS — D630 Anemia in neoplastic disease: Secondary | ICD-10-CM | POA: Diagnosis not present

## 2019-06-15 DIAGNOSIS — C250 Malignant neoplasm of head of pancreas: Secondary | ICD-10-CM | POA: Diagnosis not present

## 2019-06-15 DIAGNOSIS — C787 Secondary malignant neoplasm of liver and intrahepatic bile duct: Secondary | ICD-10-CM | POA: Diagnosis not present

## 2019-06-15 DIAGNOSIS — C7800 Secondary malignant neoplasm of unspecified lung: Secondary | ICD-10-CM | POA: Diagnosis not present

## 2019-06-26 DEATH — deceased

## 2019-09-04 ENCOUNTER — Telehealth: Payer: Self-pay | Admitting: Family

## 2020-08-08 IMAGING — MR MR ABDOMEN WO/W CM MRCP
18 of 23 series · 43 of 48 positions shown · IV contrast (Gadavist)
Comparison: CT of 1 day prior

CLINICAL DATA: Abnormal liver function tests. CT demonstrating
possible pancreatic and liver lesions.

EXAM:
MRI ABDOMEN WITHOUT AND WITH CONTRAST (INCLUDING MRCP)
TECHNIQUE: Multiplanar multisequence MR imaging of the abdomen was performed
both before and after the administration of intravenous contrast.
Heavily T2-weighted images of the biliary and pancreatic ducts were
obtained, and three-dimensional MRCP images were rendered by post
processing.
CONTRAST:  10 cc Gadavist

[Series 4: ax haste · axial · 6.0mm · 1.19mm/px · 1 of 42 slices shown]
[im 1/42]
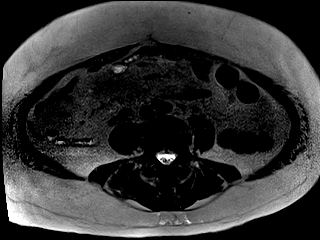

[Series 5: bSSFP · coronal · 6.0mm · 0.74mm/px · 1 of 34 slices shown]
[im 1/34]
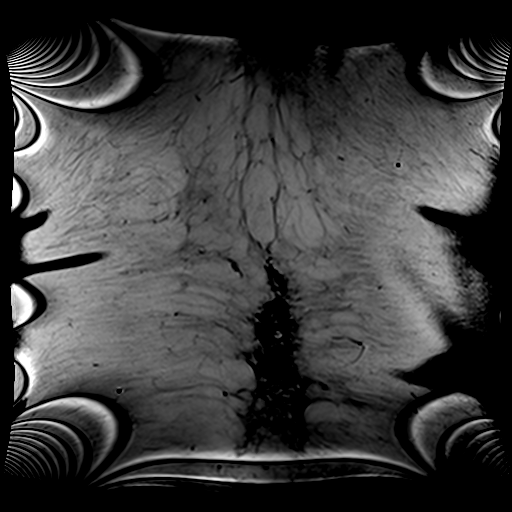

[Series 6: T2 fat-sat · axial · 6.0mm · 1.19mm/px · 1 of 42 slices shown]
[im 1/42]
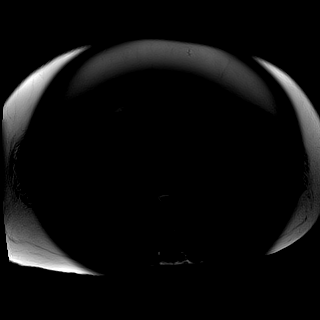

[Series 7: DWI · axial · 6.0mm · 1.42mm/px · z∈[-239,+28]mm · 3 of 114 slices shown (1 of 2)]
[im 1/114]
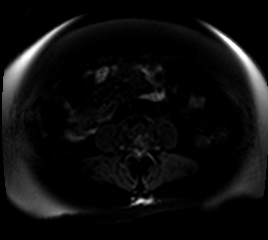
[im 57/114]
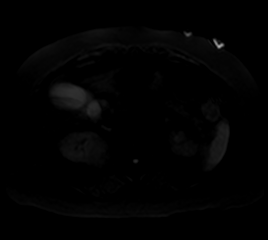
[im 114/114]
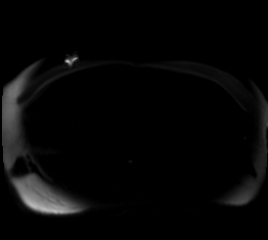

[Series 8: DWI · axial · 6.0mm · 1.42mm/px · 1 of 38 slices shown (2 of 2)]
[im 1/38]
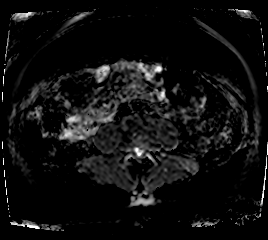

[Series 9: ax in and · axial · 3.0mm · 1.19mm/px · z∈[-236,+25]mm · 5 of 176 slices shown]
[im 1/176]
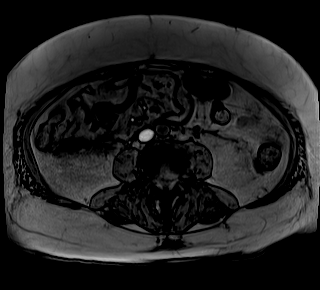
[im 44/176]
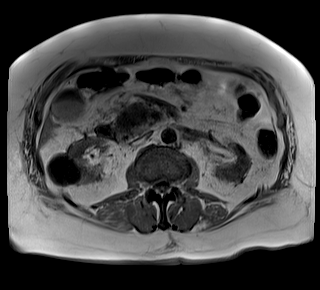
[im 88/176]
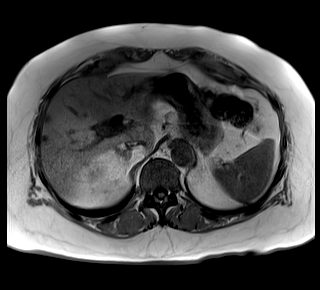
[im 132/176]
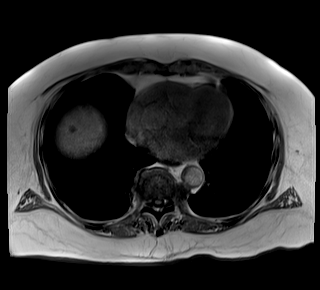
[im 176/176]
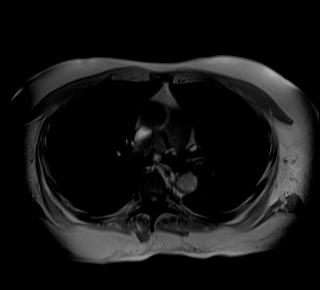

[Series 14: MRCP · coronal · 4.0mm · 1.12mm/px · 1 of 18 slices shown]
[im 1/18]
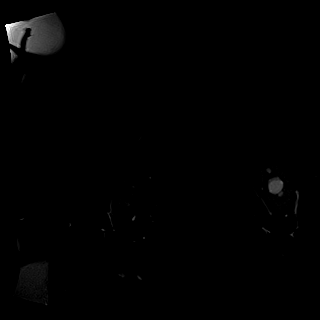

[Series 15: radials · coronal · 50.0mm · 0.78mm/px · 1 of 5 slices shown]
[im 1/5]
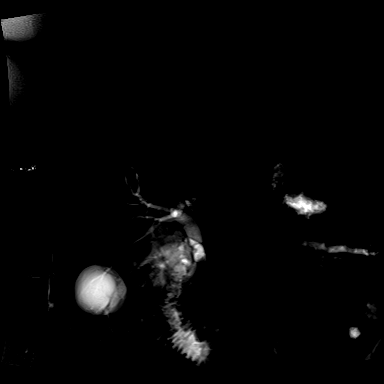

[Series 16: T1 dynamic · axial · non-contrast · 3.0mm · 1.19mm/px · z∈[-236,+25]mm · 2 of 88 slices shown (1 of 5)]
[im 1/88]
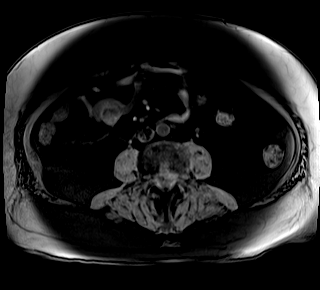
[im 88/88]
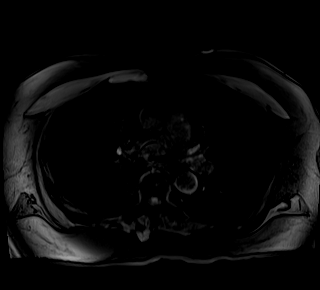

[Series 18: T1 dynamic post-contrast · axial · 3.0mm · 1.19mm/px · z∈[-236,+25]mm · 3 of 88 slices shown (1 of 5)]
[im 1/88]
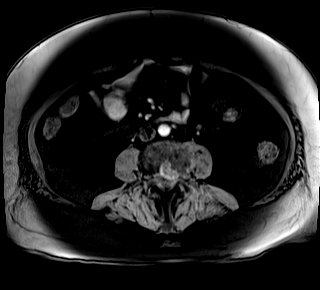
[im 44/88]
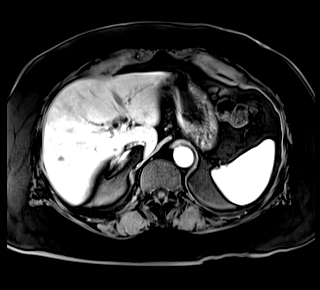
[im 88/88]
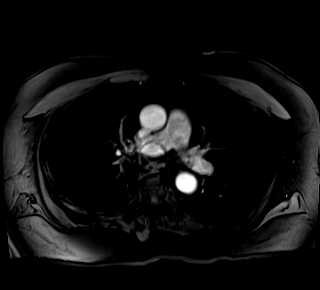

[Series 19: T1 dynamic · axial · 3.0mm · 1.19mm/px · z∈[-236,+25]mm · 3 of 88 slices shown (2 of 5)]
[im 1/88]
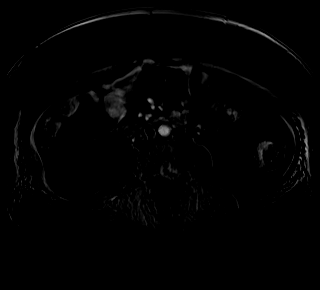
[im 44/88]
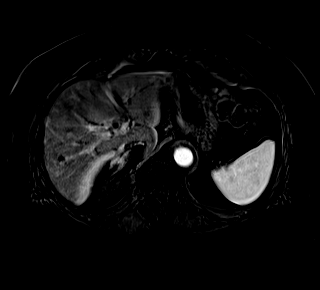
[im 88/88]
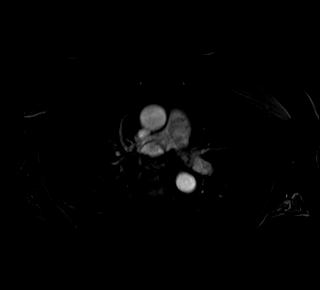

[Series 20: T1 dynamic post-contrast · axial · 3.0mm · 1.19mm/px · z∈[-236,+25]mm · 3 of 88 slices shown (2 of 5)]
[im 1/88]
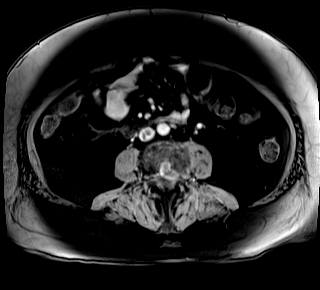
[im 44/88]
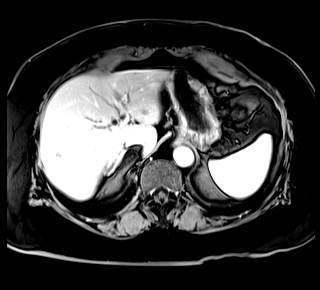
[im 88/88]
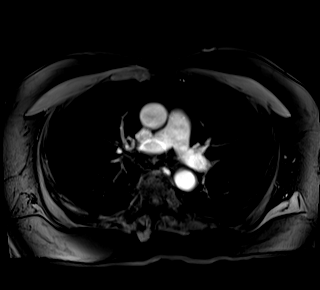

[Series 21: T1 dynamic · axial · 3.0mm · 1.19mm/px · z∈[-236,+25]mm · 3 of 88 slices shown (3 of 5)]
[im 1/88]
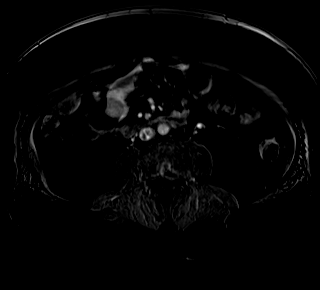
[im 44/88]
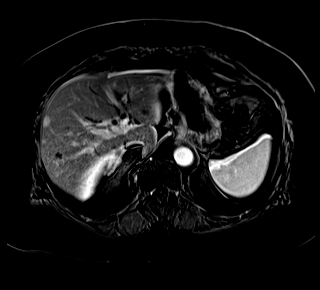
[im 88/88]
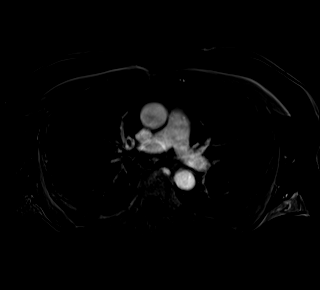

[Series 22: T1 dynamic post-contrast · axial · 3.0mm · 1.19mm/px · z∈[-236,+25]mm · 3 of 88 slices shown (3 of 5)]
[im 1/88]
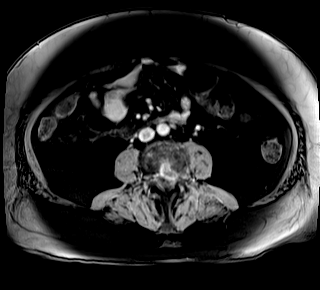
[im 44/88]
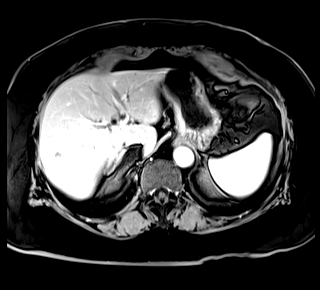
[im 88/88]
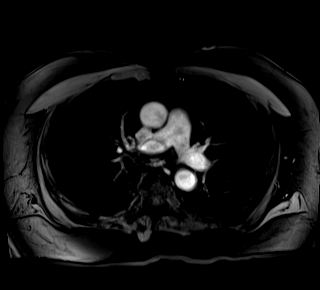

[Series 23: T1 dynamic · axial · 3.0mm · 1.19mm/px · z∈[-236,+25]mm · 3 of 88 slices shown (4 of 5)]
[im 1/88]
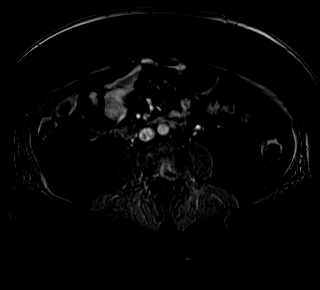
[im 44/88]
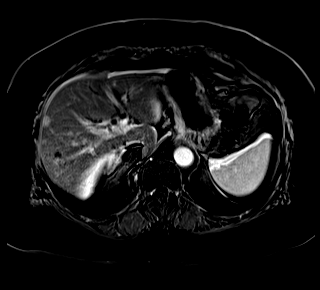
[im 88/88]
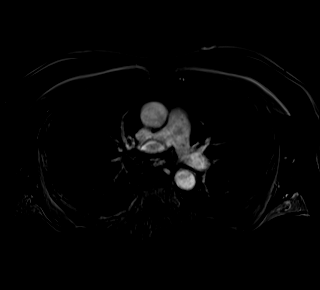

[Series 24: T1 dynamic post-contrast · axial · 3.0mm · 1.19mm/px · z∈[-236,+25]mm · 3 of 88 slices shown (4 of 5)]
[im 1/88]
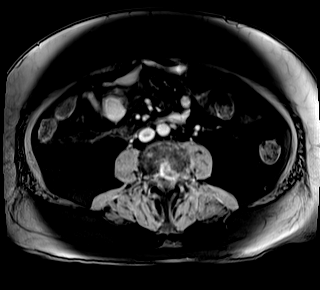
[im 44/88]
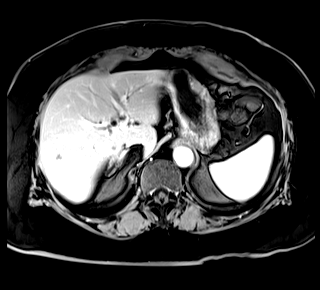
[im 88/88]
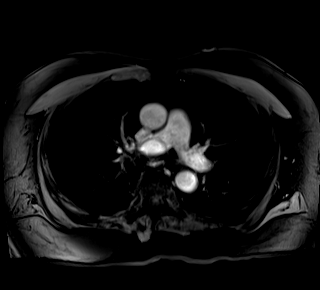

[Series 25: T1 dynamic · axial · 3.0mm · 1.19mm/px · z∈[-236,+25]mm · 3 of 88 slices shown (5 of 5)]
[im 1/88]
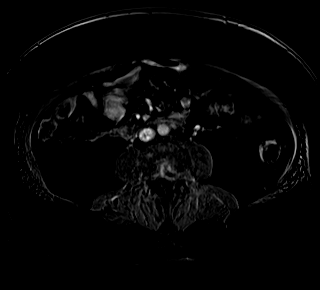
[im 44/88]
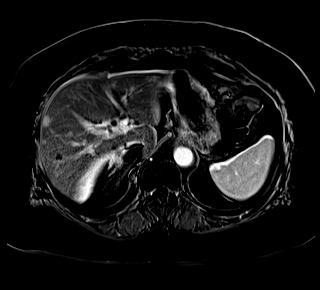
[im 88/88]
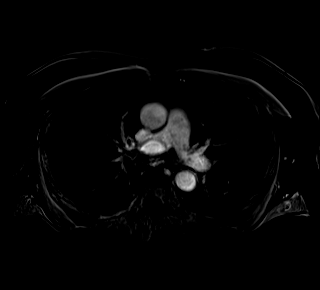

[Series 26: T1 dynamic post-contrast · coronal · 3.0mm · 1.31mm/px · 3 of 96 slices shown (5 of 5)]
[im 1/96]
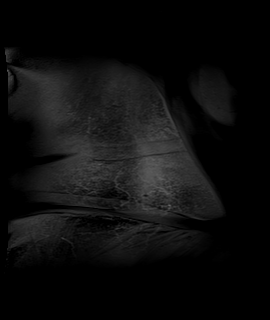
[im 48/96]
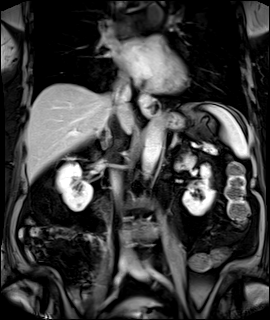
[im 96/96]
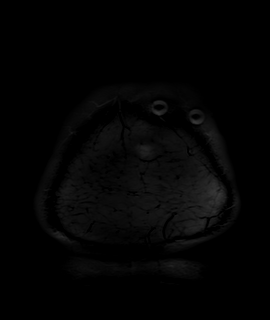

[43 of 48 positions shown; findings below may reference images not displayed]

FINDINGS: Lower chest: Normal heart size without pericardial or pleural
effusion. Tiny hiatal hernia. Left lower lobe pulmonary nodules
including on series 26 are not well evaluated.

Hepatobiliary: Bilateral liver lesions which are most consistent
with metastasis. Example in the left hepatic lobe at 1.0 cm on
44/20. In the posterior right hepatic lobe at 11 mm on image 35/20.

Small gallstones including on [DATE]. Gallbladder distension but no
specific evidence of acute cholecystitis.

Mild intrahepatic biliary duct dilatation. The common duct measures
12 mm on 34/12. No choledocholithiasis.

Pancreas: Atrophy with main duct and side branch dilatation
throughout the body, neck, tail. This continues to the level of a
pancreatic head mass which measures 4.4 x 4.0 cm on image 66/20. No
superimposed pancreatitis.

Spleen:  Normal in size, without focal abnormality.

Adrenals/Urinary Tract: Normal left adrenal gland. Minimal right
adrenal nodularity. This consistent with an adenoma of 8 mm on 46/9.

Bilateral renal cysts. An upper pole left renal lesion measures
cm and demonstrates precontrast heterogeneous T1 signal. Equivocal
post-contrast enhancement, including on subtracted image 54/25.

Stomach/Bowel: Normal distal stomach and abdominal bowel loops.

Vascular/Lymphatic: Aortic atherosclerosis. There is involvement of
the superior mesenteric vein and splenoportal confluence by tumor,
including on 65/20. No abdominal adenopathy.

Other:  No ascites.  No evidence of omental or peritoneal disease.

Musculoskeletal: No acute osseous abnormality.
IMPRESSION: 1. Pancreatic head mass is consistent with adenocarcinoma. This
causes biliary and pancreatic duct dilatation.
2. Bilateral hepatic metastasis.
3. Cholelithiasis without evidence of acute cholecystitis.
4. Upper pole left renal lesion is indeterminate and could represent
a complex cyst or a mildly enhancing renal cell carcinoma. Of
questionable clinical significance given age and comorbidities.
5. Left lung base nodules are suboptimally evaluated. If complete
staging is desired, consider chest CT.
6. Right adrenal adenoma.

## 2020-08-09 IMAGING — RF ERCP
1 series · 3 of 3 positions shown · non-contrast
Comparison: MRCP from the previous day

CLINICAL DATA: Obstructive jaundice

EXAM:
ERCP
TECHNIQUE: Multiple spot images obtained with the fluoroscopic device and
submitted for interpretation post-procedure.

[Series 1: unknown protocol · 0.20mm/px · 3 of 3 slices shown]
[im 1/3]
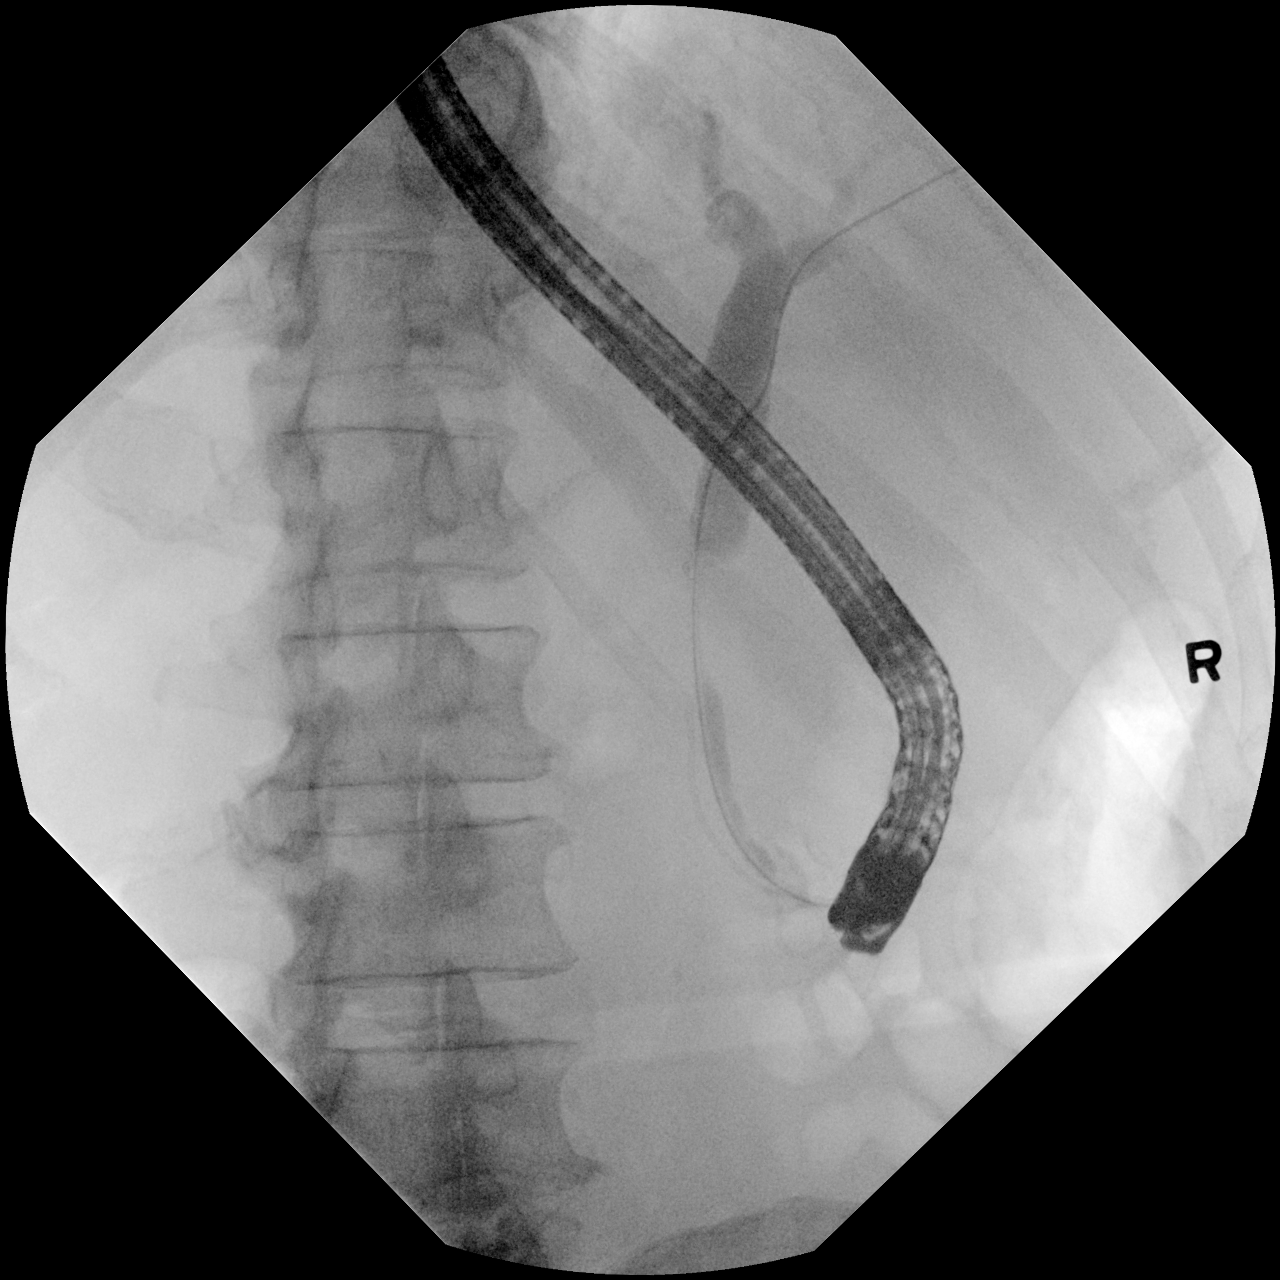
[im 2/3]
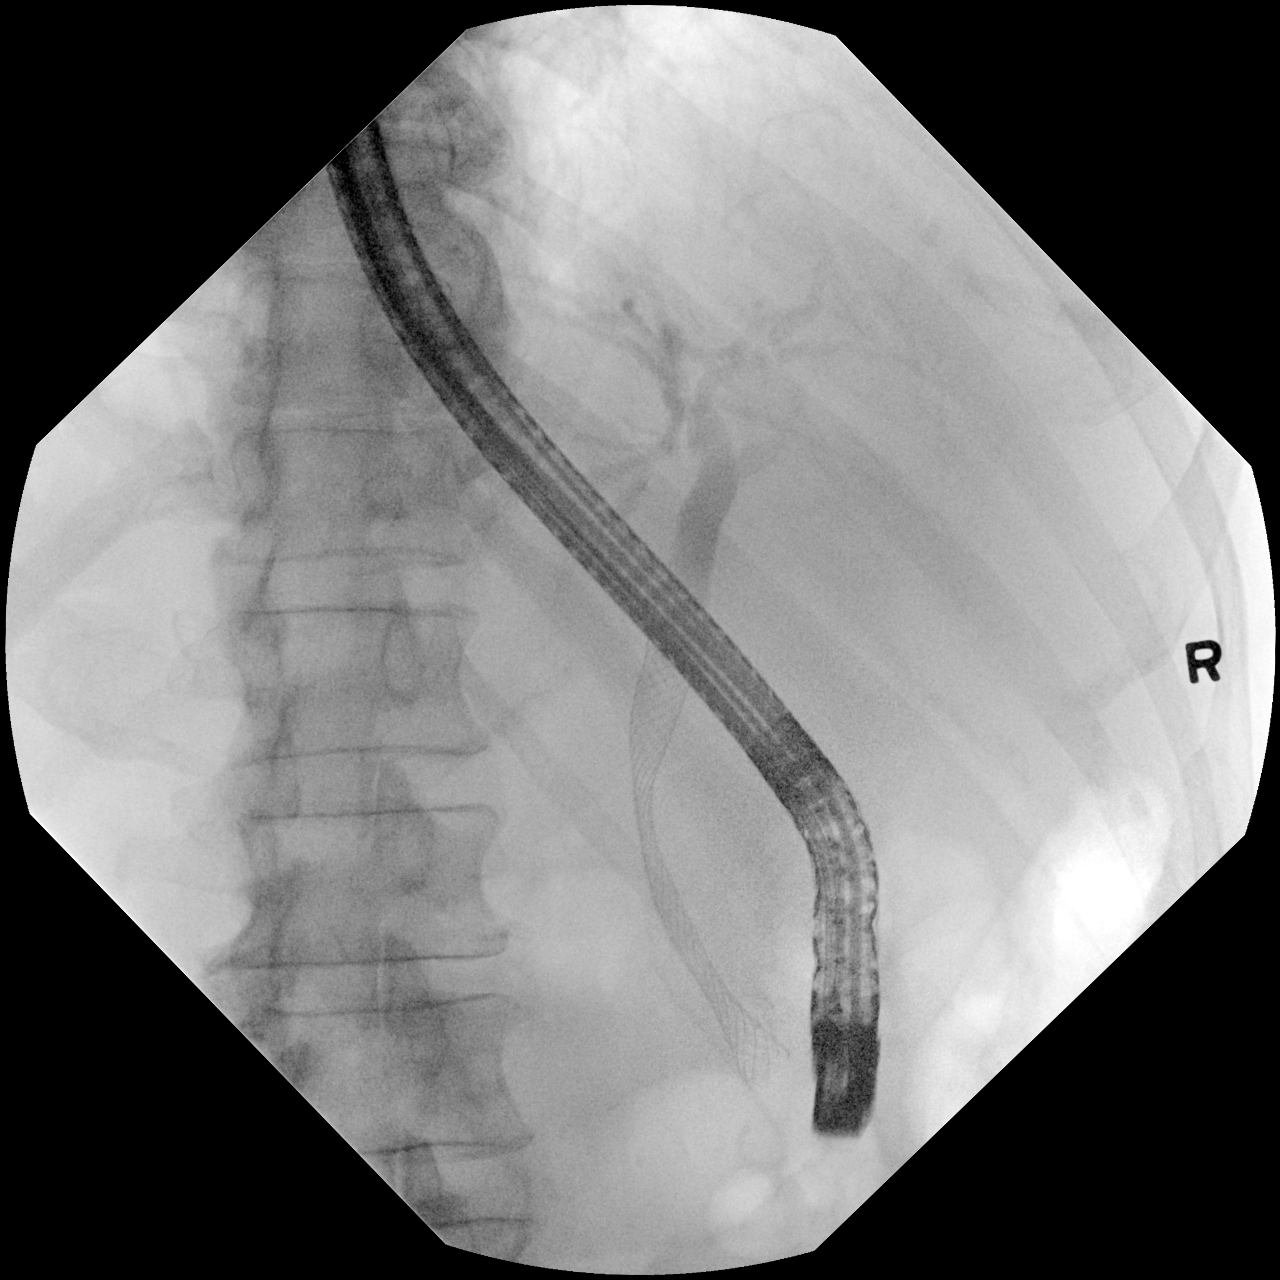
[im 3/3]
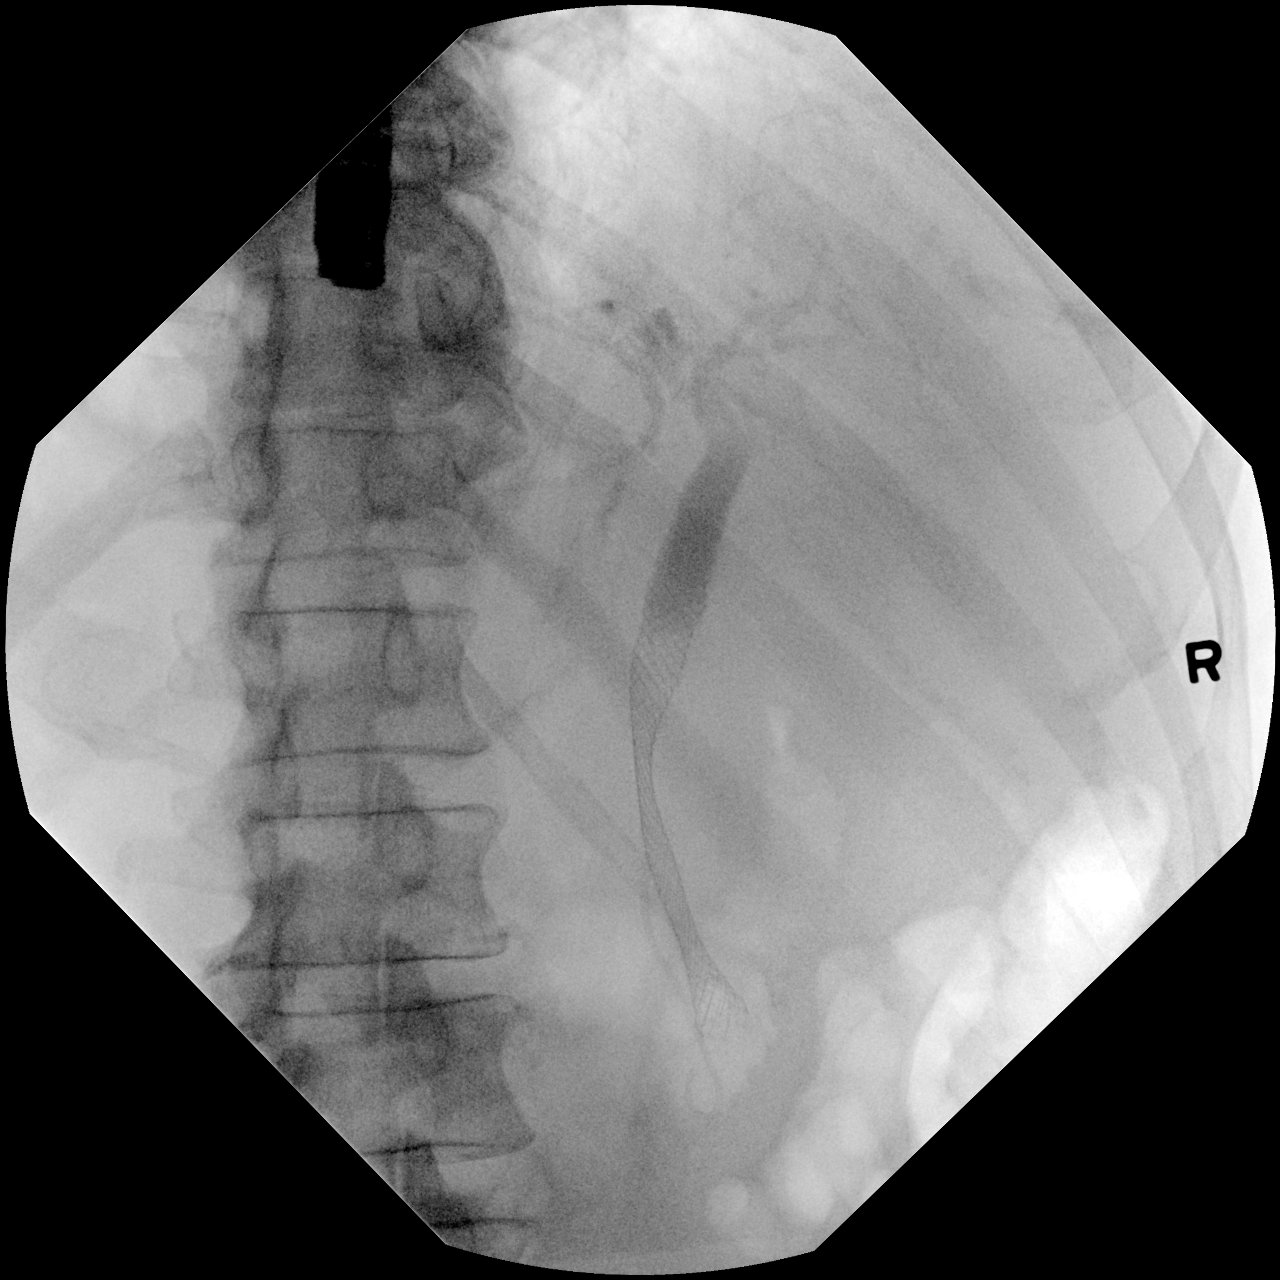

[3 of 3 positions shown; findings below may reference images not displayed]

FINDINGS: Series of fluoroscopic spot images document endoscopic cannulation
and opacification of the CBD. A plastic pancreatic duct stent is
noted. There is an occluded segment in the mid CBD, mildly distended
proximally, decompressed distally. Limited opacification of the
intrahepatic biliary tree. Subsequent images document placement of a
metallic biliary stent across the lesion.
IMPRESSION: Endoscopic CBD cannulation and intervention with metallic stent
placement.

These images were submitted for radiologic interpretation only.
Please see the procedural report for the amount of contrast and the
fluoroscopy time utilized.

## 2021-07-28 NOTE — Telephone Encounter (Signed)
ERROR
